# Patient Record
Sex: Female | Born: 1935
Health system: Southern US, Community
[De-identification: ages and names within clinical notes are randomized; demographics above are authoritative.]

## PROBLEM LIST (undated history)

## (undated) DIAGNOSIS — J329 Chronic sinusitis, unspecified: Secondary | ICD-10-CM

## (undated) DIAGNOSIS — M1990 Unspecified osteoarthritis, unspecified site: Secondary | ICD-10-CM

## (undated) DIAGNOSIS — K5900 Constipation, unspecified: Secondary | ICD-10-CM

## (undated) DIAGNOSIS — E785 Hyperlipidemia, unspecified: Secondary | ICD-10-CM

## (undated) DIAGNOSIS — G8929 Other chronic pain: Secondary | ICD-10-CM

## (undated) DIAGNOSIS — Z8619 Personal history of other infectious and parasitic diseases: Secondary | ICD-10-CM

## (undated) DIAGNOSIS — R413 Other amnesia: Secondary | ICD-10-CM

## (undated) DIAGNOSIS — R42 Dizziness and giddiness: Secondary | ICD-10-CM

## (undated) DIAGNOSIS — I10 Essential (primary) hypertension: Secondary | ICD-10-CM

## (undated) DIAGNOSIS — R51 Headache: Secondary | ICD-10-CM

## (undated) DIAGNOSIS — I209 Angina pectoris, unspecified: Secondary | ICD-10-CM

## (undated) DIAGNOSIS — T7840XA Allergy, unspecified, initial encounter: Secondary | ICD-10-CM

## (undated) HISTORY — DX: Other amnesia: R41.3

## (undated) HISTORY — DX: Essential (primary) hypertension: I10

## (undated) HISTORY — DX: Unspecified osteoarthritis, unspecified site: M19.90

## (undated) HISTORY — DX: Headache: R51

## (undated) HISTORY — DX: Constipation, unspecified: K59.00

## (undated) HISTORY — DX: Dizziness and giddiness: R42

## (undated) HISTORY — DX: Hyperlipidemia, unspecified: E78.5

## (undated) HISTORY — DX: Personal history of other infectious and parasitic diseases: Z86.19

## (undated) HISTORY — DX: Allergy, unspecified, initial encounter: T78.40XA

---

## 1968-11-19 HISTORY — PX: ABDOMINAL HYSTERECTOMY: SHX81

## 1998-05-27 ENCOUNTER — Other Ambulatory Visit: Admission: RE | Admit: 1998-05-27 | Discharge: 1998-05-27 | Payer: Self-pay | Admitting: Obstetrics and Gynecology

## 1998-09-14 ENCOUNTER — Ambulatory Visit (HOSPITAL_COMMUNITY): Admission: RE | Admit: 1998-09-14 | Discharge: 1998-09-14 | Payer: Self-pay | Admitting: Internal Medicine

## 1998-09-14 ENCOUNTER — Encounter: Payer: Self-pay | Admitting: Internal Medicine

## 1999-05-31 ENCOUNTER — Other Ambulatory Visit: Admission: RE | Admit: 1999-05-31 | Discharge: 1999-05-31 | Payer: Self-pay | Admitting: Obstetrics and Gynecology

## 2000-05-29 ENCOUNTER — Other Ambulatory Visit: Admission: RE | Admit: 2000-05-29 | Discharge: 2000-05-29 | Payer: Self-pay | Admitting: Internal Medicine

## 2001-06-15 ENCOUNTER — Emergency Department (HOSPITAL_COMMUNITY): Admission: EM | Admit: 2001-06-15 | Discharge: 2001-06-15 | Payer: Self-pay | Admitting: Emergency Medicine

## 2002-02-21 ENCOUNTER — Encounter: Admission: RE | Admit: 2002-02-21 | Discharge: 2002-02-21 | Payer: Self-pay | Admitting: Internal Medicine

## 2002-02-21 ENCOUNTER — Encounter: Payer: Self-pay | Admitting: Internal Medicine

## 2002-10-09 ENCOUNTER — Other Ambulatory Visit: Admission: RE | Admit: 2002-10-09 | Discharge: 2002-10-09 | Payer: Self-pay | Admitting: Internal Medicine

## 2002-12-09 ENCOUNTER — Encounter: Payer: Self-pay | Admitting: Internal Medicine

## 2002-12-09 LAB — HM COLONOSCOPY: HM Colonoscopy: NORMAL

## 2003-02-05 ENCOUNTER — Encounter: Admission: RE | Admit: 2003-02-05 | Discharge: 2003-02-05 | Payer: Self-pay | Admitting: Internal Medicine

## 2003-11-27 ENCOUNTER — Encounter: Payer: Self-pay | Admitting: Internal Medicine

## 2003-12-29 ENCOUNTER — Emergency Department (HOSPITAL_COMMUNITY): Admission: EM | Admit: 2003-12-29 | Discharge: 2003-12-29 | Payer: Self-pay | Admitting: Emergency Medicine

## 2004-01-19 ENCOUNTER — Ambulatory Visit: Admission: RE | Admit: 2004-01-19 | Discharge: 2004-01-19 | Payer: Self-pay | Admitting: Internal Medicine

## 2004-01-29 ENCOUNTER — Ambulatory Visit: Payer: Self-pay | Admitting: Internal Medicine

## 2004-02-02 ENCOUNTER — Ambulatory Visit: Payer: Self-pay | Admitting: Internal Medicine

## 2004-02-06 ENCOUNTER — Encounter: Admission: RE | Admit: 2004-02-06 | Discharge: 2004-03-04 | Payer: Self-pay | Admitting: Internal Medicine

## 2004-04-01 ENCOUNTER — Ambulatory Visit: Payer: Self-pay | Admitting: Internal Medicine

## 2004-04-12 ENCOUNTER — Ambulatory Visit: Payer: Self-pay | Admitting: Internal Medicine

## 2004-05-14 ENCOUNTER — Ambulatory Visit: Payer: Self-pay | Admitting: Internal Medicine

## 2004-07-09 ENCOUNTER — Ambulatory Visit: Payer: Self-pay | Admitting: Internal Medicine

## 2004-07-15 ENCOUNTER — Ambulatory Visit: Payer: Self-pay

## 2004-11-24 ENCOUNTER — Ambulatory Visit: Payer: Self-pay | Admitting: Internal Medicine

## 2004-12-09 ENCOUNTER — Ambulatory Visit: Payer: Self-pay | Admitting: Internal Medicine

## 2004-12-09 ENCOUNTER — Ambulatory Visit: Payer: Self-pay | Admitting: Cardiology

## 2004-12-16 ENCOUNTER — Ambulatory Visit: Payer: Self-pay | Admitting: Internal Medicine

## 2004-12-28 ENCOUNTER — Ambulatory Visit: Payer: Self-pay | Admitting: Internal Medicine

## 2005-02-07 ENCOUNTER — Ambulatory Visit (HOSPITAL_COMMUNITY): Admission: RE | Admit: 2005-02-07 | Discharge: 2005-02-07 | Payer: Self-pay | Admitting: Internal Medicine

## 2005-02-16 ENCOUNTER — Ambulatory Visit: Payer: Self-pay | Admitting: Internal Medicine

## 2005-04-01 ENCOUNTER — Ambulatory Visit (HOSPITAL_COMMUNITY): Admission: RE | Admit: 2005-04-01 | Discharge: 2005-04-01 | Payer: Self-pay | Admitting: Internal Medicine

## 2005-04-01 ENCOUNTER — Ambulatory Visit: Payer: Self-pay | Admitting: Internal Medicine

## 2005-05-02 ENCOUNTER — Ambulatory Visit: Payer: Self-pay | Admitting: Internal Medicine

## 2005-05-05 ENCOUNTER — Ambulatory Visit: Payer: Self-pay | Admitting: Internal Medicine

## 2005-05-13 ENCOUNTER — Ambulatory Visit: Payer: Self-pay | Admitting: Internal Medicine

## 2005-07-25 ENCOUNTER — Encounter: Payer: Self-pay | Admitting: Internal Medicine

## 2005-07-25 ENCOUNTER — Ambulatory Visit: Payer: Self-pay | Admitting: Internal Medicine

## 2005-08-09 ENCOUNTER — Ambulatory Visit: Payer: Self-pay | Admitting: Internal Medicine

## 2005-12-15 ENCOUNTER — Ambulatory Visit: Payer: Self-pay | Admitting: Internal Medicine

## 2005-12-23 ENCOUNTER — Ambulatory Visit: Payer: Self-pay | Admitting: Internal Medicine

## 2006-01-12 ENCOUNTER — Ambulatory Visit: Payer: Self-pay | Admitting: Internal Medicine

## 2006-02-03 ENCOUNTER — Ambulatory Visit: Payer: Self-pay | Admitting: Internal Medicine

## 2006-04-12 ENCOUNTER — Ambulatory Visit: Payer: Self-pay | Admitting: Internal Medicine

## 2006-05-18 ENCOUNTER — Ambulatory Visit: Payer: Self-pay | Admitting: Endocrinology

## 2006-05-19 ENCOUNTER — Encounter: Payer: Self-pay | Admitting: Internal Medicine

## 2006-05-19 ENCOUNTER — Encounter: Admission: RE | Admit: 2006-05-19 | Discharge: 2006-05-19 | Payer: Self-pay | Admitting: Endocrinology

## 2006-06-13 ENCOUNTER — Ambulatory Visit: Payer: Self-pay | Admitting: Gastroenterology

## 2006-07-11 ENCOUNTER — Ambulatory Visit: Payer: Self-pay | Admitting: Internal Medicine

## 2006-07-25 ENCOUNTER — Ambulatory Visit (HOSPITAL_COMMUNITY): Admission: RE | Admit: 2006-07-25 | Discharge: 2006-07-25 | Payer: Self-pay | Admitting: Internal Medicine

## 2006-08-01 ENCOUNTER — Emergency Department (HOSPITAL_COMMUNITY): Admission: EM | Admit: 2006-08-01 | Discharge: 2006-08-01 | Payer: Self-pay | Admitting: Emergency Medicine

## 2006-08-02 ENCOUNTER — Ambulatory Visit (HOSPITAL_COMMUNITY): Admission: RE | Admit: 2006-08-02 | Discharge: 2006-08-02 | Payer: Self-pay | Admitting: Emergency Medicine

## 2006-09-08 ENCOUNTER — Ambulatory Visit: Payer: Self-pay | Admitting: Internal Medicine

## 2006-09-11 ENCOUNTER — Ambulatory Visit: Payer: Self-pay | Admitting: Internal Medicine

## 2006-10-07 DIAGNOSIS — I10 Essential (primary) hypertension: Secondary | ICD-10-CM

## 2006-10-07 DIAGNOSIS — E785 Hyperlipidemia, unspecified: Secondary | ICD-10-CM | POA: Insufficient documentation

## 2006-10-07 DIAGNOSIS — M25559 Pain in unspecified hip: Secondary | ICD-10-CM

## 2006-10-12 ENCOUNTER — Ambulatory Visit: Payer: Self-pay | Admitting: Internal Medicine

## 2006-11-15 LAB — CONVERTED CEMR LAB: Pap Smear: NORMAL

## 2007-01-02 ENCOUNTER — Ambulatory Visit: Payer: Self-pay | Admitting: Internal Medicine

## 2007-01-09 ENCOUNTER — Encounter: Payer: Self-pay | Admitting: Internal Medicine

## 2007-01-09 ENCOUNTER — Ambulatory Visit: Payer: Self-pay | Admitting: Internal Medicine

## 2007-01-17 ENCOUNTER — Encounter: Payer: Self-pay | Admitting: Internal Medicine

## 2007-01-17 ENCOUNTER — Ambulatory Visit: Payer: Self-pay

## 2007-03-13 ENCOUNTER — Ambulatory Visit: Payer: Self-pay | Admitting: Internal Medicine

## 2007-03-13 DIAGNOSIS — M1990 Unspecified osteoarthritis, unspecified site: Secondary | ICD-10-CM

## 2007-07-11 ENCOUNTER — Ambulatory Visit: Payer: Self-pay | Admitting: Internal Medicine

## 2007-07-11 DIAGNOSIS — R1032 Left lower quadrant pain: Secondary | ICD-10-CM

## 2007-07-11 LAB — CONVERTED CEMR LAB
ALT: 16 units/L (ref 0–35)
Albumin: 3.8 g/dL (ref 3.5–5.2)
Bilirubin, Direct: 0.1 mg/dL (ref 0.0–0.3)
Calcium: 9.5 mg/dL (ref 8.4–10.5)
Chloride: 105 meq/L (ref 96–112)
Eosinophils Absolute: 0.2 10*3/uL (ref 0.0–0.7)
GFR calc Af Amer: 70 mL/min
GFR calc non Af Amer: 58 mL/min
HCT: 39.8 % (ref 36.0–46.0)
Hemoglobin: 13.6 g/dL (ref 12.0–15.0)
MCHC: 34 g/dL (ref 30.0–36.0)
MCV: 90.2 fL (ref 78.0–100.0)
Monocytes Absolute: 0.4 10*3/uL (ref 0.1–1.0)
Monocytes Relative: 8.6 % (ref 3.0–12.0)
Neutrophils Relative %: 56.6 % (ref 43.0–77.0)
Platelets: 271 10*3/uL (ref 150–400)
Potassium: 4.4 meq/L (ref 3.5–5.1)
Total Protein, Urine: NEGATIVE mg/dL
Urine Glucose: NEGATIVE mg/dL
pH: 6 (ref 5.0–8.0)

## 2007-07-13 ENCOUNTER — Ambulatory Visit: Payer: Self-pay | Admitting: Internal Medicine

## 2007-08-08 ENCOUNTER — Ambulatory Visit: Payer: Self-pay | Admitting: Internal Medicine

## 2007-09-10 ENCOUNTER — Encounter: Admission: RE | Admit: 2007-09-10 | Discharge: 2007-09-10 | Payer: Self-pay | Admitting: Obstetrics and Gynecology

## 2007-10-02 ENCOUNTER — Ambulatory Visit: Payer: Self-pay | Admitting: Internal Medicine

## 2007-10-09 ENCOUNTER — Ambulatory Visit: Payer: Self-pay | Admitting: Internal Medicine

## 2007-10-09 LAB — CONVERTED CEMR LAB
CO2: 27 meq/L (ref 19–32)
CRP, High Sensitivity: 5 (ref 0.00–5.00)
Calcium: 9.3 mg/dL (ref 8.4–10.5)
Cholesterol: 276 mg/dL (ref 0–200)
Creatinine, Ser: 1.1 mg/dL (ref 0.4–1.2)
Direct LDL: 175.9 mg/dL
GFR calc Af Amer: 63 mL/min
GFR calc non Af Amer: 52 mL/min
Glucose, Bld: 97 mg/dL (ref 70–99)
VLDL: 63 mg/dL — ABNORMAL HIGH (ref 0–40)

## 2007-10-10 ENCOUNTER — Telehealth: Payer: Self-pay | Admitting: Internal Medicine

## 2007-10-24 ENCOUNTER — Ambulatory Visit: Payer: Self-pay | Admitting: Internal Medicine

## 2007-11-27 ENCOUNTER — Ambulatory Visit: Payer: Self-pay | Admitting: Internal Medicine

## 2007-12-06 ENCOUNTER — Ambulatory Visit: Payer: Self-pay | Admitting: Internal Medicine

## 2007-12-26 LAB — CONVERTED CEMR LAB

## 2008-01-01 ENCOUNTER — Ambulatory Visit: Payer: Self-pay | Admitting: Internal Medicine

## 2008-01-01 LAB — CONVERTED CEMR LAB
ALT: 17 units/L (ref 0–35)
AST: 23 units/L (ref 0–37)
Total CHOL/HDL Ratio: 2.6
VLDL: 28 mg/dL (ref 0–40)

## 2008-01-08 ENCOUNTER — Ambulatory Visit: Payer: Self-pay | Admitting: Internal Medicine

## 2008-01-14 ENCOUNTER — Ambulatory Visit: Payer: Self-pay | Admitting: Internal Medicine

## 2008-01-23 ENCOUNTER — Encounter: Payer: Self-pay | Admitting: Internal Medicine

## 2008-01-30 ENCOUNTER — Ambulatory Visit: Payer: Self-pay | Admitting: *Deleted

## 2008-01-30 DIAGNOSIS — A088 Other specified intestinal infections: Secondary | ICD-10-CM

## 2008-01-30 DIAGNOSIS — L259 Unspecified contact dermatitis, unspecified cause: Secondary | ICD-10-CM

## 2008-02-04 ENCOUNTER — Ambulatory Visit: Payer: Self-pay | Admitting: Internal Medicine

## 2008-02-04 DIAGNOSIS — L738 Other specified follicular disorders: Secondary | ICD-10-CM | POA: Insufficient documentation

## 2008-02-04 DIAGNOSIS — R110 Nausea: Secondary | ICD-10-CM

## 2008-02-04 DIAGNOSIS — H9209 Otalgia, unspecified ear: Secondary | ICD-10-CM | POA: Insufficient documentation

## 2008-02-04 LAB — CONVERTED CEMR LAB
ALT: 20 units/L (ref 0–35)
AST: 24 units/L (ref 0–37)
Alkaline Phosphatase: 77 units/L (ref 39–117)
Basophils Absolute: 0.1 10*3/uL (ref 0.0–0.1)
Basophils Relative: 2.2 % (ref 0.0–3.0)
Chloride: 104 meq/L (ref 96–112)
Eosinophils Absolute: 0.2 10*3/uL (ref 0.0–0.7)
Eosinophils Relative: 2.7 % (ref 0.0–5.0)
Monocytes Absolute: 0.7 10*3/uL (ref 0.1–1.0)
Monocytes Relative: 10 % (ref 3.0–12.0)
Platelets: 250 10*3/uL (ref 150–400)
Potassium: 4.7 meq/L (ref 3.5–5.1)
Sodium: 139 meq/L (ref 135–145)
WBC: 6.8 10*3/uL (ref 4.5–10.5)

## 2008-02-05 ENCOUNTER — Telehealth: Payer: Self-pay | Admitting: Internal Medicine

## 2008-04-10 ENCOUNTER — Ambulatory Visit: Payer: Self-pay | Admitting: Internal Medicine

## 2008-04-10 DIAGNOSIS — N951 Menopausal and female climacteric states: Secondary | ICD-10-CM

## 2008-05-19 LAB — CONVERTED CEMR LAB

## 2008-06-03 ENCOUNTER — Ambulatory Visit: Payer: Self-pay | Admitting: Internal Medicine

## 2008-06-03 DIAGNOSIS — R42 Dizziness and giddiness: Secondary | ICD-10-CM | POA: Insufficient documentation

## 2008-06-11 ENCOUNTER — Ambulatory Visit: Payer: Self-pay | Admitting: Internal Medicine

## 2008-06-24 ENCOUNTER — Ambulatory Visit: Payer: Self-pay | Admitting: Internal Medicine

## 2008-06-26 ENCOUNTER — Encounter: Admission: RE | Admit: 2008-06-26 | Discharge: 2008-09-24 | Payer: Self-pay | Admitting: Internal Medicine

## 2008-07-02 ENCOUNTER — Telehealth: Payer: Self-pay | Admitting: Internal Medicine

## 2008-07-02 ENCOUNTER — Ambulatory Visit (HOSPITAL_BASED_OUTPATIENT_CLINIC_OR_DEPARTMENT_OTHER): Admission: RE | Admit: 2008-07-02 | Discharge: 2008-07-02 | Payer: Self-pay | Admitting: Internal Medicine

## 2008-07-02 ENCOUNTER — Ambulatory Visit: Payer: Self-pay | Admitting: Diagnostic Radiology

## 2008-07-03 ENCOUNTER — Encounter: Payer: Self-pay | Admitting: Internal Medicine

## 2008-07-04 ENCOUNTER — Encounter: Payer: Self-pay | Admitting: Internal Medicine

## 2008-07-07 ENCOUNTER — Encounter: Payer: Self-pay | Admitting: Internal Medicine

## 2008-07-22 ENCOUNTER — Ambulatory Visit: Payer: Self-pay | Admitting: Internal Medicine

## 2008-09-04 ENCOUNTER — Ambulatory Visit: Payer: Self-pay | Admitting: Internal Medicine

## 2008-09-04 ENCOUNTER — Ambulatory Visit: Payer: Self-pay | Admitting: Diagnostic Radiology

## 2008-09-04 ENCOUNTER — Ambulatory Visit (HOSPITAL_BASED_OUTPATIENT_CLINIC_OR_DEPARTMENT_OTHER): Admission: RE | Admit: 2008-09-04 | Discharge: 2008-09-04 | Payer: Self-pay | Admitting: Internal Medicine

## 2008-09-04 DIAGNOSIS — J018 Other acute sinusitis: Secondary | ICD-10-CM

## 2008-10-16 ENCOUNTER — Ambulatory Visit: Payer: Self-pay | Admitting: Internal Medicine

## 2008-10-16 LAB — CONVERTED CEMR LAB
AST: 28 units/L (ref 0–37)
HDL: 66.8 mg/dL (ref >39.00)
LDL Cholesterol: 79 mg/dL (ref 0–99)
Vit D, 25-Hydroxy: 22 ng/mL — ABNORMAL LOW (ref 30–89)

## 2008-10-17 ENCOUNTER — Telehealth: Payer: Self-pay | Admitting: Internal Medicine

## 2008-12-05 ENCOUNTER — Encounter: Payer: Self-pay | Admitting: Internal Medicine

## 2008-12-12 ENCOUNTER — Encounter: Admission: RE | Admit: 2008-12-12 | Discharge: 2008-12-12 | Payer: Self-pay | Admitting: Internal Medicine

## 2008-12-12 LAB — HM MAMMOGRAPHY: HM Mammogram: NORMAL

## 2008-12-22 ENCOUNTER — Ambulatory Visit: Payer: Self-pay | Admitting: Internal Medicine

## 2008-12-22 DIAGNOSIS — M79609 Pain in unspecified limb: Secondary | ICD-10-CM

## 2009-01-08 ENCOUNTER — Encounter: Admission: RE | Admit: 2009-01-08 | Discharge: 2009-02-27 | Payer: Self-pay | Admitting: Internal Medicine

## 2009-01-19 ENCOUNTER — Encounter: Payer: Self-pay | Admitting: Internal Medicine

## 2009-02-05 ENCOUNTER — Encounter: Payer: Self-pay | Admitting: Internal Medicine

## 2009-03-27 ENCOUNTER — Telehealth: Payer: Self-pay | Admitting: Internal Medicine

## 2009-04-30 ENCOUNTER — Ambulatory Visit: Payer: Self-pay | Admitting: Internal Medicine

## 2009-05-06 ENCOUNTER — Telehealth: Payer: Self-pay | Admitting: Internal Medicine

## 2009-05-25 ENCOUNTER — Ambulatory Visit: Payer: Self-pay | Admitting: Internal Medicine

## 2009-05-25 DIAGNOSIS — R5381 Other malaise: Secondary | ICD-10-CM | POA: Insufficient documentation

## 2009-05-25 DIAGNOSIS — R5383 Other fatigue: Secondary | ICD-10-CM

## 2009-05-25 LAB — CONVERTED CEMR LAB
ALT: 13 units/L (ref 0–35)
BUN: 18 mg/dL (ref 6–23)
Basophils Absolute: 0 10*3/uL (ref 0.0–0.1)
Basophils Relative: 1 % (ref 0–1)
Bilirubin, Direct: 0.1 mg/dL (ref 0.0–0.3)
Eosinophils Absolute: 0.2 10*3/uL (ref 0.0–0.7)
Glucose, Bld: 111 mg/dL — ABNORMAL HIGH (ref 70–99)
HCT: 42.8 % (ref 36.0–46.0)
Hemoglobin: 14 g/dL (ref 12.0–15.0)
Indirect Bilirubin: 0.3 mg/dL (ref 0.0–0.9)
Lymphs Abs: 1.8 10*3/uL (ref 0.7–4.0)
MCHC: 32.7 g/dL (ref 30.0–36.0)
Monocytes Relative: 9 % (ref 3–12)
Platelets: 260 10*3/uL (ref 150–400)
RDW: 13 % (ref 11.5–15.5)
Sodium: 140 meq/L (ref 135–145)
TSH: 2.681 microintl units/mL (ref 0.350–4.500)
Total Bilirubin: 0.4 mg/dL (ref 0.3–1.2)
WBC: 6.3 10*3/uL (ref 4.0–10.5)

## 2009-05-26 ENCOUNTER — Telehealth: Payer: Self-pay | Admitting: Internal Medicine

## 2009-05-26 ENCOUNTER — Encounter: Payer: Self-pay | Admitting: Internal Medicine

## 2009-06-15 ENCOUNTER — Ambulatory Visit: Payer: Self-pay | Admitting: Internal Medicine

## 2009-06-29 ENCOUNTER — Encounter: Admission: RE | Admit: 2009-06-29 | Discharge: 2009-07-21 | Payer: Self-pay | Admitting: Internal Medicine

## 2009-07-15 ENCOUNTER — Encounter: Payer: Self-pay | Admitting: Internal Medicine

## 2009-07-16 ENCOUNTER — Ambulatory Visit: Payer: Self-pay | Admitting: Internal Medicine

## 2009-07-16 DIAGNOSIS — R0609 Other forms of dyspnea: Secondary | ICD-10-CM | POA: Insufficient documentation

## 2009-07-16 DIAGNOSIS — R0989 Other specified symptoms and signs involving the circulatory and respiratory systems: Secondary | ICD-10-CM

## 2009-07-20 ENCOUNTER — Encounter: Payer: Self-pay | Admitting: Internal Medicine

## 2009-07-27 LAB — CONVERTED CEMR LAB: Pap Smear: NORMAL

## 2009-07-29 ENCOUNTER — Encounter: Payer: Self-pay | Admitting: Internal Medicine

## 2009-08-10 ENCOUNTER — Ambulatory Visit: Payer: Self-pay | Admitting: Cardiology

## 2009-08-10 ENCOUNTER — Telehealth: Payer: Self-pay | Admitting: Internal Medicine

## 2009-08-10 ENCOUNTER — Encounter: Payer: Self-pay | Admitting: Internal Medicine

## 2009-08-10 ENCOUNTER — Ambulatory Visit (HOSPITAL_COMMUNITY): Admission: RE | Admit: 2009-08-10 | Discharge: 2009-08-10 | Payer: Self-pay | Admitting: Internal Medicine

## 2009-08-10 ENCOUNTER — Ambulatory Visit: Payer: Self-pay

## 2009-08-24 ENCOUNTER — Ambulatory Visit: Payer: Self-pay | Admitting: Internal Medicine

## 2009-09-30 ENCOUNTER — Ambulatory Visit: Payer: Self-pay | Admitting: Internal Medicine

## 2009-09-30 DIAGNOSIS — H5310 Unspecified subjective visual disturbances: Secondary | ICD-10-CM | POA: Insufficient documentation

## 2009-10-05 ENCOUNTER — Telehealth: Payer: Self-pay | Admitting: Internal Medicine

## 2009-11-17 ENCOUNTER — Encounter: Payer: Self-pay | Admitting: Internal Medicine

## 2010-02-22 ENCOUNTER — Ambulatory Visit: Payer: Self-pay | Admitting: Internal Medicine

## 2010-02-22 ENCOUNTER — Encounter: Payer: Self-pay | Admitting: Internal Medicine

## 2010-02-22 DIAGNOSIS — R0789 Other chest pain: Secondary | ICD-10-CM | POA: Insufficient documentation

## 2010-03-30 ENCOUNTER — Telehealth: Payer: Self-pay | Admitting: Internal Medicine

## 2010-04-01 ENCOUNTER — Ambulatory Visit
Admission: RE | Admit: 2010-04-01 | Discharge: 2010-04-01 | Payer: Self-pay | Source: Home / Self Care | Attending: Internal Medicine | Admitting: Internal Medicine

## 2010-04-01 ENCOUNTER — Encounter: Payer: Self-pay | Admitting: Internal Medicine

## 2010-04-01 DIAGNOSIS — R7309 Other abnormal glucose: Secondary | ICD-10-CM | POA: Insufficient documentation

## 2010-04-01 DIAGNOSIS — S335XXA Sprain of ligaments of lumbar spine, initial encounter: Secondary | ICD-10-CM | POA: Insufficient documentation

## 2010-04-01 DIAGNOSIS — R635 Abnormal weight gain: Secondary | ICD-10-CM | POA: Insufficient documentation

## 2010-04-01 LAB — CONVERTED CEMR LAB
ALT: 12 units/L (ref 0–35)
Albumin: 3.8 g/dL (ref 3.5–5.2)
BUN: 17 mg/dL (ref 6–23)
Chloride: 105 meq/L (ref 96–112)
Cholesterol: 265 mg/dL — ABNORMAL HIGH (ref 0–200)
HDL: 63 mg/dL (ref >39)
LDL Cholesterol: 156 mg/dL — ABNORMAL HIGH (ref 0–99)
Potassium: 4.4 meq/L (ref 3.5–5.3)
Sodium: 141 meq/L (ref 135–145)
TSH: 2.04 microintl units/mL (ref 0.350–4.500)
Total CHOL/HDL Ratio: 4.2
Total Protein: 7.4 g/dL (ref 6.0–8.3)
Triglycerides: 228 mg/dL — ABNORMAL HIGH (ref <150)
VLDL: 46 mg/dL — ABNORMAL HIGH (ref 0–40)

## 2010-04-05 ENCOUNTER — Telehealth: Payer: Self-pay | Admitting: Internal Medicine

## 2010-04-08 ENCOUNTER — Emergency Department (HOSPITAL_COMMUNITY)
Admission: EM | Admit: 2010-04-08 | Discharge: 2010-04-08 | Payer: Self-pay | Source: Home / Self Care | Admitting: Emergency Medicine

## 2010-04-10 ENCOUNTER — Encounter: Payer: Self-pay | Admitting: Internal Medicine

## 2010-04-11 ENCOUNTER — Encounter: Payer: Self-pay | Admitting: Emergency Medicine

## 2010-04-12 ENCOUNTER — Encounter: Payer: Self-pay | Admitting: Internal Medicine

## 2010-04-12 LAB — URINALYSIS, ROUTINE W REFLEX MICROSCOPIC
Bilirubin Urine: NEGATIVE
Hgb urine dipstick: NEGATIVE
Ketones, ur: NEGATIVE mg/dL
Urine Glucose, Fasting: NEGATIVE mg/dL
pH: 5 (ref 5.0–8.0)

## 2010-04-12 LAB — DIFFERENTIAL
Basophils Absolute: 0 10*3/uL (ref 0.0–0.1)
Basophils Relative: 0 % (ref 0–1)
Lymphocytes Relative: 42 % (ref 12–46)
Monocytes Absolute: 0.7 10*3/uL (ref 0.1–1.0)
Monocytes Relative: 10 % (ref 3–12)
Neutro Abs: 2.9 10*3/uL (ref 1.7–7.7)

## 2010-04-12 LAB — CBC
Hemoglobin: 12.5 g/dL (ref 12.0–15.0)
Platelets: 219 10*3/uL (ref 150–400)
RBC: 4.19 MIL/uL (ref 3.87–5.11)
RDW: 12.8 % (ref 11.5–15.5)

## 2010-04-12 LAB — BASIC METABOLIC PANEL
CO2: 23 mEq/L (ref 19–32)
Chloride: 103 mEq/L (ref 96–112)
GFR calc Af Amer: 58 mL/min — ABNORMAL LOW (ref >60)

## 2010-04-20 NOTE — Progress Notes (Signed)
  Phone Note Outgoing Call   Summary of Call: call pt - labs normal.   I will send letter summarizing results Initial call taken by: D. Thomos Lemons DO,  May 26, 2009 9:48 AM  Follow-up for Phone Call        informed patient that her blood work is normal. Pt. is still wondering what is making her have NO energy and feel SO drained? Call pt. back  Follow-up by: Michaelle Copas,  May 28, 2009 10:03 AM  Additional Follow-up for Phone Call Additional follow up Details #1::        symptoms may be from resolving viral infection.  If persistent fatigue after 2 wks, I suggest f/u OV Additional Follow-up by: D. Thomos Lemons DO,  June 01, 2009 8:23 AM    Additional Follow-up for Phone Call Additional follow up Details #2::    informed pt. that she could have no energy from her resolving viral infection and that if she is not feeling better in 2 weeks to come back in for a ov. Follow-up by: Michaelle Copas,  June 01, 2009 8:51 AM

## 2010-04-20 NOTE — Assessment & Plan Note (Signed)
Summary: dizzy ringing in ears/mhf   Vital Signs:  Patient profile:   77 year old female Weight:      164.50 pounds BMI:     29.25 O2 Sat:      100 % on Room air Temp:     97.3 degrees F oral Pulse rate:   60 / minute Pulse rhythm:   regular Resp:     18 per minute BP sitting:   140 / 70  (right arm) BP standing:   130 / 70  (right arm) Cuff size:   regular  Vitals Entered By: Glendell Docker CMA (June 15, 2009 3:45 PM)  O2 Flow:  Room air CC: Rm 3- Unresolved Dizziness and ringing in ears, dizziness in for initial evaluation Comments dizziness, nause and vomiting, ringing in ears, off balance, and no energy   Primary Care Provider:  DThomos Lemons DO  CC:  Rm 3- Unresolved Dizziness and ringing in ears and dizziness in for initial evaluation.  History of Present Illness: 2 days of dizzines,  bilateral ringing,  feels off balance       This is a 77 year old female who presents with dizziness x 2 days. .  She c/o izziness, ringing in ears, and imbalance.  The episodes of dizziness occur several times a day.  When the episodes occur, they last for several minutes.  The dizzness is associated with nausea, vomiting, and unsteady gait.   Her symptoms worse with head movement.   Evaluation and treatment to date has included ENT consult - Dr. Lazarus Salines.  She was prev also seen by vest rehab.  Dr. Lazarus Salines thought BP meds were contributing to dizziness  MRI of brain 07/02/2008 - Age related atrophy and mild chronic small vessel change.  No sign of acute or reversible process.  Allergies: 1)  ! Ampicillin 2)  Benicar (Olmesartan Medoxomil) 3)  Vicodin (Hydrocodone-Acetaminophen)  Past History:  Past Medical History: Hyperlipidemia Hypertension   Osteoarthritis         Vertigo  GYN Dr Payton Doughty Colon 2004 Dr Jarold Motto - nl     Past Surgical History: Hysterectomy, partial  COLONOSCOPY              Family History: F MM Family History Hypertension  Family History Lung cancer   M          Social History: Single - widow  Never Smoked     Alcohol use-no      Occupation: working for senior care  and personal care asst     Physical Exam  General:  alert, well-developed, and well-nourished.   Head:  normocephalic and atraumatic.   Eyes:  pupils equal, pupils round, and pupils reactive to light.  no nystagmus Lungs:  normal respiratory effort and normal breath sounds.   Heart:  normal rate, regular rhythm, no murmur, and no gallop.   Neurologic:  cranial nerves II-XII intact, gait normal, and finger-to-nose normal.     Impression & Recommendations:  Problem # 1:  INTERMITTENT VERTIGO (ICD-780.4) 77 y/o with intermittent vertigo.  symptoms worse with movement.  I suspect positional vertigo.  her BP meds could be contributing.  hold bystolic.  use low dose valium as needed.  increase fluid intake.  refer back to vestibular rehab.  If refractory symptoms, refer to ENT  Her updated medication list for this problem includes:    Ondansetron Hcl 4 Mg Tabs (Ondansetron hcl) ..... One by mouth two times a day as needed nausea  Orders: Misc. Referral (Misc. Ref)  Complete Medication List: 1)  Glucosamine 500 Mg Caps (Glucosamine sulfate) .Marland Kitchen.. 1 by mouth once daily 2)  Adult Aspirin Ec Low Strength 81 Mg Tbec (Aspirin) .Marland Kitchen.. 1 by mouth once daily 3)  Crestor 10 Mg Tabs (Rosuvastatin calcium) .... 1/2 tablets by mouth once daily 4)  Vitamin D 1000 Unit Caps (Cholecalciferol) .... Two by mouth qd 5)  Bystolic 5 Mg Tabs (Nebivolol hcl) .... Take 1/2  tablet by mouth once a day (hold) 6)  Vitamin C  .... 1 by mouth qd 7)  Ondansetron Hcl 4 Mg Tabs (Ondansetron hcl) .... One by mouth two times a day as needed nausea 8)  Fluticasone Propionate 50 Mcg/act Susp (Fluticasone propionate) .... 2 sprays each nostril once daily 9)  B Complex-b12 Tabs (B complex vitamins) .... Take 1 tablet by mouth once a day 10)  Diazepam 2 Mg Tabs (Diazepam) .... One by mouth two times a day as  needed for dizziness  Patient Instructions: 1)  Please schedule a follow-up appointment in 1 month. 2)  Increase fluid intake - 6 glasses of water 3)  Hold Bystolic Prescriptions: DIAZEPAM 2 MG TABS (DIAZEPAM) one by mouth two times a day as needed for dizziness  #30 x 1   Entered and Authorized by:   D. Thomos Lemons DO   Signed by:   D. Thomos Lemons DO on 06/15/2009   Method used:   Print then Give to Patient   RxID:   [id number]   Current Allergies (reviewed today): ! AMPICILLIN BENICAR (OLMESARTAN MEDOXOMIL) VICODIN (HYDROCODONE-ACETAMINOPHEN)

## 2010-04-20 NOTE — Miscellaneous (Signed)
Summary: PT Initial Summary/[facility]  PT Initial [facility]   Imported By: Lanelle Bal 08/24/2009 11:54:50  _____________________________________________________________________  External Attachment:    Type:   Image     Comment:   External Document

## 2010-04-20 NOTE — Assessment & Plan Note (Signed)
Summary: CONGESTION/SINUS/HEA   Vital Signs:  Patient profile:   77 year old female Height:      63 inches Weight:      164.75 pounds BMI:     29.29 O2 Sat:      99 % on Room air Temp:     98.0 degrees F oral Pulse rate:   76 / minute BP sitting:   128 / 76  (right arm)  Vitals Entered By: Lucious Groves (April 30, 2009 11:01 AM)  O2 Flow:  Room air CC: C/O sinus congestion x 3 weeks, and OTC medication has not helped. Pt has had a sour stomach and nausea, but denies vomiting./kb   Primary Care Provider:  DThomos Lemons DO  CC:  C/O sinus congestion x 3 weeks, and OTC medication has not helped. Pt has had a sour stomach and nausea, and but denies vomiting./kb.  History of Present Illness: 77 y/o reports getting cold x 3 weeks.   she has some left sided sinus congestion no purulent drainage.  secretions are clear no fever she has been swallowing a lot of mucus feels nauseated using OTC tylenel and decogestant    Current Medications (verified): 1)  Glucosamine 500 Mg  Caps (Glucosamine Sulfate) .Marland Kitchen.. 1 By Mouth Once Daily 2)  Adult Aspirin Ec Low Strength 81 Mg  Tbec (Aspirin) .Marland Kitchen.. 1 By Mouth Once Daily 3)  Crestor 10 Mg  Tabs (Rosuvastatin Calcium) .... One By Mouth Once Daily 4)  Vitamin D 1000 Unit  Caps (Cholecalciferol) .... Two By Mouth Qd 5)  Bystolic 5 Mg Tabs (Nebivolol Hcl) .... Take 1/2  Tablet By Mouth Once A Day 6)  Vitamin C .... 1 By Mouth Qd 7)  Vitamin E .... 1 By Mouth Qd  Allergies (verified): 1)  ! Ampicillin 2)  Benicar (Olmesartan Medoxomil) 3)  Vicodin (Hydrocodone-Acetaminophen)  Past History:  Past Medical History: Hyperlipidemia Hypertension  Osteoarthritis          GYN Dr Payton Doughty Colon 2004 Dr Jarold Motto - nl    Past Surgical History: Hysterectomy, partial  COLONOSCOPY            Family History: F MM Family History Hypertension  Family History Lung cancer  M        Social History: Single - widow Never Smoked    Alcohol  use-no      Occupation: working for senior care  and personal care asst     Review of Systems       intermittent dizziness  Physical Exam  General:  alert, well-developed, and well-nourished.   Ears:  R ear normal and L ear normal.   Mouth:  postnasal drip.   Lungs:  normal respiratory effort, normal breath sounds, no crackles, and no wheezes.   Heart:  normal rate, regular rhythm, and no gallop.   Abdomen:  soft, non-tender, and no masses.     Impression & Recommendations:  Problem # 1:  NAUSEA (ICD-787.02) Pt reports getting URI 2-3 weeks ago.  She notes post nasal gtt.  secretions are clear.  I suspect nausea from post nasal gtt.  Use flonase and neil med sinus rinse.  Patient advised to call office if symptoms persist or worsen.  Her updated medication list for this problem includes:    Ondansetron Hcl 4 Mg Tabs (Ondansetron hcl) ..... One by mouth two times a day as needed nausea  Complete Medication List: 1)  Glucosamine 500 Mg Caps (Glucosamine sulfate) .Marland Kitchen.. 1 by mouth once  daily 2)  Adult Aspirin Ec Low Strength 81 Mg Tbec (Aspirin) .Marland Kitchen.. 1 by mouth once daily 3)  Crestor 10 Mg Tabs (Rosuvastatin calcium) .... One by mouth once daily 4)  Vitamin D 1000 Unit Caps (Cholecalciferol) .... Two by mouth qd 5)  Bystolic 5 Mg Tabs (Nebivolol hcl) .... Take 1/2  tablet by mouth once a day 6)  Vitamin C  .... 1 by mouth qd 7)  Ondansetron Hcl 4 Mg Tabs (Ondansetron hcl) .... One by mouth two times a day as needed nausea 8)  Fluticasone Propionate 50 Mcg/act Susp (Fluticasone propionate) .... 2 sprays each nostril once daily  Patient Instructions: 1)  Use neil med sinus rinse over the counter. 2)  Call our office if your symptoms do not  improve or gets worse. Prescriptions: FLUTICASONE PROPIONATE 50 MCG/ACT SUSP (FLUTICASONE PROPIONATE) 2 sprays each nostril once daily  #1 x 2   Entered and Authorized by:   D. Thomos Lemons DO   Signed by:   D. Thomos Lemons DO on 04/30/2009   Method  used:   Electronically to        [facility] [phone]* (retail)       [address]       [place], [place]  [address]       Ph: [phone]       Fax: [phone]   RxID:   [id number] ONDANSETRON HCL 4 MG TABS (ONDANSETRON HCL) one by mouth two times a day as needed nausea  #20 x 0   Entered and Authorized by:   D. Thomos Lemons DO   Signed by:   D. Thomos Lemons DO on 04/30/2009   Method used:   Electronically to        [facility] [phone]* (retail)       [address]       [place], [place]  [address]       Ph: [phone]       Fax: [phone]   RxID:   [id number]

## 2010-04-20 NOTE — Consult Note (Signed)
Summary: [facility]   Imported By: Lanelle Bal 10/16/2009 14:12:35  _____________________________________________________________________  External Attachment:    Type:   Image     Comment:   External Document

## 2010-04-20 NOTE — Progress Notes (Signed)
Summary: Crestor Refill  Phone Note Refill Request Call back at Home Phone [phone] Message from:  Patient on March 27, 2009 8:27 AM  Refills Requested: Medication #1:  CRESTOR 10 MG  TABS one by mouth once daily patient called requesting a rx refill for Crestor. If approved she is requesting rx sent to pharmacy on file   Method Requested: Electronic Next Appointment Scheduled: No future appointments on file Initial call taken by: Glendell Docker CMA,  March 27, 2009 8:27 AM Caller: Patient    Prescriptions: CRESTOR 10 MG  TABS (ROSUVASTATIN CALCIUM) one by mouth once daily  #30 x 5   Entered by:   Glendell Docker CMA   Authorized by:   D. Thomos Lemons DO   Signed by:   Glendell Docker CMA on 03/27/2009   Method used:   Electronically to        [facility] [phone]* (retail)       [address]       [place], [place]  [address]       Ph: [phone]       Fax: [phone]   RxID:   [id number]

## 2010-04-20 NOTE — Miscellaneous (Signed)
Summary: Flu/[facility]  Flu/[facility]   Imported By: Lanelle Bal 11/27/2009 11:02:12  _____________________________________________________________________  External Attachment:    Type:   Image     Comment:   External Document

## 2010-04-20 NOTE — Assessment & Plan Note (Signed)
Summary: weak & No energy- jr   Vital Signs:  Patient profile:   77 year old female Weight:      164.25 pounds BMI:     29.20 O2 Sat:      99 % on Room air Temp:     97.5 degrees F oral Pulse rate:   60 / minute Pulse rhythm:   regular Resp:     18 per minute BP sitting:   130 / 70  (right arm) Cuff size:   regular  Vitals Entered By: Glendell Docker CMA (May 25, 2009 8:02 AM)  O2 Flow:  Room air CC: Rm 2-Dizziness & off balance Comments c/o  random dizziness last week in January she had a eye exam left rib side pain  for the past 2 weeks,    Primary Care Provider:  Dondra Spry DO  CC:  Rm 2-Dizziness & off balance.  History of Present Illness: fatigue and lack of energy x 2 wks.  feels like she has virus.  no chest pain she has chronic intermittent dizziness related to BPV  no abd pain. no diarrhea no urinary symptoms intermittent left flank pain  htn - stable  Allergies: 1)  ! Ampicillin 2)  Benicar (Olmesartan Medoxomil) 3)  Vicodin (Hydrocodone-Acetaminophen)  Past History:  Past Medical History: Hyperlipidemia Hypertension  Osteoarthritis          GYN Dr Payton Doughty Colon 2004 Dr Jarold Motto - nl     Past Surgical History: Hysterectomy, partial  COLONOSCOPY             Family History: F MM Family History Hypertension  Family History Lung cancer  M         Social History: Single - widow  Never Smoked    Alcohol use-no      Occupation: working for senior care  and personal care asst     Physical Exam  General:  alert, well-developed, and well-nourished.   Ears:  R ear normal and L ear normal.   Mouth:  Oral mucosa and oropharynx without lesions or exudates.   Neck:  supple, no masses, and no neck tenderness.   Lungs:  normal respiratory effort, normal breath sounds, no crackles, and no wheezes.   Heart:  normal rate, regular rhythm, no murmur, and no gallop.   Abdomen:  soft, non-tender, normal bowel sounds, and no masses.   Extremities:   No lower extremity edema  Neurologic:  cranial nerves II-XII intact and gait normal.     Impression & Recommendations:  Problem # 1:  MALAISE AND FATIGUE (ICD-780.79)  fatigue x 2 weeks.  feels like she has "virus".  left sided flank pain.  u/a is negative.   no orthostasis. get plently of rest and increase fluids.   Orders: T-Basic Metabolic Panel [phone]) T-CBC w/Diff [id number]) T-TSH [phone]) T-Hepatic Function [phone])  Problem # 2:  HYPERLIPIDEMIA (ICD-272.4) unclear if statin related to fatigue.  pt will try taking 1/2 dose of crestor.  Her updated medication list for this problem includes:    Crestor 10 Mg Tabs (Rosuvastatin calcium) ..... One by mouth once daily  Orders: T-Hepatic Function [phone])  Problem # 3:  HYPERTENSION (ICD-401.9) stable.  Maintain current medication regimen.  Her updated medication list for this problem includes:    Bystolic 5 Mg Tabs (Nebivolol hcl) .Marland Kitchen... Take 1/2  tablet by mouth once a day  BP today: 130/70 Prior BP: 128/76 (04/30/2009)  Labs Reviewed: K+: 4.7 (02/04/2008) Creat: : 1.0 (02/04/2008)  Chol: 185 (10/16/2008)   HDL: 66.80 (10/16/2008)   LDL: 79 (10/16/2008)   TG: 196.0 (10/16/2008)  Complete Medication List: 1)  Glucosamine 500 Mg Caps (Glucosamine sulfate) .Marland Kitchen.. 1 by mouth once daily 2)  Adult Aspirin Ec Low Strength 81 Mg Tbec (Aspirin) .Marland Kitchen.. 1 by mouth once daily 3)  Crestor 10 Mg Tabs (Rosuvastatin calcium) .... One by mouth once daily 4)  Vitamin D 1000 Unit Caps (Cholecalciferol) .... Two by mouth qd 5)  Bystolic 5 Mg Tabs (Nebivolol hcl) .... Take 1/2  tablet by mouth once a day 6)  Vitamin C  .... 1 by mouth qd 7)  Ondansetron Hcl 4 Mg Tabs (Ondansetron hcl) .... One by mouth two times a day as needed nausea 8)  Fluticasone Propionate 50 Mcg/act Susp (Fluticasone propionate) .... 2 sprays each nostril once daily 9)  B Complex-b12 Tabs (B complex vitamins) .... Take 1 tablet by mouth once  a day  Patient Instructions: 1)  Get plenty of rest 2)  Increase fluids 3)  Please schedule a follow-up appointment in 3 months. 4)  We will contact you re:  blood test results  Current Allergies (reviewed today): ! AMPICILLIN BENICAR (OLMESARTAN MEDOXOMIL) VICODIN (HYDROCODONE-ACETAMINOPHEN)       Appended Document: Orders Update    Clinical Lists Changes  Orders: Added new Service order of UA Dipstick w/o Micro (manual) ([id number]) - Signed Observations: Added new observation of APPEARANCE U: Clear (05/25/2009 8:39) Added new observation of UA COLOR: yellow (05/25/2009 8:39) Added new observation of PH URINE: 6.0  (05/25/2009 8:39) Added new observation of SPEC GR URIN: 1.020  (05/25/2009 8:39) Added new observation of WBC DIPSTK U: negative  (05/25/2009 8:39) Added new observation of NITRITE URN: negative  (05/25/2009 8:39) Added new observation of UROBILINOGEN: 0.2  (05/25/2009 8:39) Added new observation of PROTEIN, URN: negative  (05/25/2009 8:39) Added new observation of BLOOD UR DIP: negative  (05/25/2009 8:39) Added new observation of KETONES URN: negative  (05/25/2009 8:39) Added new observation of BILIRUBIN UR: negative  (05/25/2009 8:39) Added new observation of GLUCOSE, URN: negative  (05/25/2009 8:39)      Laboratory Results   Urine Tests    Routine Urinalysis   Color: yellow Appearance: Clear Glucose: negative   (Normal Range: Negative) Bilirubin: negative   (Normal Range: Negative) Ketone: negative   (Normal Range: Negative) Spec. Gravity: 1.020   (Normal Range: 1.003-1.035) Blood: negative   (Normal Range: Negative) pH: 6.0   (Normal Range: 5.0-8.0) Protein: negative   (Normal Range: Negative) Urobilinogen: 0.2   (Normal Range: 0-1) Nitrite: negative   (Normal Range: Negative) Leukocyte Esterace: negative   (Normal Range: Negative)

## 2010-04-20 NOTE — Progress Notes (Signed)
Summary: Lab Results   Phone Note Outgoing Call   Summary of Call: call pt - sed rate ( inflammatory marker ) -normal  Initial call taken by: D. Thomos Lemons DO,  October 05, 2009 6:11 PM  Follow-up for Phone Call        patient advised per Dr Artist Pais instructions Follow-up by: Glendell Docker CMA,  October 06, 2009 9:51 AM

## 2010-04-20 NOTE — Progress Notes (Signed)
Summary: Question about her medicine. Call Back pt.  Phone Note Call from Patient   Caller: Patient Call For: D. Thomos Lemons DO Summary of Call: Biostolic Medicine question that she needs help with. Please call her back at [phone] Initial call taken by: Michaelle Copas,  May 06, 2009 10:26 AM  Follow-up for Phone Call        attempted to contact patient at [phone] no answer , no voicemail Follow-up by: Glendell Docker CMA,  May 06, 2009 11:45 AM  Additional Follow-up for Phone Call Additional follow up Details #1::        attempted to contact patient at [phone] no answer, no voice mail Additional Follow-up by: Glendell Docker CMA,  May 07, 2009 3:21 PM    Additional Follow-up for Phone Call Additional follow up Details #2::    unable to reach patient and no return call from patient Follow-up by: Glendell Docker CMA,  May 08, 2009 1:14 PM

## 2010-04-20 NOTE — Letter (Signed)
   [place] [facility] [address] [place], [place]  [address]  [place] Phone: [phone]      May 26, 2009   [facility] [address] [place], [place] [address]  RE:  LAB RESULTS  Dear  Ms. Kerth,  The following is an interpretation of your most recent lab tests.  Please take note of any instructions provided or changes to medications that have resulted from your lab work.  ELECTROLYTES:  Good - no changes needed  KIDNEY FUNCTION TESTS:  Good - no changes needed  LIVER FUNCTION TESTS:  Good - no changes needed   THYROID STUDIES:  Thyroid studies normal TSH: 2.681     CBC:  Good - no changes needed       Sincerely Yours,    Dr. Thomos Lemons

## 2010-04-20 NOTE — Progress Notes (Signed)
Summary: Test Results  Phone Note Outgoing Call   Summary of Call: call pt - 2D echo of heart was normal.  I will discuss further at next OV Initial call taken by: D. Thomos Lemons DO,  Aug 10, 2009 6:22 PM  Follow-up for Phone Call        call placed to patient at [phone], no answer, no voice mail called patient no answer, will call patient back.  Follow-up by: Glendell Docker CMA,  Aug 11, 2009 8:42 AM  Additional Follow-up for Phone Call Additional follow up Details #1::        Called home number, unable to leave voice message. Mervin Kung CMA  Aug 12, 2009 9:22 AM     Additional Follow-up for Phone Call Additional follow up Details #2::    patient returned call, she was advised per Dr Artist Pais instructions Follow-up by: Glendell Docker CMA,  Aug 12, 2009 9:23 AM

## 2010-04-20 NOTE — Miscellaneous (Signed)
Summary: Flu Vaccine  Clinical Lists Changes  Observations: Added new observation of FLU VAX: Fluvax MCR (11/17/2009 10:03)      Immunization History:  Influenza Immunization History:    Influenza:  fluvax mcr (11/17/2009)

## 2010-04-20 NOTE — Assessment & Plan Note (Signed)
Summary: 3 month follow up/mhf   Vital Signs:  Patient profile:   77 year old female Height:      63 inches Weight:      169.25 pounds BMI:     30.09 O2 Sat:      99 % on Room air Temp:     98.0 degrees F oral Pulse rate:   60 / minute Pulse rhythm:   regular Resp:     18 per minute BP sitting:   120 / 70  (left arm) Cuff size:   regular  Vitals Entered By: Glendell Docker CMA (August 24, 2009 10:22 AM)  O2 Flow:  Room air CC: Rm 2- 3 Month Follow up   Primary Care Provider:  Dondra Spry DO  CC:  Rm 2- 3 Month Follow up.  History of Present Illness: 77 y/o white female for f/u dyspnea - improved. 2D echo showed  -  - Left ventricle: E/As normal. E/E' medial is 15.4. The cavity size       was normal. Wall thickness was normal. The estimated ejection       fraction was 65%. Wall motion was normal; there were no regional       wall motion abnormalities.     - Right ventricle: The cavity size was normal. Systolic function was       normal.     - Pulmonary arteries: PA peak pressure: 32mm Hg (S).     Transthoracic echocardiography. M-mode, complete 2D, spectral     Doppler, and color Doppler. Height: Height: 160cm. Height: 63in.     Weight: Weight: 75.8kg. Weight: 166.7lb. Body mass index: BMI:     29.6kg/m 2. Body surface area: BSA: 1.15m 2. Blood pressure: 123/73.     Patient status: Outpatient. Location: [place] [facility]        Hyperlipidemia - taking pravastatin.  attributes some chronic arthritic pain to statin  chronic fatigue - improved  Allergies: 1)  ! Ampicillin 2)  Benicar (Olmesartan Medoxomil) 3)  Vicodin (Hydrocodone-Acetaminophen)  Past History:  Past Medical History: Hyperlipidemia  Hypertension    Osteoarthritis          Vertigo  GYN Dr Payton Doughty Colon 2004 Dr Jarold Motto - nl     Past Surgical History: Hysterectomy, partial  COLONOSCOPY                 Family History: F MM Family History Hypertension  Family History Lung cancer  M           Social History: Single - widow  Never Smoked      Alcohol use-no      Occupation: working for senior care  and personal care asst       Physical Exam  General:  alert, well-developed, and well-nourished.   Neck:  supple, no masses, and no neck tenderness.   Lungs:  normal respiratory effort and normal breath sounds.   Heart:  normal rate, regular rhythm, and no gallop.   Extremities:  No lower extremity edema Neurologic:  cranial nerves II-XII intact and gait normal.     Impression & Recommendations:  Problem # 1:  DYSPNEA ON EXERTION (ICD-786.09) Assessment Improved 2D echo normal.  probable deconditioning.  I urged regular exercise program  Her updated medication list for this problem includes:    Bystolic 5 Mg Tabs (Nebivolol hcl) .Marland Kitchen... Take 1/2  tablet by mouth once a day  Problem # 2:  MALAISE AND FATIGUE (ICD-780.79) Assessment: Improved  unclear etiology.    Problem # 3:  HYPERLIPIDEMIA (ICD-272.4) I doubt hand arthritis related to statin.   I encouraged compliance.   Her updated medication list for this problem includes:    Pravastatin Sodium 20 Mg Tabs (Pravastatin sodium) ..... One by mouth qd  Labs Reviewed: SGOT: 16 (05/25/2009)   SGPT: 13 (05/25/2009)   HDL:66.80 (10/16/2008), 65.9 (01/01/2008)  LDL:79 (10/16/2008), 79 (01/01/2008)  Chol:185 (10/16/2008), 173 (01/01/2008)  Trig:196.0 (10/16/2008), 141 (01/01/2008)  Complete Medication List: 1)  Glucosamine 500 Mg Caps (Glucosamine sulfate) .Marland Kitchen.. 1 by mouth once daily 2)  Adult Aspirin Ec Low Strength 81 Mg Tbec (Aspirin) .Marland Kitchen.. 1 by mouth once daily 3)  Pravastatin Sodium 20 Mg Tabs (Pravastatin sodium) .... One by mouth qd 4)  Vitamin D 1000 Unit Caps (Cholecalciferol) .... Two by mouth qd 5)  Bystolic 5 Mg Tabs (Nebivolol hcl) .... Take 1/2  tablet by mouth once a day 6)  Vitamin C  .... 1 by mouth qd 7)  Citalopram Hydrobromide 10 Mg Tabs (Citalopram hydrobromide) .... 1/2 tab by mouth once daily x 7  days, then one by mouth qd  Patient Instructions: 1)  Please schedule a follow-up appointment in 6 months. 2)  Lipid Panel prior to visit, ICD-9:  272.4 3)  AST, ALT:  272.4 4)  Please return for lab work one (1) week before your next appointment.  Prescriptions: CITALOPRAM HYDROBROMIDE 10 MG TABS (CITALOPRAM HYDROBROMIDE) 1/2 tab by mouth once daily x 7 days, then one by mouth qd  #30 x 5   Entered and Authorized by:   D. Thomos Lemons DO   Signed by:   D. Thomos Lemons DO on 08/24/2009   Method used:   Electronically to        [facility] [phone]* (retail)       [address]       [place], [place]  [address]       Ph: [phone]       Fax: [phone]   RxID:   [id number] BYSTOLIC 5 MG TABS (NEBIVOLOL HCL) Take 1/2  tablet by mouth once a day  #15 x 5   Entered and Authorized by:   D. Thomos Lemons DO   Signed by:   D. Thomos Lemons DO on 08/24/2009   Method used:   Electronically to        [facility] [phone]* (retail)       [address]       [place], [place]  [address]       Ph: [phone]       Fax: [phone]   RxID:   [id number] PRAVASTATIN SODIUM 20 MG TABS (PRAVASTATIN SODIUM) one by mouth qd  #30 x 5   Entered and Authorized by:   D. Thomos Lemons DO   Signed by:   D. Thomos Lemons DO on 08/24/2009   Method used:   Electronically to        [facility] [phone]* (retail)       [address]       [place], [place]  [address]       Ph: [phone]       Fax: [phone]   RxID:   [id number]   Current Allergies (reviewed today): ! AMPICILLIN BENICAR (OLMESARTAN MEDOXOMIL) VICODIN (HYDROCODONE-ACETAMINOPHEN)   Preventive Care Screening  Pap Smear:    Date:  07/27/2009    Results:  normal   Mammogram:    Date:  07/23/2009    Results:  normal

## 2010-04-20 NOTE — Assessment & Plan Note (Signed)
Summary: 1 month follow up/mhf   Vital Signs:  Patient profile:   77 year old female Height:      63 inches Weight:      167 pounds BMI:     29.69 Temp:     97.8 degrees F oral Pulse rate:   56 / minute BP sitting:   140 / 68  (right arm) Cuff size:   regular  Vitals Entered By: Payton Spark CMA (July 16, 2009 11:04 AM)  CC: 1 mo f/u.   Primary Care Provider:  Dondra Spry DO  CC:  1 mo f/u.Marland Kitchen  History of Present Illness: 77 y/o white female for follow up dizziness somewhat better she restarted 1/2 dose of bystolic  she c/o persistent chronic fatigue she used to be "high energy" gets short of breath with exertion no chest pain  Current Medications (verified): 1)  Glucosamine 500 Mg  Caps (Glucosamine Sulfate) .Marland Kitchen.. 1 By Mouth Once Daily 2)  Adult Aspirin Ec Low Strength 81 Mg  Tbec (Aspirin) .Marland Kitchen.. 1 By Mouth Once Daily 3)  Crestor 10 Mg  Tabs (Rosuvastatin Calcium) .... 1/2 Tablets By Mouth Once Daily 4)  Vitamin D 1000 Unit  Caps (Cholecalciferol) .... Two By Mouth Qd 5)  Bystolic 5 Mg Tabs (Nebivolol Hcl) .... Take 1/2  Tablet By Mouth Once A Day (Hold) 6)  Vitamin C .... 1 By Mouth Qd 7)  Ondansetron Hcl 4 Mg Tabs (Ondansetron Hcl) .... One By Mouth Two Times A Day As Needed Nausea 8)  B Complex-B12  Tabs (B Complex Vitamins) .... Take 1 Tablet By Mouth Once A Day 9)  Diazepam 2 Mg Tabs (Diazepam) .... One By Mouth Two Times A Day As Needed For Dizziness  Allergies (verified): 1)  ! Ampicillin 2)  Benicar (Olmesartan Medoxomil) 3)  Vicodin (Hydrocodone-Acetaminophen)  Past History:  Past Medical History: Hyperlipidemia  Hypertension   Osteoarthritis         Vertigo  GYN Dr Payton Doughty Colon 2004 Dr Jarold Motto - nl     Past Surgical History: Hysterectomy, partial  COLONOSCOPY               Family History: F MM Family History Hypertension  Family History Lung cancer  M           Social History: Single - widow  Never Smoked     Alcohol use-no        Occupation: working for senior care  and personal care asst      Review of Systems       she works night shift 8pm to 8 am.  typically gets 5-6 hours of sleep per day. she took night job because she is afraid to stay by herself at night her sister diagnosed with lung cancer and has pacer  Physical Exam  General:  alert, well-developed, and well-nourished.   Lungs:  normal respiratory effort and normal breath sounds.   Heart:  normal rate, regular rhythm, and no gallop.   Extremities:  No lower extremity edema    Impression & Recommendations:  Problem # 1:  MALAISE AND FATIGUE (ICD-780.79)  I suspect chronic fatigue assoc with pt working night shift and poor sleep quality.  trial of zolpidem she also likely has adj d/o with anxiety.  use low dose citalopram  Orders: Prescription Created Electronically [id number])  Problem # 2:  DYSPNEA ON EXERTION (ICD-786.09) rule out cardiomyopathy.  arrange 2 D Echo  Her updated medication list for this problem includes:  Bystolic 5 Mg Tabs (Nebivolol hcl) .Marland Kitchen... Take 1/2  tablet by mouth once a day  Orders: Echo Referral (Echo) Prescription Created Electronically [id number])  Complete Medication List: 1)  Glucosamine 500 Mg Caps (Glucosamine sulfate) .Marland Kitchen.. 1 by mouth once daily 2)  Adult Aspirin Ec Low Strength 81 Mg Tbec (Aspirin) .Marland Kitchen.. 1 by mouth once daily 3)  Pravastatin Sodium 20 Mg Tabs (Pravastatin sodium) .... One by mouth qd 4)  Vitamin D 1000 Unit Caps (Cholecalciferol) .... Two by mouth qd 5)  Bystolic 5 Mg Tabs (Nebivolol hcl) .... Take 1/2  tablet by mouth once a day 6)  Vitamin C  .... 1 by mouth qd 7)  Ondansetron Hcl 4 Mg Tabs (Ondansetron hcl) .... One by mouth two times a day as needed nausea 8)  B Complex-b12 Tabs (B complex vitamins) .... Take 1 tablet by mouth once a day 9)  Diazepam 2 Mg Tabs (Diazepam) .... One by mouth two times a day as needed for dizziness 10)  Citalopram Hydrobromide 10 Mg Tabs (Citalopram  hydrobromide) .... 1/2 tab by mouth once daily x 7 days, then one by mouth qd 11)  Zolpidem Tartrate 5 Mg Tabs (Zolpidem tartrate) .... 1/2 to one tab by mouth at bedtime as needed  Patient Instructions: 1)  Please schedule a follow-up appointment in 1 month. 2)  Call our office if your symptoms do not  improve or gets worse. Prescriptions: ZOLPIDEM TARTRATE 5 MG TABS (ZOLPIDEM TARTRATE) 1/2 to one tab by mouth at bedtime as needed  #30 x 1   Entered and Authorized by:   D. Thomos Lemons DO   Signed by:   D. Thomos Lemons DO on 07/16/2009   Method used:   Print then Give to Patient   RxID:   [id number] CITALOPRAM HYDROBROMIDE 10 MG TABS (CITALOPRAM HYDROBROMIDE) 1/2 tab by mouth once daily x 7 days, then one by mouth qd  #30 x 3   Entered and Authorized by:   D. Thomos Lemons DO   Signed by:   D. Thomos Lemons DO on 07/16/2009   Method used:   Electronically to        [facility] [phone]* (retail)       [address]       [place], [place]  [address]       Ph: [phone]       Fax: [phone]   RxID:   [id number] PRAVASTATIN SODIUM 20 MG TABS (PRAVASTATIN SODIUM) one by mouth qd  #30 x 5   Entered and Authorized by:   D. Thomos Lemons DO   Signed by:   D. Thomos Lemons DO on 07/16/2009   Method used:   Electronically to        [facility] [phone]* (retail)       [name]       [place], [place]  [address]       Ph: [phone]       Fax: [phone]   RxID:   [id number]

## 2010-04-20 NOTE — Letter (Signed)
Summary: Letter with Mammogram Results/[facility]  Letter with Mammogram Results/[facility]   Imported By: Lanelle Bal 09/07/2009 11:04:53  _____________________________________________________________________  External Attachment:    Type:   Image     Comment:   External Document

## 2010-04-20 NOTE — Assessment & Plan Note (Signed)
Summary: dizzy/dt   Vital Signs:  Patient profile:   77 year old female Height:      63 inches Weight:      168 pounds BMI:     29.87 O2 Sat:      98 % on Room air Temp:     97.7 degrees F oral Pulse rate:   68 / minute Pulse rhythm:   regular Resp:     16 per minute BP sitting:   120 / 60  (left arm) Cuff size:   large  Vitals Entered By: Glendell Docker CMA (September 30, 2009 11:15 AM)  O2 Flow:  Room air CC: Rm 3- Visula disturbacne Is Patient Diabetic? No Pain Assessment Patient in pain? no        Primary Care Provider:  Dondra Spry DO  CC:  Rm 3- Visula disturbacne.  History of Present Illness: 77 y/o white female c/o visual disturbance her symptoms occurred on the way home from work no recent trauma or injury she describes flash of white light across her eyes   temporary vision loss , and she sat in the [place] parking lot for a few minutes,  about 2 minutes, and then she was fine.   mild left sided headache  Allergies: 1)  ! Ampicillin 2)  Benicar (Olmesartan Medoxomil) 3)  Vicodin (Hydrocodone-Acetaminophen)  Past History:  Past Medical History: Hyperlipidemia  Hypertension     Osteoarthritis          Vertigo  GYN Dr Payton Doughty Colon 2004 Dr Jarold Motto - nl     Past Surgical History: Hysterectomy, partial  COLONOSCOPY                  Family History: F MM Family History Hypertension  Family History Lung cancer  M             Social History: Single - widow  Never Smoked       Alcohol use-no      Occupation: working for senior care  and personal care asst       Review of Systems  The patient denies chest pain and syncope.         no preceding illness  Physical Exam  General:  alert, well-developed, and well-nourished.   Eyes:  pupils equal, pupils round, and pupils reactive to light.  limited left fundoscopic exam Lungs:  normal respiratory effort and normal breath sounds.   Heart:  normal rate, regular rhythm, and no gallop.     Neurologic:  cranial nerves II-XII intact and gait normal.   Psych:  normally interactive, good eye contact, not anxious appearing, and not depressed appearing.     Impression & Recommendations:  Problem # 1:  VISUAL CHANGES (ICD-368.10) 77 y/o experience sudden flash of light (mostly in left eye). her symptoms concerning for retinal detachment. arrange ophthalmology referral ASAP mild left sided headache.  doubt vasculitis but will check sed rate  Orders: Ophthalmology Referral (Ophthalmology) T-Sed Rate (Automated) [phone])  Complete Medication List: 1)  Glucosamine 500 Mg Caps (Glucosamine sulfate) .Marland Kitchen.. 1 by mouth once daily 2)  Adult Aspirin Ec Low Strength 81 Mg Tbec (Aspirin) .Marland Kitchen.. 1 by mouth once daily 3)  Pravastatin Sodium 20 Mg Tabs (Pravastatin sodium) .... One by mouth qd 4)  Vitamin D 1000 Unit Caps (Cholecalciferol) .... Two by mouth qd 5)  Bystolic 5 Mg Tabs (Nebivolol hcl) .... Take 1/2  tablet by mouth once a day 6)  Vitamin C  .Marland Kitchen.. 1  by mouth qd 7)  Citalopram Hydrobromide 10 Mg Tabs (Citalopram hydrobromide) .... 1/2 tab by mouth once daily x 7 days, then one by mouth qd 8)  Estropipate 0.75 Mg Tabs (Estropipate) .... Take 1 tablet by mouth once a day (patient to verfiy dosage)  Patient Instructions: 1)  Follow up with Dr. Elmer Picker within 24-48 hrs. 2)  Call our office if your symptoms do not  improve or gets worse.  Current Allergies (reviewed today): ! AMPICILLIN BENICAR (OLMESARTAN MEDOXOMIL) VICODIN (HYDROCODONE-ACETAMINOPHEN)

## 2010-04-22 NOTE — Assessment & Plan Note (Signed)
Summary: shoulder pain over the weekend and now feels weak/mhf--Rm 3   Vital Signs:  Patient profile:   77 year old female Height:      63 inches Weight:      174.50 pounds BMI:     31.02 O2 Sat:      98 % on Room air Temp:     97.8 degrees F oral Pulse rate:   70 / minute Pulse rhythm:   regular Resp:     18 per minute BP sitting:   124 / 80  (right arm) Cuff size:   large  Vitals Entered By: Mervin Kung CMA Duncan Dull) (February 22, 2010 1:21 PM)  O2 Flow:  Room air CC: Pt states she had weakness and left arm tightness on Friday.  Is Patient Diabetic? No Pain Assessment Patient in pain? no        Primary Care Provider:  Dondra Spry DO  CC:  Pt states she had weakness and left arm tightness on Friday. 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  she  braced her fall and lifted pt she got  sweaty during episode  later that day she felt pain in  left bicep and tingling in her left hand  no recurrence of sweating or chest pain  Preventive Screening-Counseling & Management  Alcohol-Tobacco     Smoking Status: never  Allergies: 1)  ! Ampicillin 2)  Benicar (Olmesartan Medoxomil) 3)  Vicodin (Hydrocodone-Acetaminophen)  Past History:  Past Medical History: Hyperlipidemia  Hypertension     Osteoarthritis          Vertigo    GYN Dr Payton Doughty Colon 2004 Dr Jarold Motto - nl     Past Surgical History: Hysterectomy, partial  COLONOSCOPY                    Family History: F MM Family History Hypertension  Family History Lung cancer  M              Social History: Single - widow  Never Smoked       Alcohol use-no      Occupation: working for senior care  and personal care asst        Review of Systems  The patient denies syncope and abdominal pain.    Physical Exam  General:  alert, well-developed, and well-nourished.   Head:  normocephalic and  atraumatic.   Neck:  supple, no masses, and no neck tenderness.   Lungs:  normal respiratory effort, normal breath sounds, no crackles, and no wheezes.   Heart:  normal rate, regular rhythm, no murmur, and no gallop.   Extremities:  No lower extremity edema Neurologic:  cranial nerves II-XII intact and gait normal.   Psych:  normally interactive, good eye contact, not anxious appearing, and not depressed appearing.     Impression & Recommendations:  Problem # 1:  CHEST PAIN, ATYPICAL (ICD-786.59) I doubt left are pain in anginal equivalent.  she has some pain in left bicep today with arm flexion EKG is reassuring. observe for now pt will call if recurrence of symptoms  Problem # 2:  HYPERTENSION (ICD-401.9) Assessment: Unchanged  Her updated medication list for this problem includes:    Bystolic 5 Mg Tabs (Nebivolol hcl) .Marland Kitchen... Take 1/2  tablet by mouth once a day  BP today: 124/80 Prior BP: 120/60 (09/30/2009)  Labs Reviewed: K+: 4.2 (05/25/2009) Creat: : 1.04 (05/25/2009)   Chol: 185 (10/16/2008)   HDL: 66.80 (10/16/2008)   LDL: 79 (10/16/2008)   TG: 196.0 (10/16/2008)  Problem # 3:  HYPERLIPIDEMIA (ICD-272.4) Assessment: Unchanged  Her updated medication list for this problem includes:    Pravastatin Sodium 20 Mg Tabs (Pravastatin sodium) ..... One by mouth qd  Labs Reviewed: SGOT: 16 (05/25/2009)   SGPT: 13 (05/25/2009)   HDL:66.80 (10/16/2008), 65.9 (01/01/2008)  LDL:79 (10/16/2008), 79 (01/01/2008)  Chol:185 (10/16/2008), 173 (01/01/2008)  Trig:196.0 (10/16/2008), 141 (01/01/2008)  Complete Medication List: 1)  Glucosamine 500 Mg Caps (Glucosamine sulfate) .Marland Kitchen.. 1 by mouth once daily 2)  Adult Aspirin Ec Low Strength 81 Mg Tbec (Aspirin) .Marland Kitchen.. 1 by mouth once daily 3)  Pravastatin Sodium 20 Mg Tabs (Pravastatin sodium) .... One by mouth qd 4)  Vitamin D 1000 Unit Caps (Cholecalciferol) .... Two by mouth qd 5)  Bystolic 5 Mg Tabs (Nebivolol hcl) .... Take 1/2  tablet by  mouth once a day 6)  Vitamin C  .... 1 by mouth qd 7)  Citalopram Hydrobromide 10 Mg Tabs (Citalopram hydrobromide) .... Take one by tablet mouth once daily 8)  Estropipate 0.75 Mg Tabs (Estropipate) .... Take 1 tablet by mouth once a day (patient to verfiy dosage) 9)  Vitamin B12  .... Take 1 tablet by mouth once a day.  Patient Instructions: 1)  Call our office if your symptoms do not  improve or gets worse. 2)  Please schedule a follow-up appointment in 3 months. 3)  BMP prior to visit, ICD-9: 401.9 4)  Hepatic Panel prior to visit, ICD-9: 272.4 5)  Lipid Panel prior to visit, ICD-9: 272.4 6)  TSH prior to visit, ICD-9: 272.4 7)  Please return for lab work one (1) week before your next appointment.  Prescriptions: CITALOPRAM HYDROBROMIDE 10 MG TABS (CITALOPRAM HYDROBROMIDE) Take one by tablet mouth once daily  #90 x 1   Entered and Authorized by:   D. Thomos Lemons DO   Signed by:   D. Thomos Lemons DO on 02/22/2010   Method used:   Electronically to        [facility] [phone]* (retail)       [address]       [place], [place]  [address]       Ph: [phone]       Fax: [phone]   RxID:   [id number] PRAVASTATIN SODIUM 20 MG TABS (PRAVASTATIN SODIUM) one by mouth qd  #90 x 1   Entered and Authorized by:   D. Thomos Lemons DO   Signed by:   D. Thomos Lemons DO on 02/22/2010   Method used:   Electronically to        [facility] [phone]* (retail)       [address]       [place], [place]  [address]       Ph: [phone]       Fax: [phone]   RxID:   [id number] BYSTOLIC 5 MG TABS (NEBIVOLOL HCL) Take 1/2  tablet by mouth once a day  #30 x 5   Entered and Authorized by:   D. Thomos Lemons DO   Signed by:   D. Thomos Lemons DO on 02/22/2010   Method used:   Electronically to        [facility] [phone]* (retail)       [address]  [place], [place]  [address]       Ph: [phone]       Fax: [phone]   RxID:   [id number]    Orders  Added: 1)  Est. Patient Level IV [84132]    Current Allergies (reviewed today): ! AMPICILLIN BENICAR (OLMESARTAN MEDOXOMIL) VICODIN (HYDROCODONE-ACETAMINOPHEN)

## 2010-04-22 NOTE — Progress Notes (Signed)
Summary: got a voice mail asking her to make an appt   Phone Note Call from Patient Call back at Home Phone [phone]   Caller: Patient Call For: Carolyn Adams  Summary of Call: patient says she had a voice mail asking her to call in and make an appt per Dr Artist Pais.  Please advise what type of appt he wants her to make.   Initial call taken by: Roselle Locus,  March 30, 2010 10:39 AM  Follow-up for Phone Call        call returned to patient at [phone], no answer. no voice mail Follow-up by: Glendell Docker CMA,  March 30, 2010 12:02 PM  Additional Follow-up for Phone Call Additional follow up Details #1::        no return call from patient regarding message status. origin of phone call/message  unknown Additional Follow-up by: Glendell Docker CMA,  March 31, 2010 10:31 AM

## 2010-04-22 NOTE — Assessment & Plan Note (Signed)
Summary: PER PHONE NOTE/M[facility]   Vital Signs:  Patient profile:   77 year old female Height:      63 inches Weight:      177.75 pounds BMI:     31.60 O2 Sat:      97 % on Room air Temp:     97.4 degrees F oral Pulse rate:   55 / minute Resp:     18 per minute BP sitting:   120 / 60  (left arm) Cuff size:   large  Vitals Entered By: Glendell Docker CMA (April 01, 2010 9:26 AM)  O2 Flow:  Room air CC: follow-up visit Is Patient Diabetic? No Pain Assessment Patient in pain? no      Comments c/o right side discomfort, not sure if coul be related to muscle strain   Primary Care Provider:  D. Thomos Lemons DO  CC:  follow-up visit.  History of Present Illness: right sided flank discomfort not sharp, worse with side bending / movement  pulling sensation  no urinary symptoms  htn - stable  Preventive Screening-Counseling & Management  Alcohol-Tobacco     Smoking Status: never  Allergies: 1)  ! Ampicillin 2)  Benicar (Olmesartan Medoxomil) 3)  Vicodin (Hydrocodone-Acetaminophen)  Past History:  Past Medical History: Hyperlipidemia  Hypertension      Osteoarthritis          Vertigo    GYN Dr Payton Doughty Colon 2004 Dr Jarold Motto - nl     Family History: F MM Family History Hypertension  Family History Lung cancer  M            sister recently died from unknown cancer (she lived in [place], [place]) brother lives in [place] - CAD s/p 3 stents  Physical Exam  General:  alert, well-developed, and well-nourished.   Lungs:  normal respiratory effort and normal breath sounds.   Heart:  normal rate, regular rhythm, and no gallop.   Msk:  mild right sided right lower rib tenderness,  no rash   Impression & Recommendations:  Problem # 1:  LUMBAR STRAIN (ICD-847.2) Assessment New use tylenol as needed we reviewed stretching exercises Patient advised to call office if symptoms persist or worsen.  Problem # 2:  HYPERTENSION (ICD-401.9) Assessment: Unchanged  Her  updated medication list for this problem includes:    Bystolic 5 Mg Tabs (Nebivolol hcl) .Marland Kitchen... Take 1/2  tablet by mouth once a day  Orders: T-Basic Metabolic Panel [id number])  BP today: 120/60 Prior BP: 124/80 (02/22/2010)  Labs Reviewed: K+: 4.2 (05/25/2009) Creat: : 1.04 (05/25/2009)   Chol: 185 (10/16/2008)   HDL: 66.80 (10/16/2008)   LDL: 79 (10/16/2008)   TG: 196.0 (10/16/2008)  Complete Medication List: 1)  Glucosamine 500 Mg Caps (Glucosamine sulfate) .Marland Kitchen.. 1 by mouth once daily 2)  Adult Aspirin Ec Low Strength 81 Mg Tbec (Aspirin) .Marland Kitchen.. 1 by mouth once daily 3)  Livalo 2 Mg Tabs (Pitavastatin calcium) .... One by mouth once daily 4)  Vitamin D 1000 Unit Caps (Cholecalciferol) .... Two by mouth qd 5)  Bystolic 5 Mg Tabs (Nebivolol hcl) .... Take 1/2  tablet by mouth once a day 6)  Vitamin C 500 Mg Tabs (Ascorbic acid) .... Take 1 tablet by mouth once a day 7)  Citalopram Hydrobromide 10 Mg Tabs (Citalopram hydrobromide) .... Take one by tablet mouth once daily 8)  Estropipate 0.75 Mg Tabs (Estropipate) .... Take 1 tablet by mouth once a day (patient to verfiy dosage) 9)  Vitamin B-12  100 Mcg Tabs (Cyanocobalamin) .... Take 1 tablet by mouth once a day  Other Orders: T-Hepatic Function [id number]) T-Lipid Profile [id number]) T-TSH ([phone])  Patient Instructions: 1)  Please schedule a follow-up appointment in 4 months.   Orders Added: 1)  T-Basic Metabolic Panel [[id number]] 2)  T-Hepatic Function [[id number]] 3)  T-Lipid Profile [[id number]] 4)  T-TSH [[id number]] 5)  Est. Patient Level III [28413]    Current Allergies (reviewed today): ! AMPICILLIN BENICAR (OLMESARTAN MEDOXOMIL) VICODIN (HYDROCODONE-ACETAMINOPHEN)

## 2010-04-22 NOTE — Progress Notes (Signed)
Summary: Choelesterol medication status  ---- Converted from flag ---- ---- 04/02/2010 4:33 PM, D. Thomos Lemons DO wrote: plz call pt - has she been taking her cholesterol medication on a regular basis ------------------------------  Phone Note Outgoing Call   Call placed by: Glendell Docker CMA,  April 05, 2010 11:31 AM Call placed to: Patient Summary of Call: call placed to patient at [phone], she states that she has been taking her medication on a regular basis, and has not missed any doses Initial call taken by: Glendell Docker CMA,  April 05, 2010 11:32 AM  Follow-up for Phone Call        Please call pt on cell   [phone] Follow-up by: Darral Dash,  April 05, 2010 11:44 AM  Additional Follow-up for Phone Call Additional follow up Details #1::        call pt - I suggest we switch cholesterol medication.  pravastatin no strong enough see new rx.  pt can pick up samples of livalo 2 mg  Additional Follow-up by: D. Thomos Lemons DO,  April 06, 2010 3:26 PM    Additional Follow-up for Phone Call Additional follow up Details #2::    call returned to patient at [phone], she has been advised per Dr Artist Pais instructions. Samples left at front desk for patient pick up  Follow-up by: Glendell Docker CMA,  April 06, 2010 3:32 PM  New/Updated Medications: LIVALO 2 MG TABS (PITAVASTATIN CALCIUM) one by mouth once daily Prescriptions: LIVALO 2 MG TABS (PITAVASTATIN CALCIUM) one by mouth once daily  #30 x 2   Entered and Authorized by:   D. Thomos Lemons DO   Signed by:   D. Thomos Lemons DO on 04/06/2010   Method used:   Electronically to        [facility] [phone]* (retail)       [address]       [place], [place]  [address]       Ph: [phone]       Fax: [phone]   RxID:   [id number]

## 2010-04-23 NOTE — Miscellaneous (Signed)
Summary: PT Discharge/[facility]  PT Discharge/[facility]   Imported By: Lanelle Bal 08/04/2009 14:23:39  _____________________________________________________________________  External Attachment:    Type:   Image     Comment:   External Document

## 2010-07-23 ENCOUNTER — Ambulatory Visit (INDEPENDENT_AMBULATORY_CARE_PROVIDER_SITE_OTHER): Payer: Medicare Other | Admitting: Family

## 2010-07-23 ENCOUNTER — Other Ambulatory Visit: Payer: Self-pay | Admitting: Family

## 2010-07-23 ENCOUNTER — Encounter: Payer: Self-pay | Admitting: Family

## 2010-07-23 ENCOUNTER — Ambulatory Visit (HOSPITAL_BASED_OUTPATIENT_CLINIC_OR_DEPARTMENT_OTHER)
Admission: RE | Admit: 2010-07-23 | Discharge: 2010-07-23 | Disposition: A | Discharge status: Home / Self Care | Payer: Medicare Other | Source: Ambulatory Visit | Attending: Family | Admitting: Family

## 2010-07-23 ENCOUNTER — Telehealth: Payer: Self-pay | Admitting: Family

## 2010-07-23 VITALS — BP 130/60 | HR 66 | Temp 98.3°F | Resp 16 | Ht 62.99 in | Wt 180.1 lb

## 2010-07-23 DIAGNOSIS — I8390 Asymptomatic varicose veins of unspecified lower extremity: Secondary | ICD-10-CM | POA: Insufficient documentation

## 2010-07-23 DIAGNOSIS — M7989 Other specified soft tissue disorders: Secondary | ICD-10-CM

## 2010-07-23 DIAGNOSIS — M79609 Pain in unspecified limb: Secondary | ICD-10-CM | POA: Insufficient documentation

## 2010-07-23 DIAGNOSIS — R609 Edema, unspecified: Secondary | ICD-10-CM

## 2010-07-23 DIAGNOSIS — M25469 Effusion, unspecified knee: Secondary | ICD-10-CM

## 2010-07-23 DIAGNOSIS — M25461 Effusion, right knee: Secondary | ICD-10-CM | POA: Insufficient documentation

## 2010-07-23 DIAGNOSIS — R52 Pain, unspecified: Secondary | ICD-10-CM

## 2010-07-23 MED ORDER — MELOXICAM 7.5 MG PO TABS: 7.5000 mg | ORAL_TABLET | Freq: Every day | ORAL | Status: DC

## 2010-07-23 NOTE — Assessment & Plan Note (Signed)
Given pain and family history, a lower extremity Doppler was performed to evaluate for Baker's cyst and to exclude DVT. At this time preliminary report is negative for both DVT and Baker's cyst. Will plan to treat with a short course of Mobic, obtain plain film of right knee, and we'll refer her to Dr. Norton Blizzard for further evaluation of right knee effusion.

## 2010-07-23 NOTE — Patient Instructions (Signed)
Please complete your ultrasound on the first floor and then return upstairs so that we can review your plan of care.

## 2010-07-23 NOTE — Progress Notes (Signed)
  Subjective:    Patient ID: Carolyn Adams, female    DOB: 15-Nov-1933, 77 y.o.   MRN: [id number]  HPI  [name] is a 77 yr old female who presents today with chief complaint of right knee and leg pain. She notes that this started yesterday.  She describes her pain as moderate.  Symptoms are not improving or worsening.  She tried tylenol without relieft.  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 She is concerned about the possibility of a blood clot (sister had DVT).  Notes mild associated RLE swelling, shortness of breath, fever, erythema or chest pain.  Denies personal hx of DVT.   Review of Systems    see HPI  No past medical history on file.  History   Social History  . Marital Status: Widowed    Spouse Name: N/A    Number of Children: N/A  . Years of Education: N/A   Occupational History  . Not on file.   Social History Main Topics  . Smoking status: Never Smoker   . Smokeless tobacco: Never Used  . Alcohol Use: No  . Drug Use: Not on file  . Sexually Active: Not on file   Other Topics Concern  . Not on file   Social History Narrative  . No narrative on file    No past surgical history on file.  No family history on file.  Allergies  Allergen Reactions  . Ampicillin     REACTION: Chest Pain  . Hydrocodone-Acetaminophen     REACTION: VOMITING  . Olmesartan Medoxomil     REACTION: stomach upset    No current outpatient prescriptions on file prior to visit.    BP 130/60  Pulse 66  Temp(Src) 98.3 F (36.8 C) (Oral)  Resp 16  Ht 5' 2.99" (1.6 m)  Wt 180 lb 1.3 oz (81.684 kg)  BMI 31.91 kg/m2    Objective:   Physical Exam  General: 77 year old female seated in chair awake and alert, in no acute distress Cardiovascular: S1-S2 regular rate and rhythm, no murmur.  Extremities: Trace right lower extremity edema is noted Musculoskeletal: Positive swelling and  boggy effusion noted beneath the right patella. No warmth or erythema is noted       Assessment & Plan:

## 2010-07-23 NOTE — Telephone Encounter (Signed)
Called pt to review preliminary results.  Reviewed preliminary results with patient and plans for referral to Dr.Hudnall, mobic, and her need to return for knee x-ray.  Pt verbalizes understanding.

## 2010-07-26 ENCOUNTER — Ambulatory Visit: Payer: Medicare Other | Admitting: Family Medicine

## 2010-07-27 ENCOUNTER — Encounter: Payer: Self-pay | Admitting: Family Medicine

## 2010-07-27 ENCOUNTER — Ambulatory Visit (INDEPENDENT_AMBULATORY_CARE_PROVIDER_SITE_OTHER): Payer: Medicare Other | Admitting: Family Medicine

## 2010-07-27 VITALS — BP 129/76 | HR 62 | Temp 97.9°F | Ht 63.0 in | Wt 179.6 lb

## 2010-07-27 DIAGNOSIS — M25561 Pain in right knee: Secondary | ICD-10-CM | POA: Insufficient documentation

## 2010-07-27 DIAGNOSIS — M25569 Pain in unspecified knee: Secondary | ICD-10-CM

## 2010-07-27 NOTE — Progress Notes (Signed)
Subjective:    Patient ID: Carolyn Adams, female    DOB: [birth year], 77 y.o.   MRN: [id number]  HPI  77 yo F here for right lower extremity pain and swelling.  Patient reports that about 5-6 days ago she woke up with pain and swelling in right leg from about knee down. 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 No injury or pain that day, pain started the following day. She had a doppler u/s on 5/4 which was negative for a DVT or bakers cyst. Referred here for evaluation of right knee pain and effusion. Since that OV on 5/4 has been taking meloxicam daily - denies having any pain and states pain has gone down significantly as of 2 days ago. No prior right knee injuries, surgeries, or pain. No catching, locking, giving out.  Past Medical History  Diagnosis Date  . Hyperlipidemia   . Hypertension   . Osteoarthritis   . Vertigo     Current Outpatient Prescriptions on File Prior to Visit  Medication Sig Dispense Refill  . Ascorbic Acid (VITAMIN C) 500 MG tablet Take 500 mg by mouth daily.        Marland Kitchen aspirin 81 MG EC tablet Take 81 mg by mouth daily.        . Cholecalciferol (VITAMIN D) 1000 UNITS capsule Take 2 capsules by mouth daily.       . citalopram (CELEXA) 10 MG tablet Take 10 mg by mouth daily.        Marland Kitchen estropipate (OGEN) 0.75 MG tablet Take 0.75 mg by mouth daily.        . Glucosamine 500 MG TABS Take 1 tablet by mouth daily.        . meloxicam (MOBIC) 7.5 MG tablet Take 1 tablet (7.5 mg total) by mouth daily.  15 tablet  0  . nebivolol (BYSTOLIC) 5 MG tablet Take 1/2 tablet daily.       . pravastatin (PRAVACHOL) 20 MG tablet Take 20 mg by mouth daily.        . vitamin B-12 (CYANOCOBALAMIN) 100 MCG tablet Take 50 mcg by mouth daily.          Past Surgical History  Procedure Date  . Abdominal hysterectomy     partial    Allergies  Allergen Reactions  . Ampicillin    REACTION: Chest Pain  . Hydrocodone-Acetaminophen     REACTION: VOMITING  . Olmesartan Medoxomil     REACTION: stomach upset    History   Social History  . Marital Status: Widowed    Spouse Name: N/A    Number of Children: N/A  . Years of Education: N/A   Occupational History  . 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 Smoking status: Never Smoker   . Smokeless tobacco: Never Used  . Alcohol Use: No  . Drug Use: Not on file  . Sexually Active: Not on file   Other Topics Concern  . Not on file   Social History Narrative  . No narrative on file    Family History  Problem Relation Age of Onset  . Cancer Sister   . Coronary artery disease Brother     s/p 3 stents  . Hypertension Other   . Cancer Other     lung  . Diabetes Neg Hx   . Heart attack  Son     BP 129/76  Pulse 62  Temp(Src) 97.9 F (36.6 C) (Oral)  Ht 5\' 3"  (1.6 m)  Wt 179 lb 9.6 oz (81.466 kg)  BMI 31.81 kg/m2  Review of Systems See HPI above.    Objective:   Physical Exam Gen: NAD R knee: No gross deformity, warmth, effusion, erythema. Mild TTP medial joint line.  No lat joint line, post patella, pes anserine TTP. FROM. Stable to valgus and varus stress.  Negative ant/post drawers.  Negative lachmanns Negative mcmurrays, apleys. Negative patellar grind, apprehension. NVI distally.    Assessment & Plan:  1. Right knee pain/effusion - patient's symptoms and effusion have resolved.  Only finding on exam is mild tenderness medially - otherwise normal.  Likely has medial DJD that flared up with repetitive activity walking up and down stairs day prior to these issues.  Without any current issues, will not pursue workup at this time.  If she does have recurrence of pain or swelling, advised her we would be happy to see her.  Would order Standing AP/lateral, and sunrise views of right knee to assess degree of DJD.  No mechanical symptoms to suggest meniscal tear (and  negative testing).  F/u prn.

## 2010-07-27 NOTE — Assessment & Plan Note (Signed)
patient's symptoms and effusion have resolved.  Only finding on exam is mild tenderness medially - otherwise normal.  Likely has medial DJD that flared up with repetitive activity walking up and down stairs day prior to these issues.  Without any current issues, will not pursue workup at this time.  If she does have recurrence of pain or swelling, advised her we would be happy to see her.  Would order Standing AP/lateral, and sunrise views of right knee to assess degree of DJD.  No mechanical symptoms to suggest meniscal tear (and negative testing).  F/u prn.

## 2010-08-06 NOTE — Assessment & Plan Note (Signed)
[facility]                         GASTROENTEROLOGY OFFICE NOTE   NAME:MITCHELLAudri, Adams                     MRN:          [id number]  DATE:06/13/2006                            DOB:          [birth year]    Carolyn Adams is a 77 year old white female employee at [employer] who  was referred through the courtesy of Dr. Posey Rea for evaluation of one  month of mostly right upper quadrant pain, but also pain in her left CVA  area.   [name] relates that for the last month she has had  musculoskeletal type pain in her back alleviated by Advil.  It  definitely has musculoskeletal components and is worse with bending and  lifting.  She has had no other GI symptomatology such as nausea or  vomiting, reflux symptoms, dysphagia or any hepatobiliary complaints.  UA and laboratory data has been unremarkable, as was CT scan of the  abdomen on June 17, 2006.  I previously did a screening colonoscopy on  this patient in September of 2004.  She follows a regular diet and  denies specific food intolerances.   PAST MEDICAL HISTORY:  Fairly unremarkable, except for some hypertension  and hypercholesterolemia.  She has had a previous hysterectomy and  appendectomy.   MEDICATIONS:  1. Ogen 0.625 mg a day.  2. Aspirin 81 mg a day.  3. A variety of multiple vitamins.  4. Glucosamine.   ALLERGIES:  She denies drug allergies.   FAMILY HISTORY:  Noncontributory.   SOCIAL HISTORY:  She is widowed and lives alone.  She has a high school  education.  She does not smoke or use ethanol.   REVIEW OF SYSTEMS:  Noncontributory without any genitourinary,  cardiovascular, pulmonary, neurologic, orthopedic or endocrine problems.   PHYSICAL EXAMINATION:  GENERAL:  A healthy-appearing white female who  appears her stated age, in no acute distress.  I cannot appreciate  stigmata of chronic liver disease.  VITAL SIGNS:  She is 5 feet, 3 inches tall and weighs 168  pounds.  Blood  pressure 140/76, pulse 60 and regular.  There was no thyromegaly noted.  CHEST:  Entirely clear to percussion and auscultation.  She appeared to  be in a regular rhythm without murmurs, gallops, or rubs.  ABDOMEN:  There was no hepatosplenomegaly, abdominal masses or  tenderness.  Bowel sounds were normal.  There was no CVA tenderness.  I  could not appreciate any rib lesions on either side of back area.  EXTREMITIES:  Peripheral extremities were unremarkable.  MENTAL STATUS:  Normal.  RECTAL:  Inspection of the rectum was unremarkable, as was rectal exam  with soft profuse stool which was guaiac negative.   ASSESSMENT:  I think Mrs. Knisely definitely has musculoskeletal pain,  and I doubt that there is any GI source to account for her difficulties.  She certainly has no evidence of organomegaly on physical examination.   RECOMMENDATIONS:  1. Advil 2 every 8 hours as needed for her back pain.  2. Coverage with Protonix 40 mg a day, since she will be on the NSAID      and  aspirin therapy.  3. Continue medical follow up with Dr. Posey Rea.     Vania Rea. Jarold Motto, MD, Caleen Essex, FAGA  Electronically Signed    DRP/MedQ  DD: 06/13/2006  DT: 06/13/2006  Job #: [id number]   cc:   Georgina Quint. Plotnikov, MD

## 2010-08-19 ENCOUNTER — Encounter: Payer: Self-pay | Admitting: Family

## 2010-08-20 ENCOUNTER — Ambulatory Visit: Payer: Medicare Other | Admitting: Family

## 2010-08-20 ENCOUNTER — Ambulatory Visit (INDEPENDENT_AMBULATORY_CARE_PROVIDER_SITE_OTHER): Payer: Medicare Other | Admitting: Family

## 2010-08-20 ENCOUNTER — Encounter: Payer: Self-pay | Admitting: Family

## 2010-08-20 DIAGNOSIS — Z0289 Encounter for other administrative examinations: Secondary | ICD-10-CM

## 2010-08-20 DIAGNOSIS — M25561 Pain in right knee: Secondary | ICD-10-CM

## 2010-08-20 DIAGNOSIS — J069 Acute upper respiratory infection, unspecified: Secondary | ICD-10-CM | POA: Insufficient documentation

## 2010-08-20 DIAGNOSIS — M25569 Pain in unspecified knee: Secondary | ICD-10-CM

## 2010-08-20 MED ORDER — MELOXICAM 7.5 MG PO TABS: 7.5000 mg | ORAL_TABLET | Freq: Every day | ORAL | Status: DC

## 2010-08-20 NOTE — Progress Notes (Unsigned)
  Subjective:    Patient ID: Carolyn Adams, female    DOB: 09-Jan-1934, 77 y.o.   MRN: [id number]  HPI    Review of Systems     Objective:   Physical Exam        Assessment & Plan:

## 2010-08-20 NOTE — Patient Instructions (Signed)
Please schedule a medicare wellness visit at the front desk.

## 2010-08-20 NOTE — Assessment & Plan Note (Signed)
This is improved, continue meloxicam when necessary.

## 2010-08-20 NOTE — Assessment & Plan Note (Signed)
I suspect that her fatigue is related to recent resolving URI. She is instructed to contact us if her symptoms worsen or if they do not continue to improve.

## 2010-08-20 NOTE — Progress Notes (Signed)
Subjective:    Patient ID: Carolyn Adams, female    DOB: Jan 31, 1934, 77 y.o.   MRN: [id number]  HPI [name] is a 77 year old female who presents today following a recent viral illness.  1) Viral illness- reports that she was ill about 2.5 weeks ago.  Had fever 101 initially.  Overall she is feeling better, she reports near resolution of her cough, but continues to have some hoarseness and fatigue. She reports that she never did seek medical attention during this illness however she rested at home. She denies any urinary frequency or burning.  2) R knee effusion- improved, occasional mild pain.  Not taking anything at this time. She saw Dr. Pearletha Forge for this and her knee effusion had resolved by the time she appeared for her appointment. Review of Systems See history of present illness  Past Medical History  Diagnosis Date  . Hyperlipidemia   . Hypertension   . Osteoarthritis   . Vertigo     History   Social History  . Marital Status: Widowed    Spouse Name: N/A    Number of Children: N/A  . Years of Education: N/A   Occupational History  . 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 Smoking status: Never Smoker   . Smokeless tobacco: Never Used  . Alcohol Use: No  . Drug Use: Not on file  . Sexually Active: Not on file   Other Topics Concern  . Not on file   Social History Narrative  . No narrative on file    Past Surgical History  Procedure Date  . Abdominal hysterectomy     partial    Family History  Problem Relation Age of Onset  . Cancer Sister   . Coronary artery disease Brother     s/p 3 stents  . Heart disease Brother     s/p 3 stents  . Hypertension Other   . Cancer Other     lung  . Diabetes Neg Hx   . Heart attack Son     Allergies  Allergen Reactions  . Ampicillin     REACTION: Chest Pain  . Hydrocodone-Acetaminophen     REACTION: VOMITING  . Olmesartan Medoxomil     REACTION: stomach upset     Current Outpatient Prescriptions on File Prior to Visit  Medication Sig Dispense Refill  . Ascorbic Acid (VITAMIN C) 500 MG tablet Take 500 mg by mouth daily.        Marland Kitchen aspirin 81 MG EC tablet Take 81 mg by mouth daily.        . Cholecalciferol (VITAMIN D) 1000 UNITS capsule Take 2 capsules by mouth daily.       . citalopram (CELEXA) 10 MG tablet Take 10 mg by mouth daily.        Marland Kitchen estropipate (OGEN) 0.75 MG tablet Take 0.75 mg by mouth daily.        . Glucosamine 500 MG TABS Take 1 tablet by mouth daily.        . nebivolol (BYSTOLIC) 5 MG tablet Take 1/2 tablet daily.       . pravastatin (PRAVACHOL) 20 MG tablet Take 20 mg by mouth daily.        . vitamin B-12 (CYANOCOBALAMIN) 100 MCG tablet Take 50 mcg by mouth daily.        Marland Kitchen DISCONTD: meloxicam (MOBIC) 7.5 MG tablet Take 1 tablet (7.5 mg total) by mouth daily.  15 tablet  0    BP 126/66  Pulse 66  Temp(Src) 97.9 F (36.6 C) (Oral)  Resp 16  Ht 5\' 2"  (1.575 m)  Wt 180 lb 1.3 oz (81.684 kg)  BMI 32.94 kg/m2  SpO2 96%       Objective:   Physical Exam  General: Overweight white female, awake, alert pleasant and in no acute distress. ENT: Bilateral tympanic membranes are intact without bulging or erythema. No pharyngeal erythema is noted. Neck: Supple without JVD or cervical adenopathy Cardiovascular: S1, S2, regular rate and rhythm Respiratory: Breath sounds are clear to auscultation bilaterally without wheezes, rales, or rhonchi. Cardiovascular: S1, S2 regular rate and rhythm is noted Musculoskeletal: Resolution of right knee effusion is noted        Assessment & Plan:

## 2010-09-03 ENCOUNTER — Other Ambulatory Visit: Payer: Self-pay | Admitting: Internal Medicine

## 2010-10-13 ENCOUNTER — Ambulatory Visit (INDEPENDENT_AMBULATORY_CARE_PROVIDER_SITE_OTHER): Payer: Medicare Other | Admitting: Internal Medicine

## 2010-10-13 ENCOUNTER — Encounter: Payer: Self-pay | Admitting: Internal Medicine

## 2010-10-13 VITALS — BP 130/60 | HR 50 | Temp 98.1°F | Resp 18 | Ht 63.0 in | Wt 179.0 lb

## 2010-10-13 DIAGNOSIS — J40 Bronchitis, not specified as acute or chronic: Secondary | ICD-10-CM

## 2010-10-13 MED ORDER — DOXYCYCLINE HYCLATE 100 MG PO TABS: 100.0000 mg | ORAL_TABLET | Freq: Two times a day (BID) | ORAL | Status: AC

## 2010-10-13 NOTE — Assessment & Plan Note (Signed)
Begin antibiotic therapy to completion. Followup if no improvement or worsening.

## 2010-10-13 NOTE — Progress Notes (Signed)
  Subjective:    Patient ID: Carolyn Adams, female    DOB: 1934/03/07, 77 y.o.   MRN: [id number]  HPI Patient presents to clinic for evaluation of cough. Notes 2 week history of productive cough with associated nasal drainage. Denies headaches congestion fever chills shortness of breath hemoptysis or wheezing. Chart review indicates her upper respiratory infection in May which patient states resolved entirely. No exacerbating or alleviating factors. No other complaints.  Reviewed past medical history, medications and allergies    Review of Systems see history of present illness     Objective:   Physical Exam    Physical Exam  Vitals reviewed. Constitutional:  appears well-developed and well-nourished. No distress.  HENT:  Head: Normocephalic and atraumatic.  Right Ear: Tympanic membrane, external ear and ear canal normal.  Left Ear: Tympanic membrane, external ear and ear canal normal.  Nose: Nose normal.  Mouth/Throat: Oropharynx is clear and moist. No oropharyngeal exudate.  Eyes: Conjunctivae and EOM are normal. Pupils are equal, round, and reactive to light. Right eye exhibits no discharge. Left eye exhibits no discharge. No scleral icterus.  Neck: Neck supple. No thyromegaly present.  Cardiovascular: Normal rate, regular rhythm and normal heart sounds.  Exam reveals no gallop and no friction rub.   No murmur heard. Pulmonary/Chest: Effort normal and breath sounds normal. No respiratory distress.  has no wheezes.  has no rales.  Lymphadenopathy:   no cervical adenopathy.  Neurological:  is alert.  Skin: Skin is warm and dry.  not diaphoretic.  Psychiatric: normal mood and affect.      Assessment & Plan:

## 2010-11-12 ENCOUNTER — Ambulatory Visit: Payer: Medicare Other | Admitting: Internal Medicine

## 2010-11-12 ENCOUNTER — Encounter: Payer: Self-pay | Admitting: Internal Medicine

## 2010-11-12 ENCOUNTER — Ambulatory Visit (INDEPENDENT_AMBULATORY_CARE_PROVIDER_SITE_OTHER): Payer: Medicare Other | Admitting: Internal Medicine

## 2010-11-12 DIAGNOSIS — E785 Hyperlipidemia, unspecified: Secondary | ICD-10-CM

## 2010-11-12 DIAGNOSIS — Z79899 Other long term (current) drug therapy: Secondary | ICD-10-CM

## 2010-11-12 DIAGNOSIS — E559 Vitamin D deficiency, unspecified: Secondary | ICD-10-CM

## 2010-11-12 DIAGNOSIS — K219 Gastro-esophageal reflux disease without esophagitis: Secondary | ICD-10-CM

## 2010-11-12 DIAGNOSIS — I10 Essential (primary) hypertension: Secondary | ICD-10-CM

## 2010-11-12 LAB — HEPATIC FUNCTION PANEL
ALT: 16 U/L (ref 0–35)
AST: 23 U/L (ref 0–37)
Bilirubin, Direct: 0.1 mg/dL (ref 0.0–0.3)
Indirect Bilirubin: 0.3 mg/dL (ref 0.0–0.9)
Total Protein: 7.1 g/dL (ref 6.0–8.3)

## 2010-11-12 LAB — CBC
MCH: 30.2 pg (ref 26.0–34.0)
MCHC: 33 g/dL (ref 30.0–36.0)
RDW: 13.5 % (ref 11.5–15.5)

## 2010-11-12 LAB — BASIC METABOLIC PANEL
Calcium: 10 mg/dL (ref 8.4–10.5)
Potassium: 4.5 mEq/L (ref 3.5–5.3)
Sodium: 141 mEq/L (ref 135–145)

## 2010-11-12 LAB — LIPID PANEL
Cholesterol: 237 mg/dL — ABNORMAL HIGH (ref 0–200)
Total CHOL/HDL Ratio: 4.2 Ratio
Triglycerides: 254 mg/dL — ABNORMAL HIGH (ref <150)
VLDL: 51 mg/dL — ABNORMAL HIGH (ref 0–40)

## 2010-11-12 MED ORDER — OMEPRAZOLE 40 MG PO CPDR: 40.0000 mg | DELAYED_RELEASE_CAPSULE | Freq: Every day | ORAL | Status: DC

## 2010-11-12 NOTE — Patient Instructions (Signed)
Please schedule lipid/lft 272.4 prior to next visit  

## 2010-11-12 NOTE — Assessment & Plan Note (Signed)
Begin omeprazole 40mg  qd. Followup if no improvement or worsening.

## 2010-11-12 NOTE — Assessment & Plan Note (Signed)
Normotensive and stable. Continue current regimen. Monitor bp as outpt and followup in clinic as scheduled. Obtain cbc and chem7 

## 2010-11-12 NOTE — Assessment & Plan Note (Signed)
Obtain lipid/lft. 

## 2010-11-12 NOTE — Progress Notes (Signed)
  Subjective:    Patient ID: Carolyn Adams, female    DOB: Feb 10, 1934, 77 y.o.   MRN: [id number]  HPI Pt presents to clinic for followup of multiple medical problems. Notes two month h/o anterior throat burning with h/o GERD sx's as well. No dysphagia or odynophagia. No neck pain or tenderness. No exacerbating or alleviating factors. Tolerates statin tx without myalgias or abn lfts. BP reviewed nl. No other complaints.  Past Medical History  Diagnosis Date  . Hyperlipidemia   . Hypertension   . Osteoarthritis   . Vertigo    Past Surgical History  Procedure Date  . Abdominal hysterectomy     partial    reports that she has never smoked. She has never used smokeless tobacco. She reports that she does not drink alcohol. Her drug history not on file. family history includes Cancer in her other and sister; Coronary artery disease in her brother; Heart attack in her son; Heart disease in her brother; and Hypertension in her other.  There is no history of Diabetes. Allergies  Allergen Reactions  . Ampicillin     REACTION: Chest Pain  . Hydrocodone-Acetaminophen     REACTION: VOMITING  . Olmesartan Medoxomil     REACTION: stomach upset     Review of Systems see hpi     Objective:   Physical Exam  Physical Exam  Nursing note and vitals reviewed. Constitutional: Appears well-developed and well-nourished. No distress.  HENT: perrla, eom grossly intact, op clear Head: Normocephalic and atraumatic.  Right Ear: External ear normal. Nl tm and canal Left Ear: External ear normal. nl tm and canal Eyes: Conjunctivae are normal. No scleral icterus.  Neck: Neck supple. Carotid bruit is not present.  Cardiovascular: Normal rate, regular rhythm and normal heart sounds.  Exam reveals no gallop and no friction rub.   No murmur heard. Pulmonary/Chest: Effort normal and breath sounds normal. No respiratory distress. He has no wheezes. no rales.  Abd: soft, nd, nt, +bs. No masses or  organomegally. Lymphadenopathy:    He has no cervical adenopathy.  Neurological:Alert.  Skin: Skin is warm and dry. Not diaphoretic.  Psychiatric: Has a normal mood and affect.   Ext: +2 dp pulses      Assessment & Plan:

## 2010-11-13 LAB — VITAMIN D 25 HYDROXY (VIT D DEFICIENCY, FRACTURES): Vit D, 25-Hydroxy: 32 ng/mL (ref 30–89)

## 2010-11-24 ENCOUNTER — Other Ambulatory Visit: Payer: Self-pay | Admitting: Internal Medicine

## 2010-11-24 DIAGNOSIS — E785 Hyperlipidemia, unspecified: Secondary | ICD-10-CM

## 2010-12-01 ENCOUNTER — Encounter: Payer: Self-pay | Admitting: Internal Medicine

## 2010-12-01 ENCOUNTER — Ambulatory Visit (INDEPENDENT_AMBULATORY_CARE_PROVIDER_SITE_OTHER): Payer: Medicare Other | Admitting: Internal Medicine

## 2010-12-01 DIAGNOSIS — I10 Essential (primary) hypertension: Secondary | ICD-10-CM

## 2010-12-01 DIAGNOSIS — R5381 Other malaise: Secondary | ICD-10-CM

## 2010-12-01 DIAGNOSIS — R5383 Other fatigue: Secondary | ICD-10-CM

## 2010-12-01 DIAGNOSIS — E785 Hyperlipidemia, unspecified: Secondary | ICD-10-CM

## 2010-12-01 MED ORDER — CLONAZEPAM 0.5 MG PO TABS: 0.5000 mg | ORAL_TABLET | Freq: Every evening | ORAL | Status: AC | PRN

## 2010-12-01 MED ORDER — PRAVASTATIN SODIUM 40 MG PO TABS: 40.0000 mg | ORAL_TABLET | Freq: Every day | ORAL | Status: DC

## 2010-12-01 MED ORDER — PRAVASTATIN SODIUM 20 MG PO TABS: 40.0000 mg | ORAL_TABLET | Freq: Every day | ORAL | Status: DC

## 2010-12-01 NOTE — Assessment & Plan Note (Signed)
Goal LDL < 130.  Increase pravastatin to 40 mg Lab Results  Component Value Date   CHOL 237* 11/12/2010   CHOL 265* 04/01/2010   CHOL 185 10/16/2008   Lab Results  Component Value Date   HDL 56 11/12/2010   HDL 63 1/61/0960   HDL 66.80 10/16/2008   Lab Results  Component Value Date   LDLCALC 130* 11/12/2010   LDLCALC 156* 04/01/2010   LDLCALC 79 10/16/2008   Lab Results  Component Value Date   TRIG 254* 11/12/2010   TRIG 228* 04/01/2010   TRIG 196.0* 10/16/2008   Lab Results  Component Value Date   CHOLHDL 4.2 11/12/2010   CHOLHDL 4.2 Ratio 04/01/2010   CHOLHDL 3 10/16/2008   Lab Results  Component Value Date   LDLDIRECT 175.9 10/09/2007   Lab Results  Component Value Date   ALT 16 11/12/2010   AST 23 11/12/2010   ALKPHOS 84 11/12/2010   BILITOT 0.4 11/12/2010

## 2010-12-01 NOTE — Patient Instructions (Signed)
Monitor your BP at home.  Bring BP log to your next f/u appt.

## 2010-12-01 NOTE — Progress Notes (Signed)
Subjective:    Patient ID: Carolyn Adams, female    DOB: 12-23-1933, 77 y.o.   MRN: [id number]  HPI  77 y/o white female with hx fo hypertension and hyperlipidemia for routine follow up. She does not monitor BP at home.  She denies HA or chest pain.  She complains of chronic fatigue.  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  When she gets home from work, she sleeps from 8am to 1 pm.  Occasional naps  Hyperlipidemia - good med compliance.  Denies myalgias  Review of Systems See hpi  Past Medical History  Diagnosis Date  . Hyperlipidemia   . Hypertension   . Osteoarthritis   . Vertigo     History   Social History  . Marital Status: Widowed    Spouse Name: N/A    Number of Children: N/A  . Years of Education: N/A   Occupational History  . 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 Smoking status: Never Smoker   . Smokeless tobacco: Never Used  . Alcohol Use: No  . Drug Use: Not on file  . Sexually Active: Not on file   Other Topics Concern  . Not on file   Social History Narrative  . No narrative on file    Past Surgical History  Procedure Date  . Abdominal hysterectomy     partial    Family History  Problem Relation Age of Onset  . Cancer Sister   . Coronary artery disease Brother     s/p 3 stents  . Heart disease Brother     s/p 3 stents  . Hypertension Other   . Cancer Other     lung  . Diabetes Neg Hx   . Heart attack Son     Allergies  Allergen Reactions  . Ampicillin     REACTION: Chest Pain  . Hydrocodone-Acetaminophen     REACTION: VOMITING  . Olmesartan Medoxomil     REACTION: stomach upset    Current Outpatient Prescriptions on File Prior to Visit  Medication Sig Dispense Refill  . Ascorbic Acid (VITAMIN C) 500 MG tablet Take 500 mg by mouth daily.        Marland Kitchen aspirin 81 MG EC tablet Take 81 mg by mouth daily.        . Cholecalciferol (VITAMIN D) 1000 UNITS capsule Take 2 capsules by  mouth daily.       . citalopram (CELEXA) 10 MG tablet Take 10 mg by mouth daily.        Marland Kitchen estropipate (OGEN) 0.75 MG tablet Take 0.75 mg by mouth daily.        . Glucosamine 500 MG TABS Take 1 tablet by mouth daily.        . meloxicam (MOBIC) 7.5 MG tablet Take 1 tablet (7.5 mg total) by mouth daily.  30 tablet  0  . nebivolol (BYSTOLIC) 5 MG tablet Take 1/2 tablet daily.       Marland Kitchen omeprazole (PRILOSEC) 40 MG capsule Take 1 capsule (40 mg total) by mouth daily.  30 capsule  4  . vitamin B-12 (CYANOCOBALAMIN) 100 MCG tablet Take 50 mcg by mouth daily.          BP 154/80  Pulse 68  Temp(Src) 98.6 F (37 C) (Oral)  Ht 5\' 3"  (1.6 m)  Wt 186 lb (84.369 kg)  BMI 32.95 kg/m2       Objective:  Physical Exam   Constitutional: Appears well-developed and well-nourished. No distress.   Neck: Normal range of motion. Neck supple. No thyromegaly present. No carotid bruit Cardiovascular: Normal rate, regular rhythm and normal heart sounds.  Exam reveals no gallop and no friction rub.   No murmur heard. Pulmonary/Chest: Effort normal and breath sounds normal.  No wheezes. No rales.  Abdominal: Soft. Bowel sounds are normal. No mass. There is no tenderness.  Neurological: Alert. No cranial nerve deficit.  Skin: Skin is warm and dry.  Psychiatric: Normal mood and affect. Behavior is normal.       Assessment & Plan:

## 2010-12-01 NOTE — Assessment & Plan Note (Signed)
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  Poor sleep. Use clonazepam .5 mg at bedtime as needed.  She tried [place] in the past but it caused next day "fog".

## 2010-12-01 NOTE — Assessment & Plan Note (Signed)
Office reading elevated today.  Monitor BP at home. Will adjust meds if necessary after reviewing home BP log. Home BP monitoring protocol reviewed. BP: 154/80 mmHg

## 2010-12-29 ENCOUNTER — Emergency Department (HOSPITAL_COMMUNITY)
Admission: EM | Admit: 2010-12-29 | Discharge: 2010-12-29 | Disposition: A | Discharge status: Home / Self Care | Payer: Medicare Other | Attending: Emergency Medicine | Admitting: Emergency Medicine

## 2010-12-29 ENCOUNTER — Emergency Department (HOSPITAL_COMMUNITY): Payer: Medicare Other

## 2010-12-29 DIAGNOSIS — W108XXA Fall (on) (from) other stairs and steps, initial encounter: Secondary | ICD-10-CM | POA: Insufficient documentation

## 2010-12-29 DIAGNOSIS — Z79899 Other long term (current) drug therapy: Secondary | ICD-10-CM | POA: Insufficient documentation

## 2010-12-29 DIAGNOSIS — I10 Essential (primary) hypertension: Secondary | ICD-10-CM | POA: Insufficient documentation

## 2010-12-29 DIAGNOSIS — S0990XA Unspecified injury of head, initial encounter: Secondary | ICD-10-CM | POA: Insufficient documentation

## 2010-12-29 DIAGNOSIS — E785 Hyperlipidemia, unspecified: Secondary | ICD-10-CM | POA: Insufficient documentation

## 2010-12-29 DIAGNOSIS — S0180XA Unspecified open wound of other part of head, initial encounter: Secondary | ICD-10-CM | POA: Insufficient documentation

## 2010-12-29 LAB — GLUCOSE, CAPILLARY: Glucose-Capillary: 85 mg/dL (ref 70–99)

## 2011-01-05 ENCOUNTER — Encounter: Payer: Self-pay | Admitting: Family Medicine

## 2011-01-05 ENCOUNTER — Ambulatory Visit (INDEPENDENT_AMBULATORY_CARE_PROVIDER_SITE_OTHER): Payer: Medicare Other | Admitting: Family Medicine

## 2011-01-05 DIAGNOSIS — S0181XA Laceration without foreign body of other part of head, initial encounter: Secondary | ICD-10-CM

## 2011-01-05 DIAGNOSIS — I10 Essential (primary) hypertension: Secondary | ICD-10-CM

## 2011-01-05 DIAGNOSIS — S0083XA Contusion of other part of head, initial encounter: Secondary | ICD-10-CM

## 2011-01-05 DIAGNOSIS — S1093XA Contusion of unspecified part of neck, initial encounter: Secondary | ICD-10-CM

## 2011-01-05 DIAGNOSIS — S0180XA Unspecified open wound of other part of head, initial encounter: Secondary | ICD-10-CM

## 2011-01-05 NOTE — Patient Instructions (Signed)
Be sure to keep follow up appointment at end of this month.

## 2011-01-05 NOTE — Progress Notes (Signed)
  Subjective:    Patient ID: Carolyn Adams, female    DOB: [birth year], 77 y.o.   MRN: [id number]  HPI Followup from fall one week ago today. She missed a step and fell landing on her hands and striking left orbital region against the cement. No loss of consciousness. No history of syncope. No headaches or dizziness. Patient went to emergency room.  ER notes and x-rays reviewed.  CT head no acute traumatic injury to brain. Left periorbital films revealed no fracture. Patient doing well this time. Some facial soreness otherwise no acute findings.  Hypertension treated with Bystolic 5 mg daily.  Does not monitor blood pressures at home. Slightly elevated last visit. Previous good control prior to that. No chest pains, dizziness, dyspnea, or peripheral edema.  Compliant with therapy and no side effects.  Past Medical History  Diagnosis Date  . Hyperlipidemia   . Hypertension   . Osteoarthritis   . Vertigo    Past Surgical History  Procedure Date  . Abdominal hysterectomy     partial    reports that she has never smoked. She has never used smokeless tobacco. She reports that she does not drink alcohol. Her drug history not on file. family history includes Cancer in her other and sister; Coronary artery disease in her brother; Heart attack in her son; Heart disease in her brother; and Hypertension in her other.  There is no history of Diabetes. Allergies  Allergen Reactions  . Ampicillin     REACTION: Chest Pain  . Hydrocodone-Acetaminophen     REACTION: VOMITING  . Olmesartan Medoxomil     REACTION: stomach upset      Review of Systems  Constitutional: Negative for fever and chills.  HENT: Negative for neck pain.   Respiratory: Negative for cough and shortness of breath.   Cardiovascular: Negative for chest pain.  Gastrointestinal: Negative for abdominal pain.  Genitourinary: Negative for hematuria.  Musculoskeletal: Negative for back pain.  Neurological: Negative for  dizziness, syncope, weakness and headaches.  Psychiatric/Behavioral: Negative for confusion.       Objective:   Physical Exam  Constitutional: She is oriented to person, place, and time. She appears well-developed and well-nourished.  HENT:       Patient has a healing laceration left brow region.  Laceration repaired with glue.   Fading ecchymosis inferior and lateral left orbit  Neck: Normal range of motion. Neck supple.  Cardiovascular: Normal rate and regular rhythm.   Pulmonary/Chest: Effort normal and breath sounds normal. No respiratory distress. She has no wheezes. She has no rales.  Musculoskeletal: She exhibits no edema.  Neurological: She is alert and oriented to person, place, and time.  Psychiatric: She has a normal mood and affect.          Assessment & Plan:  #1 contusions and laceration left face.  Healing well.  Released back to work starting tomorrow #2 hypertension. Poorly controlled by today's visit. She has followup in 2 weeks to reassess. If still up then consider additional medication.

## 2011-01-09 ENCOUNTER — Other Ambulatory Visit: Payer: Self-pay | Admitting: Internal Medicine

## 2011-01-19 ENCOUNTER — Ambulatory Visit (INDEPENDENT_AMBULATORY_CARE_PROVIDER_SITE_OTHER): Payer: Medicare Other | Admitting: Internal Medicine

## 2011-01-19 ENCOUNTER — Encounter: Payer: Self-pay | Admitting: Internal Medicine

## 2011-01-19 DIAGNOSIS — I10 Essential (primary) hypertension: Secondary | ICD-10-CM

## 2011-01-19 MED ORDER — NEBIVOLOL HCL 5 MG PO TABS: 5.0000 mg | ORAL_TABLET | Freq: Every day | ORAL | Status: DC

## 2011-01-19 NOTE — Assessment & Plan Note (Signed)
The patient has been monitoring her blood pressure at home and has noticed persistent elevation of systolic blood pressure. Increase Bystolic to 5 mg.  We discussed adding low-dose ARB if blood pressure still uncontrolled.

## 2011-01-19 NOTE — Patient Instructions (Signed)
Please monitor your blood pressure at home. Bring your BP log to your next office visit

## 2011-01-19 NOTE — Progress Notes (Signed)
Subjective:    Patient ID: Carolyn Adams, female    DOB: 07/30/1933, 77 y.o.   MRN: [id number]  HPI  77 year old white female for followup regarding hypertension. Patient was previously seen after suffering significant fall with laceration to left side of face. Fortunately she has healed well from her injuries. She has not had any subsequent falls. She denies syncope or presyncope.    She is currently taking bystolic 5 mg one half tab daily.  As directed by Dr. Caryl Never, she has been monitoring her blood pressure at home. Her systolic blood pressure readings have been elevated in the 140s to 150s.  Review of Systems Negative for chest pain no shortness of breath  Past Medical History  Diagnosis Date  . Hyperlipidemia   . Hypertension   . Osteoarthritis   . Vertigo     History   Social History  . Marital Status: Widowed    Spouse Name: N/A    Number of Children: N/A  . Years of Education: N/A   Occupational History  . 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 Smoking status: Never Smoker   . Smokeless tobacco: Never Used  . Alcohol Use: No  . Drug Use: Not on file  . Sexually Active: Not on file   Other Topics Concern  . Not on file   Social History Narrative  . No narrative on file    Past Surgical History  Procedure Date  . Abdominal hysterectomy     partial    Family History  Problem Relation Age of Onset  . Cancer Sister   . Coronary artery disease Brother     s/p 3 stents  . Heart disease Brother     s/p 3 stents  . Hypertension Other   . Cancer Other     lung  . Diabetes Neg Hx   . Heart attack Son     Allergies  Allergen Reactions  . Ampicillin     REACTION: Chest Pain  . Hydrocodone-Acetaminophen     REACTION: VOMITING  . Olmesartan Medoxomil     REACTION: stomach upset    Current Outpatient Prescriptions on File Prior to Visit  Medication Sig Dispense Refill  . Ascorbic Acid (VITAMIN C) 500 MG tablet  Take 500 mg by mouth daily.        Marland Kitchen aspirin 81 MG EC tablet Take 81 mg by mouth daily.        . Cholecalciferol (VITAMIN D) 1000 UNITS capsule Take 2 capsules by mouth daily.       . citalopram (CELEXA) 10 MG tablet take 1 tablet by mouth once daily  30 tablet  3  . estropipate (OGEN) 0.75 MG tablet 1/2 tab daily      . pravastatin (PRAVACHOL) 40 MG tablet Take 1 tablet (40 mg total) by mouth daily.  90 tablet  1  . vitamin B-12 (CYANOCOBALAMIN) 100 MCG tablet Take 50 mcg by mouth daily.          BP 142/80  Pulse 60  Temp(Src) 98.1 F (36.7 C) (Oral)  Ht 5\' 3"  (1.6 m)  Wt 187 lb (84.823 kg)  BMI 33.13 kg/m2       Objective:   Physical Exam   Constitutional: Appears well-developed and well-nourished. No distress.  Neck: Normal range of motion. Neck supple. No thyromegaly present. No carotid bruit Face:  Mild edema - left side of face Cardiovascular: Normal rate, regular rhythm and normal heart  sounds.  Exam reveals no gallop and no friction rub.  No murmur heard. Pulmonary/Chest: Effort normal and breath sounds normal.  No wheezes. No rales.  Neurological: Alert. No cranial nerve deficit.  Skin: Skin is warm and dry.  Psychiatric: Normal mood and affect. Behavior is normal.      Assessment & Plan:

## 2011-03-26 ENCOUNTER — Other Ambulatory Visit: Payer: Self-pay | Admitting: Internal Medicine

## 2011-04-04 ENCOUNTER — Encounter: Payer: Self-pay | Admitting: Internal Medicine

## 2011-04-04 ENCOUNTER — Ambulatory Visit (INDEPENDENT_AMBULATORY_CARE_PROVIDER_SITE_OTHER): Payer: Medicare Other | Admitting: Internal Medicine

## 2011-04-04 DIAGNOSIS — H9209 Otalgia, unspecified ear: Secondary | ICD-10-CM | POA: Insufficient documentation

## 2011-04-04 MED ORDER — TRIAMCINOLONE ACETONIDE 0.1 % EX CREA: TOPICAL_CREAM | Freq: Two times a day (BID) | CUTANEOUS | Status: DC

## 2011-04-04 MED ORDER — CEPHALEXIN 500 MG PO CAPS: 500.0000 mg | ORAL_CAPSULE | Freq: Three times a day (TID) | ORAL | Status: AC

## 2011-04-04 NOTE — Patient Instructions (Signed)
Please call our office if your symptoms do not improve or gets worse.  

## 2011-04-04 NOTE — Assessment & Plan Note (Signed)
78 year old white female with eczema of left ear with possible secondary cellulitis.  Treat with keflex and triamcinolone cream.  Patient advised to call office if symptoms persist or worsen.

## 2011-04-04 NOTE — Progress Notes (Signed)
Subjective:    Patient ID: Carolyn Adams, female    DOB: 27-Jun-1933, 78 y.o.   MRN: [id number]  Otalgia  There is pain in the left ear. This is a new problem. The current episode started in the past 7 days. The problem occurs hourly. The problem has been unchanged. There has been no fever. The pain is mild. Pertinent negatives include no ear discharge or headaches.   She reports mild tenderness behind left ear.  She denies history of ezcema.   Review of Systems  HENT: Positive for ear pain. Negative for ear discharge.   Neurological: Negative for headaches.   Past Medical History  Diagnosis Date  . Hyperlipidemia   . Hypertension   . Osteoarthritis   . Vertigo     History   Social History  . Marital Status: Widowed    Spouse Name: N/A    Number of Children: N/A  . Years of Education: N/A   Occupational History  . 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 Smoking status: Never Smoker   . Smokeless tobacco: Never Used  . Alcohol Use: No  . Drug Use: Not on file  . Sexually Active: Not on file   Other Topics Concern  . Not on file   Social History Narrative  . No narrative on file    Past Surgical History  Procedure Date  . Abdominal hysterectomy     partial    Family History  Problem Relation Age of Onset  . Cancer Sister   . Coronary artery disease Brother     s/p 3 stents  . Heart disease Brother     s/p 3 stents  . Hypertension Other   . Cancer Other     lung  . Diabetes Neg Hx   . Heart attack Son     Allergies  Allergen Reactions  . Ampicillin     REACTION: Chest Pain  . Hydrocodone-Acetaminophen     REACTION: VOMITING  . Olmesartan Medoxomil     REACTION: stomach upset    Current Outpatient Prescriptions on File Prior to Visit  Medication Sig Dispense Refill  . Ascorbic Acid (VITAMIN C) 500 MG tablet Take 500 mg by mouth daily.        Marland Kitchen aspirin 81 MG EC tablet Take 81 mg by mouth daily.        Marland Kitchen  BYSTOLIC 5 MG tablet take 1/2 tablet by mouth once daily  30 tablet  5  . Cholecalciferol (VITAMIN D) 1000 UNITS capsule Take 2 capsules by mouth daily.       . citalopram (CELEXA) 10 MG tablet take 1 tablet by mouth once daily  30 tablet  3  . estropipate (OGEN) 0.75 MG tablet 1/2 tab daily      . pravastatin (PRAVACHOL) 40 MG tablet Take 1 tablet (40 mg total) by mouth daily.  90 tablet  1  . vitamin B-12 (CYANOCOBALAMIN) 100 MCG tablet Take 50 mcg by mouth daily.          BP 154/84  Temp(Src) 97.9 F (36.6 C) (Oral)  Wt 187 lb (84.823 kg)       Objective:   Physical Exam  Constitutional: She appears well-developed and well-nourished.  HENT:  Head: Normocephalic and atraumatic.       Redness and scaliness of left outer ear,  Mild mastoid tenderness  Cardiovascular: Normal rate, regular rhythm and normal heart sounds.   Pulmonary/Chest: Effort normal and  breath sounds normal. She has no wheezes. She has no rales.       Assessment & Plan:

## 2011-04-13 ENCOUNTER — Other Ambulatory Visit: Payer: Self-pay | Admitting: *Deleted

## 2011-04-13 ENCOUNTER — Ambulatory Visit: Payer: Medicare Other | Admitting: Internal Medicine

## 2011-04-13 MED ORDER — PRAVASTATIN SODIUM 40 MG PO TABS: 40.0000 mg | ORAL_TABLET | Freq: Every day | ORAL | Status: DC

## 2011-04-17 IMAGING — CR DG CHEST 2V
2 series · 2 of 2 positions shown · non-contrast
Comparison: 08/01/2006.

CLINICAL DATA: 74-year-old female with cough and shortness of
breath.

CHEST - 2 VIEW

[w chest pa]
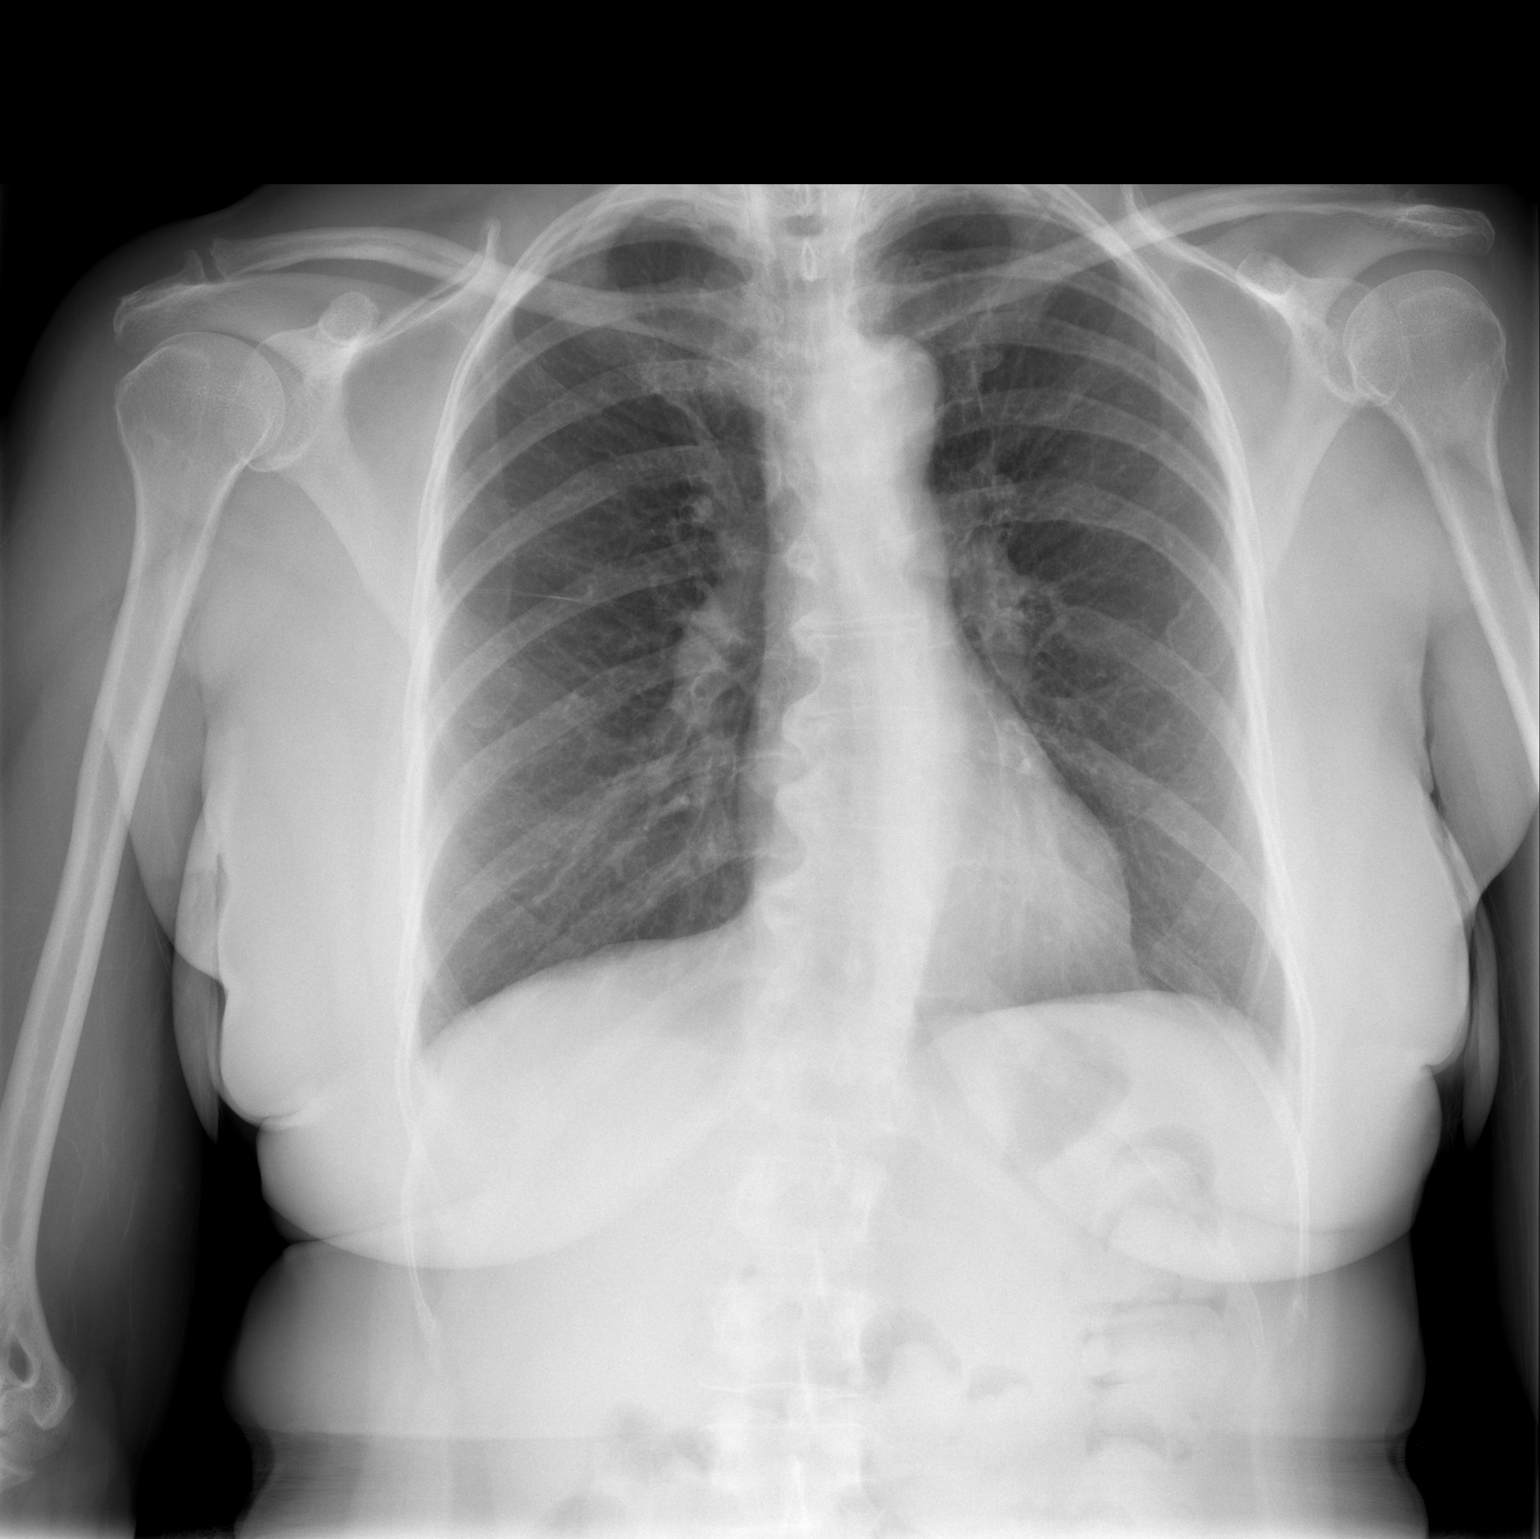

[w chest lat]
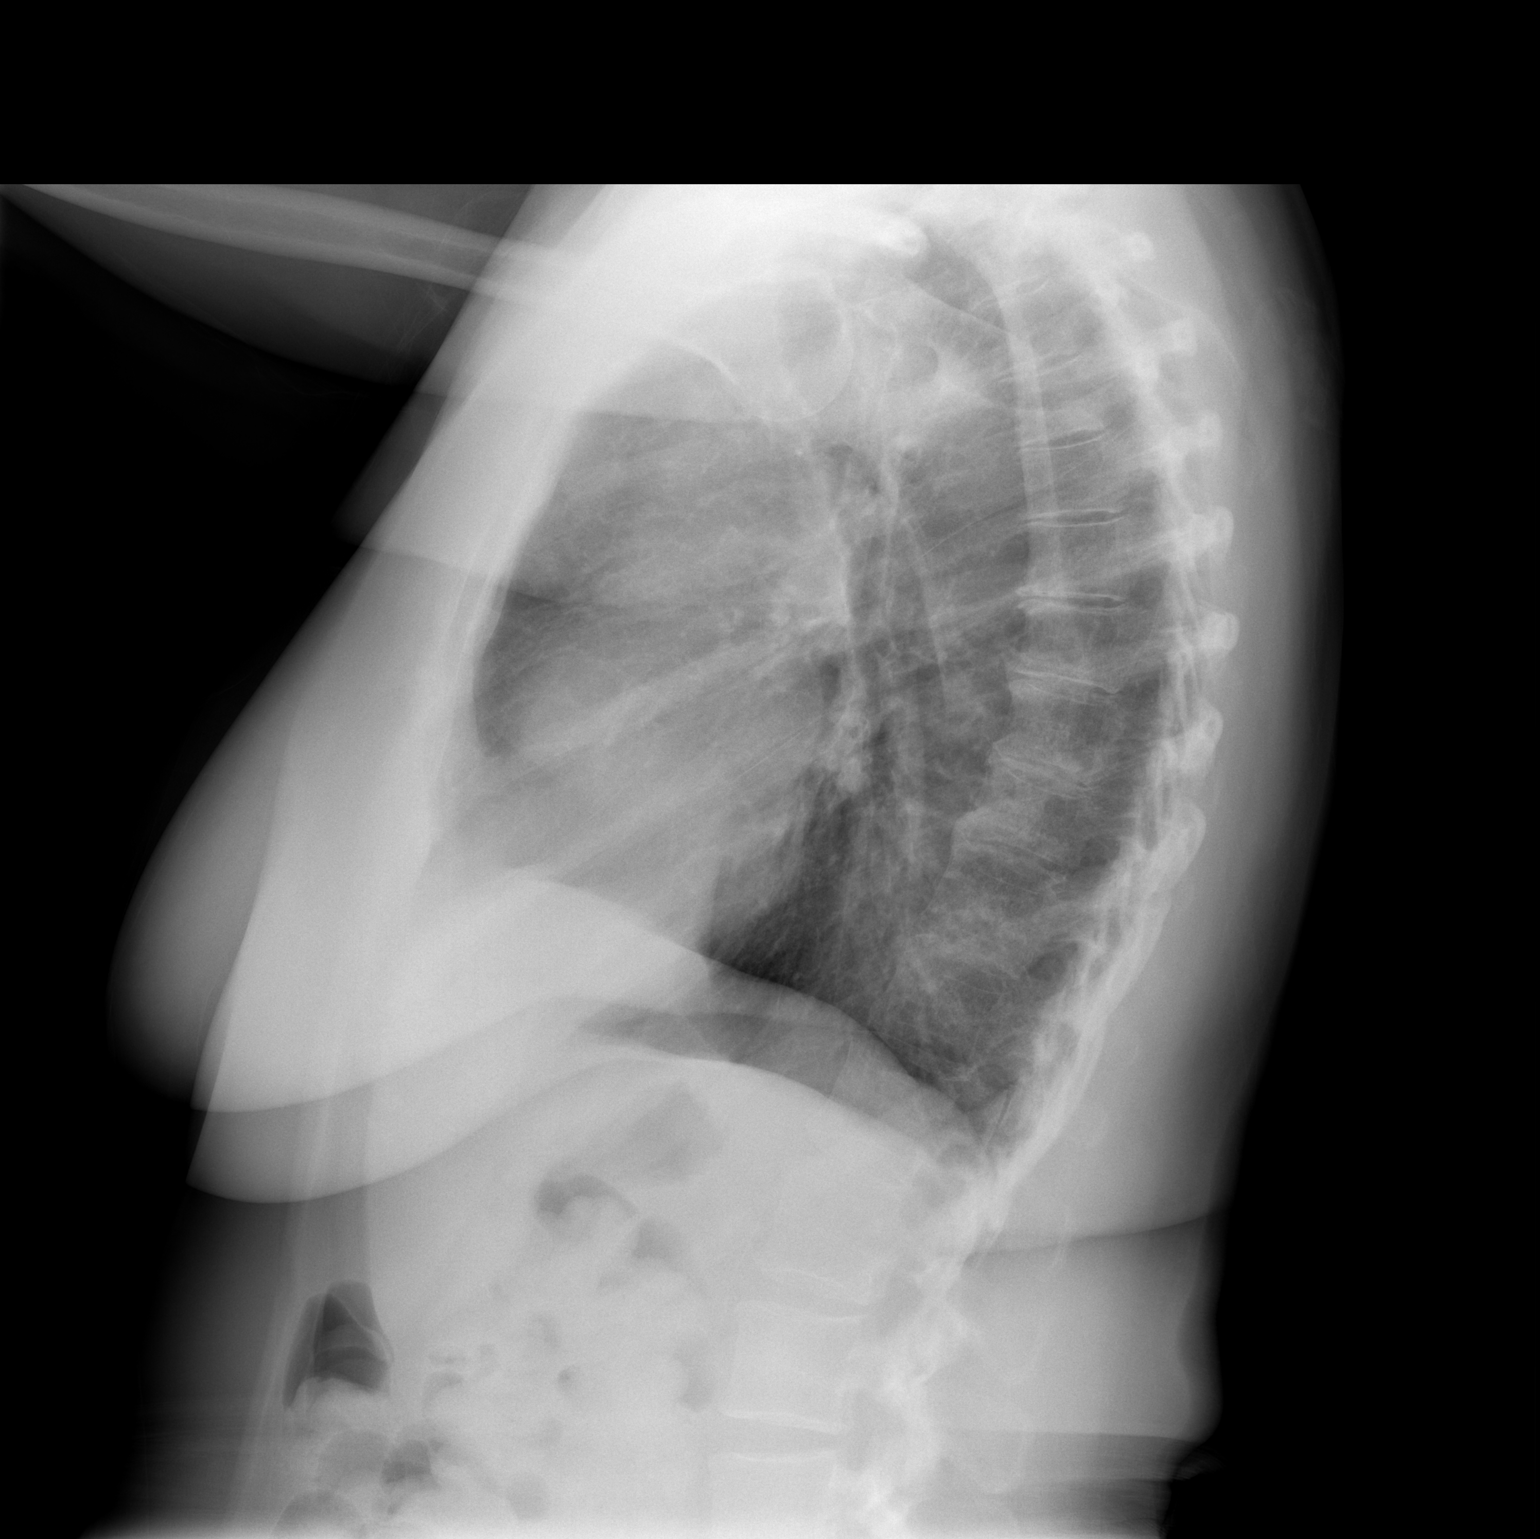

[2 of 2 positions shown; findings below may reference images not displayed]

FINDINGS: Stable mildly hyperinflated lungs.  Cardiac size and
mediastinal contours are within normal limits.  Visualized tracheal
air column is within normal limits.  Stable biapical scarring.
Otherwise, the lungs are clear. No acute osseous abnormality
identified.
IMPRESSION: No acute cardiopulmonary abnormality.

## 2011-05-09 ENCOUNTER — Other Ambulatory Visit: Payer: Self-pay | Admitting: Internal Medicine

## 2011-05-11 ENCOUNTER — Ambulatory Visit (INDEPENDENT_AMBULATORY_CARE_PROVIDER_SITE_OTHER): Payer: Medicare Other | Admitting: Internal Medicine

## 2011-05-11 ENCOUNTER — Encounter: Payer: Self-pay | Admitting: Internal Medicine

## 2011-05-11 DIAGNOSIS — L659 Nonscarring hair loss, unspecified: Secondary | ICD-10-CM | POA: Insufficient documentation

## 2011-05-11 DIAGNOSIS — J329 Chronic sinusitis, unspecified: Secondary | ICD-10-CM | POA: Insufficient documentation

## 2011-05-11 LAB — IBC PANEL: Transferrin: 308 mg/dL (ref 212.0–360.0)

## 2011-05-11 MED ORDER — DOXYCYCLINE HYCLATE 100 MG PO TABS: 100.0000 mg | ORAL_TABLET | Freq: Two times a day (BID) | ORAL | Status: AC

## 2011-05-11 NOTE — Assessment & Plan Note (Signed)
78 year old white female with possible signs and symptoms of chronic sinusitis. Treat with doxycycline 100 mg twice daily x10 days. Use nasal saline as directed.  Patient advised to call office if symptoms persist or worsen.

## 2011-05-11 NOTE — Patient Instructions (Signed)
Use nasal saline spray as directed Please call our office if your symptoms do not improve or gets worse.  

## 2011-05-11 NOTE — Assessment & Plan Note (Signed)
Mild generalized alopecia. Rule out underactive thyroid. Also check iron studies.

## 2011-05-11 NOTE — Progress Notes (Signed)
Subjective:    Patient ID: Carolyn Adams, female    DOB: Aug 03, 1933, 78 y.o.   MRN: [id number]  HPI  78 year old white female complains of chronic sinus pressure and nasal congestion x1 month. Her symptoms seem to be worse in the morning with lots of mucus production. She also notes notes nosebleeds in the morning. Her phlegm is a dark yellowish color. She denies fevers or chills.   She has also noticed generalized hair loss over the last 2 months. She is concerned that she has developed an underactive thyroid. Review of Systems    no significant weight change, denies constipation  Past Medical History  Diagnosis Date  . Hyperlipidemia   . Hypertension   . Osteoarthritis   . Vertigo     History   Social History  . Marital Status: Widowed    Spouse Name: N/A    Number of Children: N/A  . Years of Education: N/A   Occupational History  . 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 Smoking status: Never Smoker   . Smokeless tobacco: Never Used  . Alcohol Use: No  . Drug Use: Not on file  . Sexually Active: Not on file   Other Topics Concern  . Not on file   Social History Narrative  . No narrative on file    Past Surgical History  Procedure Date  . Abdominal hysterectomy     partial    Family History  Problem Relation Age of Onset  . Cancer Sister   . Coronary artery disease Brother     s/p 3 stents  . Heart disease Brother     s/p 3 stents  . Hypertension Other   . Cancer Other     lung  . Diabetes Neg Hx   . Heart attack Son     Allergies  Allergen Reactions  . Ampicillin     REACTION: Chest Pain  . Hydrocodone-Acetaminophen     REACTION: VOMITING  . Olmesartan Medoxomil     REACTION: stomach upset    Current Outpatient Prescriptions on File Prior to Visit  Medication Sig Dispense Refill  . Ascorbic Acid (VITAMIN C) 500 MG tablet Take 500 mg by mouth daily.        Marland Kitchen aspirin 81 MG EC tablet Take 81 mg by mouth  daily.        Marland Kitchen BYSTOLIC 5 MG tablet take 1/2 tablet by mouth once daily  30 tablet  5  . Cholecalciferol (VITAMIN D) 1000 UNITS capsule Take 2 capsules by mouth daily.       . citalopram (CELEXA) 10 MG tablet take 1 tablet by mouth once daily  30 tablet  3  . estropipate (OGEN) 0.75 MG tablet 1/2 tab daily      . pravastatin (PRAVACHOL) 40 MG tablet Take 1 tablet (40 mg total) by mouth daily.  90 tablet  1  . vitamin B-12 (CYANOCOBALAMIN) 100 MCG tablet Take 50 mcg by mouth daily.        Marland Kitchen triamcinolone cream (KENALOG) 0.1 % Apply topically 2 (two) times daily.  15 g  0    BP 140/92  Pulse 69  Temp(Src) 98.1 F (36.7 C) (Oral)  Wt 189 lb (85.73 kg)  SpO2 98%    Objective:   Physical Exam  Constitutional: She is oriented to person, place, and time. She appears well-developed and well-nourished.  HENT:  Head: Normocephalic and atraumatic.  Right Ear: External ear  normal.  Left Ear: External ear normal.  Mouth/Throat: Oropharynx is clear and moist.  Neck: Neck supple.  Cardiovascular: Normal rate, regular rhythm and normal heart sounds.   Pulmonary/Chest: Effort normal and breath sounds normal. She has no wheezes. She has no rales.  Lymphadenopathy:    She has no cervical adenopathy.  Neurological: She is alert and oriented to person, place, and time.          Assessment & Plan:

## 2011-05-13 NOTE — Progress Notes (Signed)
Quick Note:  Pt aware of results. ______ 

## 2011-05-16 ENCOUNTER — Ambulatory Visit: Payer: Medicare Other | Admitting: Internal Medicine

## 2011-07-01 ENCOUNTER — Ambulatory Visit: Payer: Medicare Other | Admitting: Internal Medicine

## 2011-07-01 ENCOUNTER — Telehealth: Payer: Self-pay | Admitting: Family Medicine

## 2011-07-01 NOTE — Telephone Encounter (Signed)
confidential Office Message [address] [place], [place] [address] p. [phone] f. [phone] To: [name] Fax: [phone] From: [facility] Date/ Time: 06/30/2011 6:06 PM Taken By: Ara Kussmaul, CSR Caller: Cristian Facility: not collected Patient: Carolyn Adams, Carolyn Adams DOB: 12/14/1933 Phone: [phone] Reason for Call: See info below Regarding Appointment: Yes Appt Date: 07/01/2011 Appt Time: 2:30:00 AM Provider: Thomos Lemons Reason: Details: Thinks she need to go to her GYN Outcome: Cancelled appointment in [facility])

## 2011-07-26 ENCOUNTER — Telehealth: Payer: Self-pay | Admitting: Internal Medicine

## 2011-07-26 NOTE — Telephone Encounter (Signed)
Pt called and is req a call back from Dr Artist Pais. Pt has questions re: when she had last colonoscopy and when Dr Artist Pais recommends pt to have the next one. Pls call asap.

## 2011-07-27 NOTE — Telephone Encounter (Signed)
Pt went to see GYN and she told GYN that she was having some stomach issues.  GYN told pt that she could tell her stool was hard.  Told pt she was not due for her colonoscopy till 12/08/12 but she could come in and talk to Dr Artist Pais and we could refer her to GI for a consultation.  Explained to pt that sometimes insurance would not cover a colonoscopy if it has not been 5-10 years.  Pt stated that she would do what GYN instructed her to do and if it didn't work she'd come in to see Dr Artist Pais.

## 2011-09-03 ENCOUNTER — Other Ambulatory Visit: Payer: Self-pay | Admitting: Internal Medicine

## 2011-09-07 ENCOUNTER — Encounter: Payer: Self-pay | Admitting: Internal Medicine

## 2011-09-07 ENCOUNTER — Ambulatory Visit (INDEPENDENT_AMBULATORY_CARE_PROVIDER_SITE_OTHER): Payer: Medicare Other | Admitting: Internal Medicine

## 2011-09-07 VITALS — BP 130/78 | HR 76 | Temp 97.9°F | Ht 61.0 in | Wt 185.0 lb

## 2011-09-07 DIAGNOSIS — K5900 Constipation, unspecified: Secondary | ICD-10-CM

## 2011-09-07 DIAGNOSIS — I10 Essential (primary) hypertension: Secondary | ICD-10-CM

## 2011-09-07 DIAGNOSIS — E785 Hyperlipidemia, unspecified: Secondary | ICD-10-CM

## 2011-09-07 DIAGNOSIS — Z0000 Encounter for general adult medical examination without abnormal findings: Secondary | ICD-10-CM

## 2011-09-07 LAB — BASIC METABOLIC PANEL
BUN: 15 mg/dL (ref 6–23)
CO2: 24 mEq/L (ref 19–32)
Chloride: 104 mEq/L (ref 96–112)
Creatinine, Ser: 1 mg/dL (ref 0.4–1.2)

## 2011-09-07 LAB — LIPID PANEL
Cholesterol: 230 mg/dL — ABNORMAL HIGH (ref 0–200)
Triglycerides: 244 mg/dL — ABNORMAL HIGH (ref 0.0–149.0)

## 2011-09-07 LAB — HEPATIC FUNCTION PANEL
ALT: 18 U/L (ref 0–35)
Albumin: 3.9 g/dL (ref 3.5–5.2)
Total Protein: 7.5 g/dL (ref 6.0–8.3)

## 2011-09-07 MED ORDER — PRAVASTATIN SODIUM 40 MG PO TABS: 40.0000 mg | ORAL_TABLET | Freq: Every day | ORAL | Status: DC

## 2011-09-07 MED ORDER — CITALOPRAM HYDROBROMIDE 10 MG PO TABS: 10.0000 mg | ORAL_TABLET | Freq: Every day | ORAL | Status: DC

## 2011-09-07 MED ORDER — NEBIVOLOL HCL 5 MG PO TABS: 5.0000 mg | ORAL_TABLET | Freq: Every day | ORAL | Status: DC

## 2011-09-07 NOTE — Assessment & Plan Note (Signed)
Reviewed adult health maintenance protocols.  Medicare Annual Wellness Visit questionnaire reviewed in detail. See attached sheets. Encouraged healthy diet and weight loss. She declines referral to nutritionist. She is up-to-date with adult vaccinations. She will be due for her followup colonoscopy in 2014.

## 2011-09-07 NOTE — Progress Notes (Signed)
Subjective:    Patient ID: Carolyn Adams, female    DOB: Aug 13, 1933, 78 y.o.   MRN: [id number]  HPI  78 year old white female with history of hypertension, hyperlipidemia and chronic GERD for routine Medicare physical. Medicare annual wellness physical questionnaire reviewed in detail with patient. She denies any recent history of fall. She does not have any difficulty with ADLs. She denies depressive symptoms. She denies hearing loss. No significant change in her short-term memory.  Immunizations and preventative health care work sheet reviewed. Her last colonoscopy was in 2004. She complained to her GYN of increasing constipation symptoms of loss last 6 months. Her stools are hard and slightly difficult to pass. She is currently using MiraLax daily.  Review of Systems  Constitutional: Negative for activity change, appetite change and unexpected weight change.  Eyes: Negative for visual disturbance.  Respiratory: Negative for cough, chest tightness and shortness of breath.   Cardiovascular: Negative for chest pain.  Genitourinary: Negative for difficulty urinating.  Neurological: Negative for headaches.  Gastrointestinal: Negative for abdominal pain, heartburn melena or hematochezia, positive for constipation Psych: Negative for depression or anxiety  Past Medical History  Diagnosis Date  . Hyperlipidemia   . Hypertension   . Osteoarthritis   . Vertigo     History   Social History  . Marital Status: Widowed    Spouse Name: N/A    Number of Children: N/A  . Years of Education: N/A   Occupational History  . 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 Smoking status: Never Smoker   . Smokeless tobacco: Never Used  . Alcohol Use: No  . Drug Use: Not on file  . Sexually Active: Not on file   Other Topics Concern  . Not on file   Social History Narrative  . No narrative on file    Past Surgical History  Procedure Date  . Abdominal  hysterectomy     partial    Family History  Problem Relation Age of Onset  . Cancer Sister   . Coronary artery disease Brother     s/p 3 stents  . Heart disease Brother     s/p 3 stents  . Hypertension Other   . Cancer Other     lung  . Diabetes Neg Hx   . Heart attack Son     Allergies  Allergen Reactions  . Ampicillin     REACTION: Chest Pain  . Hydrocodone-Acetaminophen     REACTION: VOMITING  . Olmesartan Medoxomil     REACTION: stomach upset    Current Outpatient Prescriptions on File Prior to Visit  Medication Sig Dispense Refill  . Ascorbic Acid (VITAMIN C) 500 MG tablet Take 500 mg by mouth daily.        Marland Kitchen aspirin 81 MG EC tablet Take 81 mg by mouth daily.        . Cholecalciferol (VITAMIN D) 1000 UNITS capsule Take 2 capsules by mouth daily.       Marland Kitchen estropipate (OGEN) 0.75 MG tablet 1/2 tab daily      . vitamin B-12 (CYANOCOBALAMIN) 100 MCG tablet Take 50 mcg by mouth daily.        Marland Kitchen DISCONTD: BYSTOLIC 5 MG tablet take 1/2 tablet by mouth once daily  30 tablet  5  . DISCONTD: citalopram (CELEXA) 10 MG tablet take 1 tablet by mouth once daily  30 tablet  3  . DISCONTD: pravastatin (PRAVACHOL) 40 MG tablet Take 1  tablet (40 mg total) by mouth daily.  90 tablet  1    BP 130/78  Pulse 76  Temp 97.9 F (36.6 C) (Oral)  Ht 5\' 1"  (1.549 m)  Wt 185 lb (83.915 kg)  BMI 34.96 kg/m2        Objective:   Physical Exam  Constitutional: She is oriented to person, place, and time. She appears well-developed and well-nourished.  HENT:  Head: Normocephalic and atraumatic.  Right Ear: External ear normal.  Left Ear: External ear normal.  Mouth/Throat: Oropharynx is clear and moist.  Eyes: EOM are normal. Pupils are equal, round, and reactive to light.  Neck: Neck supple. No thyromegaly present.       No carotid bruit  Cardiovascular: Normal rate, regular rhythm and normal heart sounds.   No murmur heard. Pulmonary/Chest: Effort normal and breath sounds normal.  She has no wheezes.  Abdominal: Soft. Bowel sounds are normal. She exhibits no mass. There is no tenderness.  Musculoskeletal: She exhibits no edema.  Neurological: She is alert and oriented to person, place, and time. No cranial nerve deficit.  Skin: Skin is warm and dry.  Psychiatric: She has a normal mood and affect. Her behavior is normal.          Assessment & Plan:

## 2011-09-07 NOTE — Patient Instructions (Addendum)
Use miralax twice daily as needed You can also use over the counter colace 100 mg twice daily

## 2011-09-07 NOTE — Assessment & Plan Note (Signed)
Patient complains of new onset constipation with last 5-6 months. GYN performed rectal exam which is reported normal. Continue MiraLax and add Colace. Proceed with surveillance colonoscopy in 2014.

## 2011-09-08 LAB — LDL CHOLESTEROL, DIRECT: Direct LDL: 145.9 mg/dL

## 2011-09-09 ENCOUNTER — Telehealth: Payer: Self-pay | Admitting: *Deleted

## 2011-09-09 NOTE — Telephone Encounter (Signed)
Message copied by Regis Bill on Fri Sep 09, 2011  4:36 PM ------      Message from: Meda Coffee      Created: Fri Sep 09, 2011  1:05 PM       Call pt - electrolytes,  kidney function, liver function tests all normal.  Total cholesterol 230, HDL 61, LDL 146, triglycerides 244.  Has patient been taking pravastatin consistently?

## 2011-09-09 NOTE — Telephone Encounter (Signed)
Patient informed; reports that she has not missed any doses of Pravastatin/SLS Please advise further instructions.

## 2011-09-12 NOTE — Telephone Encounter (Signed)
Ok

## 2011-09-12 NOTE — Telephone Encounter (Signed)
Her cholesterol in not at goal.  I suggest pt switch to atorvastatin 20 mg #30 one po qd.  RF x 3 She should return in 3 months for OV and LFTs and FLP. ( she can labs 2-3 days before OV - 272.4)

## 2011-09-12 NOTE — Telephone Encounter (Signed)
Pt wants to switch to Lipitor after she finishes her Pravastatin.  She just got her Pravastatin refilled Saturday

## 2011-09-12 NOTE — Telephone Encounter (Signed)
Pt aware, will call back when she needs they Lipitor

## 2011-11-02 ENCOUNTER — Encounter: Payer: Self-pay | Admitting: Internal Medicine

## 2011-11-02 ENCOUNTER — Ambulatory Visit (INDEPENDENT_AMBULATORY_CARE_PROVIDER_SITE_OTHER): Payer: Medicare Other | Admitting: Internal Medicine

## 2011-11-02 VITALS — BP 148/84 | Temp 97.9°F

## 2011-11-02 DIAGNOSIS — J069 Acute upper respiratory infection, unspecified: Secondary | ICD-10-CM

## 2011-11-02 MED ORDER — HYDROCODONE-HOMATROPINE 5-1.5 MG/5ML PO SYRP: 5.0000 mL | ORAL_SOLUTION | Freq: Three times a day (TID) | ORAL | Status: AC | PRN

## 2011-11-02 NOTE — Patient Instructions (Addendum)
Gargle with warm salt water twice daily Use nasal saline over the counter as directed Take mucinex DM during the day for cough Please call our office if your symptoms do not improve or gets worse.

## 2011-11-02 NOTE — Progress Notes (Signed)
Subjective:    Patient ID: Carolyn Adams, female    DOB: 1933/06/14, 78 y.o.   MRN: [id number]  URI  This is a new problem. The current episode started 1 to 4 weeks ago. The problem has been unchanged. There has been no fever. Associated symptoms include congestion and coughing. Pertinent negatives include no ear pain, plugged ear sensation, sinus pain or wheezing.   Cough is nonproductive.   Review of Systems  HENT: Positive for congestion. Negative for ear pain.   Respiratory: Positive for cough. Negative for wheezing.    Past Medical History  Diagnosis Date  . Hyperlipidemia   . Hypertension   . Osteoarthritis   . Vertigo     History   Social History  . Marital Status: Widowed    Spouse Name: N/A    Number of Children: N/A  . Years of Education: N/A   Occupational History  . 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 Smoking status: Never Smoker   . Smokeless tobacco: Never Used  . Alcohol Use: No  . Drug Use: Not on file  . Sexually Active: Not on file   Other Topics Concern  . Not on file   Social History Narrative  . No narrative on file    Past Surgical History  Procedure Date  . Abdominal hysterectomy     partial    Family History  Problem Relation Age of Onset  . Cancer Sister   . Coronary artery disease Brother     s/p 3 stents  . Heart disease Brother     s/p 3 stents  . Hypertension Other   . Cancer Other     lung  . Diabetes Neg Hx   . Heart attack Son     Allergies  Allergen Reactions  . Ampicillin     REACTION: Chest Pain  . Hydrocodone-Acetaminophen     REACTION: VOMITING  . Olmesartan Medoxomil     REACTION: stomach upset    Current Outpatient Prescriptions on File Prior to Visit  Medication Sig Dispense Refill  . Ascorbic Acid (VITAMIN C) 500 MG tablet Take 500 mg by mouth daily.        Marland Kitchen aspirin 81 MG EC tablet Take 81 mg by mouth daily.        . Cholecalciferol (VITAMIN D) 1000 UNITS  capsule Take 2 capsules by mouth daily.       . citalopram (CELEXA) 10 MG tablet Take 1 tablet (10 mg total) by mouth daily.  90 tablet  1  . estropipate (OGEN) 0.75 MG tablet 1/2 tab daily      . Multiple Vitamin (MULTIVITAMIN) tablet Take 1 tablet by mouth daily.      . nebivolol (BYSTOLIC) 5 MG tablet Take 1 tablet (5 mg total) by mouth daily.  45 tablet  1  . pravastatin (PRAVACHOL) 40 MG tablet Take 1 tablet (40 mg total) by mouth daily.  90 tablet  1  . vitamin B-12 (CYANOCOBALAMIN) 100 MCG tablet Take 50 mcg by mouth daily.          BP 148/84  Temp 97.9 F (36.6 C)  SpO2 99%       Objective:   Physical Exam  Constitutional: She is oriented to person, place, and time. She appears well-developed and well-nourished.  HENT:  Head: Normocephalic and atraumatic.  Mouth/Throat: No oropharyngeal exudate.       Oropharyngeal erythema  Cardiovascular: Normal rate, regular rhythm  and normal heart sounds.   Pulmonary/Chest: Effort normal and breath sounds normal. She has no wheezes.       No dullness to percussion  Neurological: She is alert and oriented to person, place, and time.  Skin: Skin is warm and dry.       Assessment & Plan:

## 2011-11-02 NOTE — Assessment & Plan Note (Signed)
78 year old white female with signs and symptoms of viral upper respiratory infection. I recommended symptomatic care.  Patient advised to call office if symptoms persist or worsen.

## 2011-11-25 ENCOUNTER — Ambulatory Visit (INDEPENDENT_AMBULATORY_CARE_PROVIDER_SITE_OTHER): Payer: Medicare Other | Admitting: Internal Medicine

## 2011-11-25 DIAGNOSIS — Z23 Encounter for immunization: Secondary | ICD-10-CM

## 2011-12-14 ENCOUNTER — Telehealth: Payer: Self-pay | Admitting: *Deleted

## 2011-12-14 ENCOUNTER — Encounter: Payer: Self-pay | Admitting: Internal Medicine

## 2011-12-14 ENCOUNTER — Ambulatory Visit (INDEPENDENT_AMBULATORY_CARE_PROVIDER_SITE_OTHER): Payer: Medicare Other | Admitting: Internal Medicine

## 2011-12-14 VITALS — BP 136/74 | Temp 97.9°F | Wt 187.0 lb

## 2011-12-14 DIAGNOSIS — J329 Chronic sinusitis, unspecified: Secondary | ICD-10-CM

## 2011-12-14 MED ORDER — HYDROCODONE-HOMATROPINE 5-1.5 MG/5ML PO SYRP: 5.0000 mL | ORAL_SOLUTION | Freq: Two times a day (BID) | ORAL | Status: DC | PRN

## 2011-12-14 MED ORDER — DOXYCYCLINE HYCLATE 100 MG PO TABS: 100.0000 mg | ORAL_TABLET | Freq: Two times a day (BID) | ORAL | Status: DC

## 2011-12-14 MED ORDER — MOMETASONE FUROATE 50 MCG/ACT NA SUSP: NASAL | Status: DC

## 2011-12-14 MED ORDER — CEFTRIAXONE SODIUM 500 MG IJ SOLR
500.0000 mg | Freq: Once | INTRAMUSCULAR | Status: AC
  Administered 2011-12-14: 500 mg via INTRAMUSCULAR

## 2011-12-14 NOTE — Telephone Encounter (Signed)
Opened in error

## 2011-12-14 NOTE — Assessment & Plan Note (Signed)
78 year old white female was seen for possible viral URI 1 month ago. She has persistent nasal congestion and sinus pressure. She also has purulent nasal discharge. Symptoms consistent with sinusitis. Treat with doxycycline 100 mg twice daily for 10 days. Patient also given 500 mg of Rocephin IM x1.  Continue to use intranasal saline spray over-the-counter and also use Nasonex 2 sprays into each nostril once daily.  Patient advised to call office if symptoms persist or worsen.

## 2011-12-14 NOTE — Patient Instructions (Signed)
Please call our office if your symptoms do not improve or gets worse.  

## 2011-12-14 NOTE — Progress Notes (Signed)
Subjective:    Patient ID: Carolyn Adams, female    DOB: 04-Dec-1933, 78 y.o.   MRN: [id number]  HPI  78 year old white female complains of persistent cough and nasal congestion. Her symptoms have been ongoing for almost a month. Patient reports bilateral sinus congestion with purulent nasal discharge. She also notes mild wheezing.   Review of Systems Negative for fever chills  Past Medical History  Diagnosis Date  . Hyperlipidemia   . Hypertension   . Osteoarthritis   . Vertigo     History   Social History  . Marital Status: Widowed    Spouse Name: N/A    Number of Children: N/A  . Years of Education: N/A   Occupational History  . 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 Smoking status: Never Smoker   . Smokeless tobacco: Never Used  . Alcohol Use: No  . Drug Use: Not on file  . Sexually Active: Not on file   Other Topics Concern  . Not on file   Social History Narrative  . No narrative on file    Past Surgical History  Procedure Date  . Abdominal hysterectomy     partial    Family History  Problem Relation Age of Onset  . Cancer Sister   . Coronary artery disease Brother     s/p 3 stents  . Heart disease Brother     s/p 3 stents  . Hypertension Other   . Cancer Other     lung  . Diabetes Neg Hx   . Heart attack Son     Allergies  Allergen Reactions  . Ampicillin     REACTION: Chest Pain  . Hydrocodone-Acetaminophen     REACTION: VOMITING  . Olmesartan Medoxomil     REACTION: stomach upset    Current Outpatient Prescriptions on File Prior to Visit  Medication Sig Dispense Refill  . Ascorbic Acid (VITAMIN C) 500 MG tablet Take 500 mg by mouth daily.        Marland Kitchen aspirin 81 MG EC tablet Take 81 mg by mouth daily.        . Cholecalciferol (VITAMIN D) 1000 UNITS capsule Take 2 capsules by mouth daily.       . citalopram (CELEXA) 10 MG tablet Take 1 tablet (10 mg total) by mouth daily.  90 tablet  1  . estropipate  (OGEN) 0.75 MG tablet 1/2 tab daily      . Multiple Vitamin (MULTIVITAMIN) tablet Take 1 tablet by mouth daily.      . nebivolol (BYSTOLIC) 5 MG tablet Take 1 tablet (5 mg total) by mouth daily.  45 tablet  1  . pravastatin (PRAVACHOL) 40 MG tablet Take 1 tablet (40 mg total) by mouth daily.  90 tablet  1  . vitamin B-12 (CYANOCOBALAMIN) 100 MCG tablet Take 50 mcg by mouth daily.        . mometasone (NASONEX) 50 MCG/ACT nasal spray Use 2 sprays into each nostril once daily  17 g  3   No current facility-administered medications on file prior to visit.    BP 136/74  Temp 97.9 F (36.6 C) (Oral)  Wt 187 lb (84.823 kg)       Objective:   Physical Exam  Constitutional: She is oriented to person, place, and time. She appears well-developed and well-nourished.  HENT:  Head: Normocephalic.  Right Ear: External ear normal.  Left Ear: External ear normal.  Oropharyngeal erythema  Neck:       Mild left-sided neck tenderness  Cardiovascular: Normal rate, regular rhythm and normal heart sounds.   Pulmonary/Chest: Effort normal and breath sounds normal. She has no wheezes.  Neurological: She is alert and oriented to person, place, and time.  Skin: Skin is warm and dry.          Assessment & Plan:

## 2011-12-20 ENCOUNTER — Other Ambulatory Visit: Payer: Self-pay | Admitting: Internal Medicine

## 2011-12-21 ENCOUNTER — Inpatient Hospital Stay (HOSPITAL_COMMUNITY)
Admission: EM | Admit: 2011-12-21 | Discharge: 2011-12-22 | DRG: 287 | Disposition: A | Discharge status: Home / Self Care | Payer: Medicare Other | Attending: Cardiology | Admitting: Cardiology

## 2011-12-21 ENCOUNTER — Encounter (HOSPITAL_COMMUNITY): Payer: Self-pay | Admitting: *Deleted

## 2011-12-21 ENCOUNTER — Emergency Department (HOSPITAL_COMMUNITY): Payer: Medicare Other

## 2011-12-21 DIAGNOSIS — I498 Other specified cardiac arrhythmias: Secondary | ICD-10-CM | POA: Diagnosis present

## 2011-12-21 DIAGNOSIS — I10 Essential (primary) hypertension: Secondary | ICD-10-CM | POA: Diagnosis present

## 2011-12-21 DIAGNOSIS — I2510 Atherosclerotic heart disease of native coronary artery without angina pectoris: Principal | ICD-10-CM | POA: Diagnosis present

## 2011-12-21 DIAGNOSIS — R079 Chest pain, unspecified: Secondary | ICD-10-CM

## 2011-12-21 DIAGNOSIS — E7800 Pure hypercholesterolemia, unspecified: Secondary | ICD-10-CM | POA: Diagnosis present

## 2011-12-21 DIAGNOSIS — I209 Angina pectoris, unspecified: Secondary | ICD-10-CM

## 2011-12-21 DIAGNOSIS — Z951 Presence of aortocoronary bypass graft: Secondary | ICD-10-CM

## 2011-12-21 DIAGNOSIS — M1990 Unspecified osteoarthritis, unspecified site: Secondary | ICD-10-CM | POA: Diagnosis present

## 2011-12-21 DIAGNOSIS — E785 Hyperlipidemia, unspecified: Secondary | ICD-10-CM | POA: Diagnosis present

## 2011-12-21 HISTORY — DX: Angina pectoris, unspecified: I20.9

## 2011-12-21 HISTORY — DX: Chronic sinusitis, unspecified: J32.9

## 2011-12-21 HISTORY — DX: Other chronic pain: G89.29

## 2011-12-21 HISTORY — DX: Headache: R51

## 2011-12-21 LAB — CBC WITH DIFFERENTIAL/PLATELET
Eosinophils Absolute: 0.7 10*3/uL (ref 0.0–0.7)
HCT: 37.8 % (ref 36.0–46.0)
Hemoglobin: 13 g/dL (ref 12.0–15.0)
Lymphs Abs: 2.5 10*3/uL (ref 0.7–4.0)
MCH: 30.9 pg (ref 26.0–34.0)
MCV: 89.8 fL (ref 78.0–100.0)
Monocytes Relative: 8 % (ref 3–12)
Neutrophils Relative %: 50 % (ref 43–77)
RBC: 4.21 MIL/uL (ref 3.87–5.11)

## 2011-12-21 LAB — CBC
HCT: 36.7 % (ref 36.0–46.0)
MCH: 30.7 pg (ref 26.0–34.0)
MCHC: 34.3 g/dL (ref 30.0–36.0)
RDW: 13.2 % (ref 11.5–15.5)

## 2011-12-21 LAB — COMPREHENSIVE METABOLIC PANEL
ALT: 15 U/L (ref 0–35)
Alkaline Phosphatase: 88 U/L (ref 39–117)
CO2: 22 mEq/L (ref 19–32)
Calcium: 10 mg/dL (ref 8.4–10.5)
GFR calc Af Amer: 66 mL/min — ABNORMAL LOW (ref >90)
GFR calc non Af Amer: 57 mL/min — ABNORMAL LOW (ref >90)
Glucose, Bld: 83 mg/dL (ref 70–99)
Sodium: 142 mEq/L (ref 135–145)

## 2011-12-21 LAB — BASIC METABOLIC PANEL
BUN: 14 mg/dL (ref 6–23)
Calcium: 9.8 mg/dL (ref 8.4–10.5)
GFR calc Af Amer: 62 mL/min — ABNORMAL LOW (ref >90)
GFR calc non Af Amer: 54 mL/min — ABNORMAL LOW (ref >90)
Potassium: 3.7 mEq/L (ref 3.5–5.1)

## 2011-12-21 LAB — TROPONIN I: Troponin I: <0.3 ng/mL (ref <0.30)

## 2011-12-21 LAB — POCT I-STAT TROPONIN I

## 2011-12-21 LAB — PROTIME-INR: INR: 1.15 (ref 0.00–1.49)

## 2011-12-21 MED ORDER — ASPIRIN 81 MG PO CHEW
324.0000 mg | CHEWABLE_TABLET | Freq: Once | ORAL | Status: AC
  Administered 2011-12-21: 324 mg via ORAL
  Filled 2011-12-21: qty 4

## 2011-12-21 MED ORDER — ASPIRIN 81 MG PO CHEW
324.0000 mg | CHEWABLE_TABLET | ORAL | Status: AC
  Administered 2011-12-22: 324 mg via ORAL
  Filled 2011-12-21: qty 1
  Filled 2011-12-21: qty 4
  Filled 2011-12-21: qty 1

## 2011-12-21 MED ORDER — SODIUM CHLORIDE 0.9 % IV SOLN: 250.0000 mL | INTRAVENOUS | Status: DC | PRN

## 2011-12-21 MED ORDER — HEPARIN SODIUM (PORCINE) 5000 UNIT/ML IJ SOLN: 60.0000 [IU]/kg | Freq: Once | INTRAMUSCULAR | Status: DC

## 2011-12-21 MED ORDER — NITROGLYCERIN IN D5W 200-5 MCG/ML-% IV SOLN: 10.0000 ug/min | Freq: Once | INTRAVENOUS | Status: DC

## 2011-12-21 MED ORDER — DIAZEPAM 5 MG PO TABS
5.0000 mg | ORAL_TABLET | ORAL | Status: AC
  Administered 2011-12-22: 5 mg via ORAL
  Filled 2011-12-21: qty 1

## 2011-12-21 MED ORDER — ATORVASTATIN CALCIUM 80 MG PO TABS
80.0000 mg | ORAL_TABLET | Freq: Every day | ORAL | Status: DC
  Administered 2011-12-21 – 2011-12-22 (×2): 80 mg via ORAL
  Filled 2011-12-21 (×2): qty 1

## 2011-12-21 MED ORDER — NITROGLYCERIN 0.4 MG SL SUBL: 0.4000 mg | SUBLINGUAL_TABLET | SUBLINGUAL | Status: DC | PRN

## 2011-12-21 MED ORDER — ASPIRIN 81 MG PO CHEW: 324.0000 mg | CHEWABLE_TABLET | ORAL | Status: DC

## 2011-12-21 MED ORDER — HEPARIN (PORCINE) IN NACL 100-0.45 UNIT/ML-% IJ SOLN
900.0000 [IU]/h | INTRAMUSCULAR | Status: DC
  Administered 2011-12-21: 1000 [IU]/h via INTRAVENOUS
  Filled 2011-12-21 (×2): qty 250

## 2011-12-21 MED ORDER — ASPIRIN 81 MG PO TBEC: 81.0000 mg | DELAYED_RELEASE_TABLET | Freq: Every day | ORAL | Status: DC

## 2011-12-21 MED ORDER — ASPIRIN 300 MG RE SUPP
300.0000 mg | RECTAL | Status: DC
  Filled 2011-12-21: qty 1

## 2011-12-21 MED ORDER — ACETAMINOPHEN 325 MG PO TABS: 650.0000 mg | ORAL_TABLET | ORAL | Status: DC | PRN

## 2011-12-21 MED ORDER — ASPIRIN EC 81 MG PO TBEC: 81.0000 mg | DELAYED_RELEASE_TABLET | Freq: Every day | ORAL | Status: DC

## 2011-12-21 MED ORDER — NITROGLYCERIN IN D5W 200-5 MCG/ML-% IV SOLN
10.0000 ug/min | INTRAVENOUS | Status: DC
  Administered 2011-12-21: 10 ug/min via INTRAVENOUS
  Filled 2011-12-21: qty 250

## 2011-12-21 MED ORDER — HEPARIN BOLUS VIA INFUSION
4000.0000 [IU] | Freq: Once | INTRAVENOUS | Status: AC
  Administered 2011-12-21: 4000 [IU] via INTRAVENOUS

## 2011-12-21 MED ORDER — SODIUM CHLORIDE 0.9 % IV SOLN
1.0000 mL/kg/h | INTRAVENOUS | Status: DC
  Administered 2011-12-21 – 2011-12-22 (×2): 1 mL/kg/h via INTRAVENOUS

## 2011-12-21 MED ORDER — CITALOPRAM HYDROBROMIDE 10 MG PO TABS
10.0000 mg | ORAL_TABLET | Freq: Every day | ORAL | Status: DC
  Administered 2011-12-22: 10 mg via ORAL
  Filled 2011-12-21: qty 1

## 2011-12-21 MED ORDER — ONDANSETRON HCL 4 MG/2ML IJ SOLN: 4.0000 mg | Freq: Four times a day (QID) | INTRAMUSCULAR | Status: DC | PRN

## 2011-12-21 MED ORDER — SODIUM CHLORIDE 0.9 % IJ SOLN: 3.0000 mL | INTRAMUSCULAR | Status: DC | PRN

## 2011-12-21 MED ORDER — NITROGLYCERIN 0.4 MG SL SUBL
0.4000 mg | SUBLINGUAL_TABLET | SUBLINGUAL | Status: DC | PRN
Start: 2011-12-21 — End: 2011-12-21
  Administered 2011-12-21 (×2): 0.4 mg via SUBLINGUAL
  Filled 2011-12-21: qty 25

## 2011-12-21 MED ORDER — ATORVASTATIN CALCIUM 80 MG PO TABS: 80.0000 mg | ORAL_TABLET | Freq: Every day | ORAL | Status: DC

## 2011-12-21 MED ORDER — SODIUM CHLORIDE 0.9 % IJ SOLN
3.0000 mL | Freq: Two times a day (BID) | INTRAMUSCULAR | Status: DC
  Administered 2011-12-21: 3 mL via INTRAVENOUS

## 2011-12-21 MED ORDER — PANTOPRAZOLE SODIUM 40 MG PO TBEC
40.0000 mg | DELAYED_RELEASE_TABLET | Freq: Every day | ORAL | Status: DC
  Administered 2011-12-21: 40 mg via ORAL
  Filled 2011-12-21: qty 1

## 2011-12-21 NOTE — ED Notes (Signed)
MD at bedside. Consult 

## 2011-12-21 NOTE — H&P (Signed)
Carolyn Adams is an 78 y.o. female.   Chief Complaint: Chest pain. HPI: Patient is 78 year old female with past medical history significant for hypertension, hypercholesteremia, osteoarthritis, came to the ER complaining of recurrent retrosternal chest pain described as pressure tightness localized off and on since last 2 days. Patient states chest tightness pressure happens with minimal exertion associated with diaphoresis relieved with rest. Patient received sublingual nitroglycerin in the ER with relief of chest pressure states chest pressure was grade 5/10. Denies any nausea vomiting diaphoresis. Denies any palpitation lightheadedness or syncope. Denies history of PND orthopnea leg swelling. Denies any cardiac workup in the past. Denies any claudication pain. Denies any weakness in the arms or legs. Patient denies any relation of chest pain to food breathing or movement.  Past Medical History  Diagnosis Date  . Hyperlipidemia   . Hypertension   . Osteoarthritis   . Vertigo     Past Surgical History  Procedure Date  . Abdominal hysterectomy     partial    Family History  Problem Relation Age of Onset  . Cancer Sister   . Coronary artery disease Brother     s/p 3 stents  . Heart disease Brother     s/p 3 stents  . Hypertension Other   . Cancer Other     lung  . Diabetes Neg Hx   . Heart attack Son    Social History:  reports that she has never smoked. She has never used smokeless tobacco. She reports that she does not drink alcohol or use illicit drugs.  Allergies:  Allergies  Allergen Reactions  . Ampicillin     REACTION: Chest Pain  . Hydrocodone-Acetaminophen     REACTION: VOMITING  . Olmesartan Medoxomil     REACTION: stomach upset     (Not in a hospital admission)  Results for orders placed during the hospital encounter of 12/21/11 (from the past 48 hour(s))  CBC     Status: Normal   Collection Time   12/21/11  3:11 PM      Component Value Range Comment   WBC 5.8  4.0 - 10.5 K/uL    RBC 4.11  3.87 - 5.11 MIL/uL    Hemoglobin 12.6  12.0 - 15.0 g/dL    HCT 96.2  95.2 - 84.1 %    MCV 89.3  78.0 - 100.0 fL    MCH 30.7  26.0 - 34.0 pg    MCHC 34.3  30.0 - 36.0 g/dL    RDW 32.4  40.1 - 02.7 %    Platelets 257  150 - 400 K/uL   BASIC METABOLIC PANEL     Status: Abnormal   Collection Time   12/21/11  3:11 PM      Component Value Range Comment   Sodium 139  135 - 145 mEq/L    Potassium 3.7  3.5 - 5.1 mEq/L    Chloride 105  96 - 112 mEq/L    CO2 23  19 - 32 mEq/L    Glucose, Bld 107 (*) 70 - 99 mg/dL    BUN 14  6 - 23 mg/dL    Creatinine, Ser 2.53  0.50 - 1.10 mg/dL    Calcium 9.8  8.4 - 66.4 mg/dL    GFR calc non Af Amer 54 (*) >90 mL/min    GFR calc Af Amer 62 (*) >90 mL/min   TROPONIN I     Status: Normal   Collection Time   12/21/11  3:11 PM  Component Value Range Comment   Troponin I <0.30  <0.30 ng/mL   POCT I-STAT TROPONIN I     Status: Normal   Collection Time   12/21/11  3:30 PM      Component Value Range Comment   Troponin i, poc 0.00  0.00 - 0.08 ng/mL    Comment 3             Dg Chest 2 View  12/21/2011  *RADIOLOGY REPORT*  Clinical Data: Mid chest pain beginning today  CHEST - 2 VIEW  Comparison: Chest x-ray of 09/04/2008  Findings: The lungs are clear and slightly hyperaerated. Mediastinal contours are stable.  The heart is within upper limits of normal.  There are degenerative changes throughout the thoracic spine with slight thoracic scoliosis present.  IMPRESSION: Stable chest x-ray.  No change in slight hyperaeration.  No active lung disease.   Original Report Authenticated By: Juline Patch, M.D.     Review of Systems  Constitutional: Negative for fever and chills.  Eyes: Negative for blurred vision and double vision.  Respiratory: Negative for cough, hemoptysis, sputum production and shortness of breath.   Cardiovascular: Positive for chest pain and palpitations. Negative for orthopnea, claudication and leg  swelling.  Gastrointestinal: Negative for nausea, vomiting and abdominal pain.  Genitourinary: Negative for dysuria.  Neurological: Negative for dizziness and headaches.    Blood pressure 132/59, pulse 49, temperature 98 F (36.7 C), temperature source Oral, resp. rate 18, height 5' 1.02" (1.55 m), weight 84.8 kg (186 lb 15.2 oz), SpO2 100.00%. Physical Exam  Constitutional: She is oriented to person, place, and time. She appears well-developed and well-nourished.  HENT:  Head: Normocephalic and atraumatic.  Eyes: Conjunctivae normal are normal. Pupils are equal, round, and reactive to light. Left eye exhibits no discharge. No scleral icterus.  Neck: Normal range of motion. Neck supple. No JVD present. No tracheal deviation present. No thyromegaly present.  Cardiovascular: Normal rate and regular rhythm.   Murmur (Soft systolic murmur noted no S3 gallop) heard. Respiratory: Effort normal and breath sounds normal. No respiratory distress. She has no wheezes. She has no rales.  GI: Soft. Bowel sounds are normal. She exhibits no distension. There is no tenderness. There is no rebound and no guarding.  Musculoskeletal: She exhibits no edema and no tenderness.  Neurological: She is alert and oriented to person, place, and time.     Assessment/Plan Acute coronary syndrome rule out MI Hypertension Hypercholesteremia Degenerative joint disease Plan As per orders Discussed with patient regarding various options of treatment i.e. noninvasive stress testing versus left cath possible PTCA stenting its risk and benefits i.e. death MI stroke need for emergency CABG local vascular complications etc. and consents for PCI   [facility] N 12/21/2011, 5:31 PM

## 2011-12-21 NOTE — ED Notes (Signed)
Patient transported to X-ray 

## 2011-12-21 NOTE — ED Provider Notes (Signed)
History     CSN: [id number]  Arrival date & time 12/21/11  1444   First MD Initiated Contact with Patient 12/21/11 1521      Chief Complaint  Patient presents with  . Chest Pain    (Consider location/radiation/quality/duration/timing/severity/associated sxs/prior treatment) Patient is a 78 y.o. female presenting with chest pain. The history is provided by the patient.  Chest Pain The chest pain began 2 days ago. Chest pain occurs constantly. The chest pain is unchanged. The pain is associated with exertion. The severity of the pain is moderate. The quality of the pain is described as dull, heavy and tightness. The pain does not radiate. Chest pain is worsened by exertion. Pertinent negatives for primary symptoms include no fever, no fatigue, no syncope, no shortness of breath, no cough, no wheezing, no palpitations, no abdominal pain, no nausea, no vomiting, no dizziness and no altered mental status.  Associated symptoms include diaphoresis (when attempting to work out in the yard.).  Pertinent negatives for associated symptoms include no lower extremity edema and no near-syncope. She tried nothing for the symptoms. Risk factors include being elderly and post-menopausal.  Her past medical history is significant for hypertension.  Pertinent negatives for family medical history include: no CAD in family and no early MI in family.     Past Medical History  Diagnosis Date  . Hyperlipidemia   . Hypertension   . Osteoarthritis   . Vertigo     Past Surgical History  Procedure Date  . Abdominal hysterectomy     partial    Family History  Problem Relation Age of Onset  . Cancer Sister   . Coronary artery disease Brother     s/p 3 stents  . Heart disease Brother     s/p 3 stents  . Hypertension Other   . Cancer Other     lung  . Diabetes Neg Hx   . Heart attack Son     History  Substance Use Topics  . Smoking status: Never Smoker   . Smokeless tobacco: Never Used  .  Alcohol Use: No    OB History    Grav Para Term Preterm Abortions TAB SAB Ect Mult Living                  Review of Systems  Constitutional: Positive for diaphoresis (when attempting to work out in the yard.). Negative for fever and fatigue.  Respiratory: Negative for cough, shortness of breath and wheezing.   Cardiovascular: Positive for chest pain. Negative for palpitations, syncope and near-syncope.  Gastrointestinal: Negative for nausea, vomiting, abdominal pain and diarrhea.  Neurological: Negative for dizziness and headaches.  Psychiatric/Behavioral: Negative for altered mental status.  All other systems reviewed and are negative.    Allergies  Ampicillin; Hydrocodone-acetaminophen; and Olmesartan medoxomil  Home Medications   Current Outpatient Rx  Name Route Sig Dispense Refill  . VITAMIN C 500 MG PO TABS Oral Take 500 mg by mouth daily.      . ASPIRIN 81 MG PO TBEC Oral Take 81 mg by mouth daily.      Marland Kitchen BYSTOLIC 5 MG PO TABS  take 1 tablet by mouth once daily 30 each 3  . VITAMIN D 1000 UNITS PO CAPS  Take 2 capsules by mouth daily.     Marland Kitchen CITALOPRAM HYDROBROMIDE 10 MG PO TABS Oral Take 1 tablet (10 mg total) by mouth daily. 90 tablet 1  . DOXYCYCLINE HYCLATE 100 MG PO TABS Oral Take 1  tablet (100 mg total) by mouth 2 (two) times daily. 20 tablet 0  . ESTROPIPATE 0.75 MG PO TABS  1/2 tab daily    . HYDROCODONE-HOMATROPINE 5-1.5 MG/5ML PO SYRP Oral Take 5 mLs by mouth every 12 (twelve) hours as needed for cough. 120 mL 0  . MOMETASONE FUROATE 50 MCG/ACT NA SUSP  Use 2 sprays into each nostril once daily 17 g 3  . ONE-DAILY MULTI VITAMINS PO TABS Oral Take 1 tablet by mouth daily.    Marland Kitchen PRAVASTATIN SODIUM 40 MG PO TABS Oral Take 1 tablet (40 mg total) by mouth daily. 90 tablet 1  . VITAMIN B-12 100 MCG PO TABS Oral Take 50 mcg by mouth daily.        BP 149/80  Pulse 59  Temp 98 F (36.7 C) (Oral)  Resp 18  SpO2 99%  Physical Exam  Nursing note and vitals  reviewed. Constitutional: She is oriented to person, place, and time. She appears well-developed and well-nourished. No distress.  HENT:  Head: Normocephalic and atraumatic.  Eyes: EOM are normal. Pupils are equal, round, and reactive to light.  Neck: Normal range of motion.  Cardiovascular: Normal rate and normal heart sounds.   Pulmonary/Chest: Effort normal and breath sounds normal. No respiratory distress. She exhibits no tenderness.  Abdominal: Soft. She exhibits no distension. There is no tenderness.  Musculoskeletal: Normal range of motion.  Neurological: She is alert and oriented to person, place, and time.  Skin: Skin is warm and dry.    ED Course  Procedures (including critical care time)  Labs Reviewed  BASIC METABOLIC PANEL - Abnormal; Notable for the following:    Glucose, Bld 107 (*)     GFR calc non Af Amer 54 (*)     GFR calc Af Amer 62 (*)     All other components within normal limits  APTT - Abnormal; Notable for the following:    aPTT 110 (*)     All other components within normal limits  CBC WITH DIFFERENTIAL - Abnormal; Notable for the following:    Eosinophils Relative 9 (*)     All other components within normal limits  COMPREHENSIVE METABOLIC PANEL - Abnormal; Notable for the following:    GFR calc non Af Amer 57 (*)     GFR calc Af Amer 66 (*)     All other components within normal limits  CBC  TROPONIN I  POCT I-STAT TROPONIN I  TROPONIN I  PROTIME-INR  MAGNESIUM  HEPARIN LEVEL (UNFRACTIONATED)  TROPONIN I  TROPONIN I  TSH  CBC  BASIC METABOLIC PANEL  LIPID PANEL   Dg Chest 2 View  12/21/2011  *RADIOLOGY REPORT*  Clinical Data: Mid chest pain beginning today  CHEST - 2 VIEW  Comparison: Chest x-ray of 09/04/2008  Findings: The lungs are clear and slightly hyperaerated. Mediastinal contours are stable.  The heart is within upper limits of normal.  There are degenerative changes throughout the thoracic spine with slight thoracic scoliosis  present.  IMPRESSION: Stable chest x-ray.  No change in slight hyperaeration.  No active lung disease.   Original Report Authenticated By: Juline Patch, M.D.    Date: 12/21/2011  Rate: 59  Rhythm: sinus bradycardia  QRS Axis: normal  Intervals: normal  ST/T Wave abnormalities: normal  Conduction Disutrbances:none  Narrative Interpretation: sinus bradycardia, no signs of acute ischemia  Old EKG Reviewed: unchanged     1. Chest pain       MDM  3:31 PM PT seen and examined. Pt with two days of chest tightness worse with exertion. Concern for ACS. PT has not tried taking anything for her pain, so will give ASA and NTG. Will work up for ACS. Patient also on doxy for "flu-like symptoms" so will get CXR to ensure no pneumonia. Pt will likely need observation for ACS r/o.  4:11 PM Dr. Sharyn Lull (cardiology) has requested starting heparin and NTG. He is coming to see the patient.  5:31 PM Pt will be placed in observation with cardiology.  Daleen Bo, MD 12/22/11 [phone]

## 2011-12-21 NOTE — ED Notes (Signed)
PT states uncomfortable feeling in mid chest without radiation.  Pt reports some sob with exertion.

## 2011-12-21 NOTE — Progress Notes (Signed)
ANTICOAGULATION CONSULT NOTE - Initial Consult  Pharmacy Consult for Heparin Indication: chest pain/ACS  Allergies  Allergen Reactions  . Ampicillin     REACTION: Chest Pain  . Hydrocodone-Acetaminophen     REACTION: VOMITING  . Olmesartan Medoxomil     REACTION: stomach upset    Patient Measurements:   Heparin Dosing Weight: 67.3 kg  Vital Signs: Temp: 98 F (36.7 C) (10/02 1449) Temp src: Oral (10/02 1449) BP: 131/58 mmHg (10/02 1627) Pulse Rate: 50  (10/02 1627)  Labs:  Basename 12/21/11 1511  HGB 12.6  HCT 36.7  PLT 257  APTT --  LABPROT --  INR --  HEPARINUNFRC --  CREATININE 1.00  CKTOTAL --  CKMB --  TROPONINI <0.30    The CrCl is unknown because both a height and weight (above a minimum accepted value) are required for this calculation.   Medical History: Past Medical History  Diagnosis Date  . Hyperlipidemia   . Hypertension   . Osteoarthritis   . Vertigo     Medications:  Scheduled:    . aspirin  324 mg Oral Once  . heparin  4,000 Units Intravenous Once  . nitroGLYCERIN  10 mcg/min Intravenous Once  . DISCONTD: heparin  60 Units/kg Intravenous Once    Assessment: 78 yr old female presented to ED with 2 days of chest pain Pt to start heparin and ACS workup. Goal of Therapy:  Heparin level 0.3-0.7 units/ml Monitor platelets by anticoagulation protocol: Yes   Plan:  Heparin 4000 units bolus, then heparin drip at 1000 units/hr Check heparin level in 8 hrs. Daily AM heparin level and CBC while on heparin.  Eugene Garnet 12/21/2011,5:05 PM

## 2011-12-22 ENCOUNTER — Encounter (HOSPITAL_COMMUNITY)
Admission: EM | Disposition: A | Discharge status: Home / Self Care | Payer: Self-pay | Source: Home / Self Care | Attending: Cardiology

## 2011-12-22 HISTORY — PX: LEFT HEART CATHETERIZATION WITH CORONARY ANGIOGRAM: SHX5451

## 2011-12-22 LAB — CBC
MCH: 30.5 pg (ref 26.0–34.0)
MCHC: 33.7 g/dL (ref 30.0–36.0)
Platelets: 209 10*3/uL (ref 150–400)
RBC: 3.74 MIL/uL — ABNORMAL LOW (ref 3.87–5.11)
RDW: 13.3 % (ref 11.5–15.5)

## 2011-12-22 LAB — LIPID PANEL
Cholesterol: 192 mg/dL (ref 0–200)
Triglycerides: 139 mg/dL (ref <150)

## 2011-12-22 LAB — BASIC METABOLIC PANEL
Calcium: 9.3 mg/dL (ref 8.4–10.5)
GFR calc non Af Amer: 56 mL/min — ABNORMAL LOW (ref >90)
Sodium: 139 mEq/L (ref 135–145)

## 2011-12-22 SURGERY — LEFT HEART CATHETERIZATION WITH CORONARY ANGIOGRAM
Anesthesia: LOCAL

## 2011-12-22 MED ORDER — OXYCODONE-ACETAMINOPHEN 5-325 MG PO TABS: 1.0000 | ORAL_TABLET | ORAL | Status: DC | PRN

## 2011-12-22 MED ORDER — MIDAZOLAM HCL 2 MG/2ML IJ SOLN
INTRAMUSCULAR | Status: AC
  Filled 2011-12-22: qty 2

## 2011-12-22 MED ORDER — LIDOCAINE HCL (PF) 1 % IJ SOLN
INTRAMUSCULAR | Status: AC
  Filled 2011-12-22: qty 30

## 2011-12-22 MED ORDER — SODIUM CHLORIDE 0.9 % IV SOLN
INTRAVENOUS | Status: DC
  Administered 2011-12-22: 15:00:00 via INTRAVENOUS

## 2011-12-22 MED ORDER — HEPARIN (PORCINE) IN NACL 2-0.9 UNIT/ML-% IJ SOLN
INTRAMUSCULAR | Status: AC
  Filled 2011-12-22: qty 1000

## 2011-12-22 MED ORDER — ONDANSETRON HCL 4 MG/2ML IJ SOLN: 4.0000 mg | Freq: Four times a day (QID) | INTRAMUSCULAR | Status: DC | PRN

## 2011-12-22 MED ORDER — ACETAMINOPHEN 325 MG PO TABS: 650.0000 mg | ORAL_TABLET | ORAL | Status: DC | PRN

## 2011-12-22 MED ORDER — NITROGLYCERIN 0.2 MG/ML ON CALL CATH LAB
INTRAVENOUS | Status: AC
  Filled 2011-12-22: qty 1

## 2011-12-22 MED ORDER — FENTANYL CITRATE 0.05 MG/ML IJ SOLN
INTRAMUSCULAR | Status: AC
  Filled 2011-12-22: qty 2

## 2011-12-22 NOTE — CV Procedure (Signed)
Left cardiac cath report dictated on 12/22/2011 dictation number is [phone]

## 2011-12-22 NOTE — Discharge Summary (Signed)
  Discharge summary dictated on 12/22/2011 dictation number is [phone]

## 2011-12-22 NOTE — H&P (Signed)
No change in H&P since yesterday

## 2011-12-22 NOTE — Progress Notes (Signed)
ANTICOAGULATION CONSULT NOTE - Follow Up Consult  Pharmacy Consult for heparin Indication: chest pain/ACS  Labs:  Basename 12/22/11 4098 12/22/11 0828 12/22/11 0134 12/22/11 0132 12/22/11 0131 12/21/11 2036 12/21/11 1511  HGB -- -- -- 11.4* -- 13.0 --  HCT -- -- -- 33.8* -- 37.8 36.7  PLT -- -- -- 209 -- 242 257  APTT -- -- -- -- -- 110* --  LABPROT -- -- -- -- -- 14.5 --  INR -- -- -- -- -- 1.15 --  HEPARINUNFRC 0.68 -- 0.39 -- -- -- --  CREATININE -- -- -- 0.97 -- 0.95 1.00  CKTOTAL -- -- -- -- -- -- --  CKMB -- -- -- -- -- -- --  TROPONINI -- <0.30 -- -- <0.30 <0.30 --    Assessment/Plan:  77yo female on heparin to r/o ACS. Confirmation level on the higher end of therapeutic range. Will be cautious of accumulation. No bleeding noted. Plts ok at 209.   Decrease heparin drip to 900units/hr (9 units/hr) and f/u with 8 hour heparin level, timed collect.    Thank you,  Brett Fairy, PharmD 12/22/2011 10:26 AM

## 2011-12-22 NOTE — ED Provider Notes (Signed)
I saw and evaluated the patient, reviewed the resident's note and I agree with the findings and plan.   Tationa Stech, MD 12/22/11 1532 

## 2011-12-22 NOTE — Progress Notes (Signed)
ANTICOAGULATION CONSULT NOTE - Follow Up Consult  Pharmacy Consult for heparin Indication: chest pain/ACS  Labs:  Basename 12/22/11 0134 12/22/11 0132 12/21/11 2036 12/21/11 1511  HGB -- 11.4* 13.0 --  HCT -- 33.8* 37.8 36.7  PLT -- 209 242 257  APTT -- -- 110* --  LABPROT -- -- 14.5 --  INR -- -- 1.15 --  HEPARINUNFRC 0.39 -- -- --  CREATININE -- -- 0.95 1.00  CKTOTAL -- -- -- --  CKMB -- -- -- --  TROPONINI -- -- <0.30 <0.30    Assessment/Plan:  78yo female therapeutic on heparin with initial dosing for CP.  Will continue gtt at current rate and confirm stable with additional level.  Colleen Can PharmD BCPS 12/22/2011,2:25 AM

## 2011-12-22 NOTE — Care Management Note (Unsigned)
    Page 1 of 1   12/22/2011     1:30:56 PM   CARE MANAGEMENT NOTE 12/22/2011  Patient:  Carolyn Adams, Carolyn Adams   Account Number:  [id number]  Date Initiated:  12/22/2011  Documentation initiated by:  SIMMONS,Delpha Perko  Subjective/Objective Assessment:   ADMITTED WITH CP; LIVES AT HOME ALONE; WAS IPTA.     Action/Plan:   DISCHARGE PLANNING INITIATED.   Anticipated DC Date:  12/23/2011   Anticipated DC Plan:  HOME/SELF CARE      DC Planning Services  CM consult      Choice offered to / List presented to:             Status of service:  In process, will continue to follow Medicare Important Message given?   (If response is "NO", the following Medicare IM given date fields will be blank) Date Medicare IM given:   Date Additional Medicare IM given:    Discharge Disposition:    Per UR Regulation:  Reviewed for med. necessity/level of care/duration of stay  If discussed at Long Length of Stay Meetings, dates discussed:    Comments:  12/22/11  1043  Maelin Kurkowski SIMMONS RN, BSN [phone] NCM WILL FOLLOW.

## 2011-12-23 NOTE — Cardiovascular Report (Signed)
NAMESAARAH, Carolyn Adams            ACCOUNT NO.:  [id number]  MEDICAL RECORD NO.:  [id number]  LOCATION:  2022                         FACILITY:  [facility]  PHYSICIAN:  Eduardo Osier. Sharyn Lull, M.D. DATE OF BIRTH:  1934-01-09  DATE OF PROCEDURE:  12/22/2011 DATE OF DISCHARGE:  12/22/2011                           CARDIAC CATHETERIZATION   PROCEDURE:  Left cardiac cath with selective left and right coronary angiography, LV graphy via right groin using Judkins technique.  INDICATION FOR THE PROCEDURE:  Carolyn Adams is a 78 year old female with past medical history significant for hypertension, hypercholesteremia, osteoarthritis.  She came to the ER complaining of recurrent retrosternal chest pain described as pressure, tightness, localized off and on since last 2 days.  The patient states chest tightness/pressure happens with minimal exertion associated with diaphoresis, relieves with rest.  The patient received sublingual nitro in the ER with relief of chest pain.  States chest pressure was grade 5/10.  Denies any nausea, vomiting, diaphoresis.  Denies palpitation, lightheadedness, or syncope. Denies history of PND, orthopnea, or leg swelling.  Denies cardiac workup in the past.  Denies any claudication pain.  Denies any weakness in the arms or legs.  Denies any relation of chest pain to food, breathing, or movement.  The patient was admitted to telemetry unit.  MI was ruled out by serial enzymes and EKG.  Discussed with the patient regarding various options of treatment, i.e., noninvasive stress testing versus left cath, possible PTCA stenting, its risks and benefits, i.e., death, MI, stroke, need for emergency CABG, local vascular complications, etc., and consented for PCI.  PROCEDURE:  After obtaining informed consent, the patient was brought to the cath lab and was placed on fluoroscopy table.  Right groin was prepped and draped in usual fashion.  Xylocaine 1% was used for local anesthesia  in the right groin.  With the help of thin-wall needle, 6- French arterial sheath was placed.  The sheath was aspirated and flushed.  Next, 6-French left Judkins catheter was advanced over the wire under fluoroscopic guidance up to the ascending aorta.  Wire was pulled out.  The catheter was aspirated and connected to the Manifold. Catheter was further advanced and engaged into left coronary ostium. Multiple views of the left system were taken.  Next, catheter was disengaged and was pulled out over the wire and was replaced with 6- [place] right Judkins catheter, which was advanced over the wire under fluoroscopic guidance up to the ascending aorta.  Wire was pulled out. The catheter was aspirated and connected to the Manifold.  Catheter was further advanced and engaged into right coronary ostium.  Multiple views of the right system were taken.  Next, catheter was disengaged and was pulled out over the wire and was replaced with 6-French pigtail catheter, which was advanced over the wire under fluoroscopic guidance up to the ascending aorta.  Wire was pulled out.  The catheter was aspirated and connected to the Manifold.  Catheter was further advanced across the aortic valve into the LV.  LV pressures were recorded.  Next, LV graft was done in 30-degree RAO position.  Post angiographic pressures were recorded from LV and then pullback pressures were recorded from the aorta.  There was no gradient across the aortic valve. Next, the pigtail catheter was pulled out over the wire.  Sheaths were aspirated and flushed.  FINDINGS:  LV showed good LV systolic function.  EF of 55% to 60%.  Left main was long, which was patent LAD has 5% to 10% mid stenosis. Diagonal 1 was very small, which was patent.  Diagonal 2 was small, which was patent.  Left circumflex was patent.  OM 1 was moderate size, which was patent.  RCA has 30% to 40% ostial and 15% to 20% mid stenosis.  PDA is small, which is  patent.  PLV branch is moderate size, which is patent.  The patient tolerated procedure well. There were no complications.  The patient was transferred to recovery room in stable condition.     Eduardo Osier. Sharyn Lull, M.D.     MNH/MEDQ  D:  12/22/2011  T:  12/23/2011  Job:  [id number]

## 2011-12-23 NOTE — Discharge Summary (Signed)
Carolyn Adams, Carolyn Adams            ACCOUNT NO.:  [id number]  MEDICAL RECORD NO.:  [id number]  LOCATION:  2022                         FACILITY:  [facility]  PHYSICIAN:  Eduardo Osier. Sharyn Lull, M.D. DATE OF BIRTH:  Dec 28, 1933  DATE OF ADMISSION:  12/21/2011 DATE OF DISCHARGE:  12/22/2011                              DISCHARGE SUMMARY   ADMITTING DIAGNOSES: 1. Acute coronary syndrome, rule out myocardial infarction. 2. Hypertension. 3. Hypercholesteremia. 4. Degenerative joint disease.  DISCHARGE DIAGNOSES: 1. Mild coronary artery disease, status post left catheterization. 2. Hypertension. 3. Hypercholesteremia. 4. Degenerative joint disease.  DISCHARGE HOME MEDICATIONS: 1. Enteric-coated aspirin 81 mg 1 tablet daily. 2. Citalopram 10 mg 1 tablet daily. 3. Ogen 0.375 mg by mouth daily. 4. Nasonex 2 sprays once daily as before. 5. Bystolic 5 mg 1 tablet daily. 6. Pravastatin 40 mg 1 tablet daily. 7. Vitamin B12 100 mcg tablet daily as before. 8. Vitamin C 1 tablet daily as before.  DIET:  Low salt, low cholesterol.  ACTIVITY:  Increase activity slowly as tolerated.  Post-cardiac cath instructions have been given.  Follow up with me in 1 week.  CONDITION AT DISCHARGE:  Stable.  BRIEF HISTORY AND HOSPITAL COURSE:  Carolyn Adams is a 78 year old female with past medical history significant for hypertension, hypercholesteremia, osteoarthritis.  She came to the ER complaining of recurrent retrosternal chest pain described as pressure, tightness, localized off and on since last 2 days.  The patient states chest tightness/pressure happens with minimal exertion associated with diaphoresis, relieves with rest.  The patient received sublingual nitro in the ER with relief of chest pressure.  States chest pressure was grade 5/10.  Denies any nausea, vomiting, diaphoresis.  Denies palpitation, lightheadedness, or syncope.  Denies history of PND, orthopnea, or leg swelling.  Denies any cardiac  workup in the past. Denies any claudication pain.  Denies any weakness in the arms or legs. Denies any relation of chest pain to food, breathing, or movement.  PAST MEDICAL HISTORY:  As above.  PAST SURGICAL HISTORY:  She had abdominal hysterectomy in the past.  FAMILY HISTORY:  Positive for coronary artery disease.  PHYSICAL EXAMINATION:  GENERAL:  She was alert, awake, oriented x3. VITAL SIGNS:  Blood pressure was 132/59, pulse was 49 and regular, and she was afebrile, sinus brady on the monitor. HEENT:  Conjunctivae were pink. NECK:  Supple.  No JVD.  No bruit. LUNGS:  Clear to auscultation without rhonchi or rales. CARDIOVASCULAR:  S1 and S2 were normal.  There was soft systolic murmur. No S3 or S4 gallop. ABDOMEN:  Soft.  Bowel sounds are present.  Nontender. EXTREMITIES:  There is no clubbing, cyanosis, or edema.  LABORATORY DATA:  Three sets of cardiac enzymes were negative.  Sodium was 139, potassium 3.7, BUN 14, creatinine 1.00.  Cholesterol was 192, triglycerides 139, LDL 111, HDL was 53.  Hemoglobin was 12.6, hematocrit 36.7, white count of 5.8.  Her EKG showed sinus bradycardia with no acute ischemic changes.  BRIEF HOSPITAL COURSE:  The patient was admitted to telemetry unit.  MI was ruled out by serial enzymes and EKG.  The patient subsequently underwent left cardiac cath with selective left and right coronary angiography, LV  graphy via right groin using Judkins technique.  As per procedure report, the patient tolerated procedure well.  There were no complications.  Postprocedure, the patient did not have any episodes of chest pain.  During the hospital stay, her groin was stable with no evidence of hematoma or bruit.  The patient will be discharged home on above medications and will be followed up in my office in 1 week.  The patient has been given post-cardiac cath instructions.  The patient has been advised for lifestyle modification and heart-healthy  diet.     Eduardo Osier. Sharyn Lull, M.D.     MNH/MEDQ  D:  12/22/2011  T:  12/23/2011  Job:  [id number]

## 2012-02-09 ENCOUNTER — Encounter: Payer: Self-pay | Admitting: Internal Medicine

## 2012-02-09 ENCOUNTER — Ambulatory Visit (INDEPENDENT_AMBULATORY_CARE_PROVIDER_SITE_OTHER): Payer: Medicare Other | Admitting: Internal Medicine

## 2012-02-09 VITALS — BP 132/74 | Temp 97.7°F | Wt 182.0 lb

## 2012-02-09 DIAGNOSIS — K1230 Oral mucositis (ulcerative), unspecified: Secondary | ICD-10-CM

## 2012-02-09 DIAGNOSIS — K121 Other forms of stomatitis: Secondary | ICD-10-CM | POA: Insufficient documentation

## 2012-02-09 MED ORDER — VALACYCLOVIR HCL 1 G PO TABS: 1000.0000 mg | ORAL_TABLET | Freq: Two times a day (BID) | ORAL | Status: DC

## 2012-02-09 NOTE — Patient Instructions (Addendum)
Please call our office if your symptoms do not improve or gets worse.  

## 2012-02-09 NOTE — Assessment & Plan Note (Signed)
78 year old white female with several shallow ulcers on her tongue. Symptoms likely from HSV. Take valtrex 1 g twice daily for 7 days. Use viscous lidocaine 1 teaspoon swish and spit every 6 hours as needed.  Patient advised to call office if symptoms persist or worsen.

## 2012-02-09 NOTE — Progress Notes (Signed)
Subjective:    Patient ID: Carolyn Adams, female    DOB: Oct 25, 1933, 78 y.o.   MRN: [id number]  HPI  78 year old white female with history of hypertension and hyperlipidemia complains of shallow ulcers on her tongue. She noticed them for the last 5 days. Her tongue burns when she eats or drinks. She denies any associated fever or lymphadenopathy in her neck.   Review of Systems See HPI  Past Medical History  Diagnosis Date  . Hyperlipidemia   . Hypertension   . Vertigo   . Chronic sinus infection     "terrible" (12/21/2011)  . Anginal pain 12/21/2011  . Chronic headaches     "but not all the time" (12/21/2011)  . Osteoarthritis     "hands" (12/21/2011)    History   Social History  . Marital Status: Widowed    Spouse Name: N/A    Number of Children: N/A  . Years of Education: N/A   Occupational History  . 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 Smoking status: Never Smoker   . Smokeless tobacco: Never Used  . Alcohol Use: No  . Drug Use: No  . Sexually Active: No   Other Topics Concern  . Not on file   Social History Narrative  . No narrative on file    Past Surgical History  Procedure Date  . Abdominal hysterectomy 1970's    partial    Family History  Problem Relation Age of Onset  . Cancer Sister   . Coronary artery disease Brother     s/p 3 stents  . Heart disease Brother     s/p 3 stents  . Hypertension Other   . Cancer Other     lung  . Diabetes Neg Hx   . Heart attack Son     Allergies  Allergen Reactions  . Ampicillin Other (See Comments)    REACTION: Chest Pain  . Hydrocodone-Acetaminophen Nausea And Vomiting  . Olmesartan Medoxomil Nausea Only    REACTION: stomach upse    Current Outpatient Prescriptions on File Prior to Visit  Medication Sig Dispense Refill  . Ascorbic Acid (VITAMIN C) 500 MG tablet Take 500 mg by mouth daily.        Marland Kitchen aspirin 81 MG EC tablet Take 81 mg by mouth daily.        .  citalopram (CELEXA) 10 MG tablet Take 1 tablet (10 mg total) by mouth daily.  90 tablet  1  . estropipate (OGEN) 0.75 MG tablet Take 0.375 mg by mouth daily.       . mometasone (NASONEX) 50 MCG/ACT nasal spray Use 2 sprays into each nostril once daily  17 g  3  . nebivolol (BYSTOLIC) 5 MG tablet Take 5 mg by mouth daily.      . pravastatin (PRAVACHOL) 40 MG tablet Take 1 tablet (40 mg total) by mouth daily.  90 tablet  1  . vitamin B-12 (CYANOCOBALAMIN) 100 MCG tablet Take 50 mcg by mouth daily.          BP 132/74  Temp 97.7 F (36.5 C) (Oral)  Wt 182 lb (82.555 kg)       Objective:   Physical Exam  Constitutional: She appears well-developed and well-nourished. No distress.  HENT:  Head: Normocephalic and atraumatic.       4 to 5 small shallow (2 mm) ulcers on tongue  Cardiovascular: Normal rate, regular rhythm and normal heart sounds.  Pulmonary/Chest: Effort normal and breath sounds normal. She has no wheezes.          Assessment & Plan:

## 2012-03-20 ENCOUNTER — Telehealth: Payer: Self-pay | Admitting: Internal Medicine

## 2012-03-20 NOTE — Telephone Encounter (Signed)
Patient Information:  Caller Name: Justice  Phone: [phone]  Patient: Carolyn Adams, Carolyn Adams  Gender: Female  DOB: [birth year]  Age: 78 Years  PCP: Artist Pais Doe-Hyun Molly Maduro) (Adults only)  Office Follow Up:  Does the office need to follow up with this patient?: Yes  Instructions For The Office: Please call pt and advise   Symptoms  Reason For Call & Symptoms: Pt is calling with sinus pain/congestion. Pt is very nasally congested. She cannot breathe through her nose.  Reviewed Health History In EMR: Yes  Reviewed Medications In EMR: Yes  Reviewed Allergies In EMR: Yes  Reviewed Surgeries / Procedures: Yes  Date of Onset of Symptoms: 03/18/2012  Treatments Tried: saline nasal sprays/ Claritin  Treatments Tried Worked: No  Guideline(s) Used:  Sinus Pain and Congestion  Disposition Per Guideline:   See Today or Tomorrow in Office  Reason For Disposition Reached:   Using nasal washes and pain medicine > 24 hours and sinus pain (lower forehead, cheekbone, or eye) persists  Advice Given:  N/A  Patient Refused Recommendation:  Patient Requests Prescription  Pt would like a prescription called in for nasal congestion.

## 2012-03-20 NOTE — Telephone Encounter (Addendum)
Pt can not come in due to transportation and  Will call back if necessary

## 2012-03-20 NOTE — Telephone Encounter (Signed)
Called pt no answer no machine.

## 2012-03-20 NOTE — Telephone Encounter (Signed)
See if we can add pt today 4 pm

## 2012-03-22 ENCOUNTER — Ambulatory Visit (INDEPENDENT_AMBULATORY_CARE_PROVIDER_SITE_OTHER): Payer: Medicare Other | Admitting: Internal Medicine

## 2012-03-22 ENCOUNTER — Encounter: Payer: Self-pay | Admitting: Internal Medicine

## 2012-03-22 VITALS — BP 142/84 | Temp 97.7°F | Wt 188.0 lb

## 2012-03-22 DIAGNOSIS — J329 Chronic sinusitis, unspecified: Secondary | ICD-10-CM | POA: Insufficient documentation

## 2012-03-22 DIAGNOSIS — J069 Acute upper respiratory infection, unspecified: Secondary | ICD-10-CM

## 2012-03-22 MED ORDER — IPRATROPIUM BROMIDE 0.06 % NA SOLN: 2.0000 | Freq: Four times a day (QID) | NASAL | Status: DC | PRN

## 2012-03-22 NOTE — Patient Instructions (Addendum)
Continue to use nasal saline spray Please call our office if your symptoms do not improve or gets worse.

## 2012-03-22 NOTE — Assessment & Plan Note (Signed)
79 year old white female with signs and symptoms of viral upper respiratory infection. Her main complaint is nasal congestion. Use ipratropium 0.06% spray 4 times a day as needed.  Patient advised to call office if symptoms persist or worsen.

## 2012-03-22 NOTE — Progress Notes (Signed)
Subjective:    Patient ID: Carolyn Adams, female    DOB: 1933/05/17, 79 y.o.   MRN: [id number]  HPI  79 year old white female complains of nasal congestion for last 6 days. She denies any associated cough or shortness of breath. She also denies fever but has had mild chills. She has been using intranasal saline spray. It has been helping some. She notes mild bilateral sinus pain.  Review of Systems Denies purulent nasal discharge, negative for ear pain  Past Medical History  Diagnosis Date  . Hyperlipidemia   . Hypertension   . Vertigo   . Chronic sinus infection     "terrible" (12/21/2011)  . Anginal pain 12/21/2011  . Chronic headaches     "but not all the time" (12/21/2011)  . Osteoarthritis     "hands" (12/21/2011)    History   Social History  . Marital Status: Widowed    Spouse Name: N/A    Number of Children: N/A  . Years of Education: N/A   Occupational History  . 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 Smoking status: Never Smoker   . Smokeless tobacco: Never Used  . Alcohol Use: No  . Drug Use: No  . Sexually Active: No   Other Topics Concern  . Not on file   Social History Narrative  . No narrative on file    Past Surgical History  Procedure Date  . Abdominal hysterectomy 1970's    partial    Family History  Problem Relation Age of Onset  . Cancer Sister   . Coronary artery disease Brother     s/p 3 stents  . Heart disease Brother     s/p 3 stents  . Hypertension Other   . Cancer Other     lung  . Diabetes Neg Hx   . Heart attack Son     Allergies  Allergen Reactions  . Ampicillin Other (See Comments)    REACTION: Chest Pain  . Hydrocodone-Acetaminophen Nausea And Vomiting  . Olmesartan Medoxomil Nausea Only    REACTION: stomach upse    Current Outpatient Prescriptions on File Prior to Visit  Medication Sig Dispense Refill  . Ascorbic Acid (VITAMIN C) 500 MG tablet Take 500 mg by mouth daily.          Marland Kitchen aspirin 81 MG EC tablet Take 81 mg by mouth daily.        . citalopram (CELEXA) 10 MG tablet Take 1 tablet (10 mg total) by mouth daily.  90 tablet  1  . estropipate (OGEN) 0.75 MG tablet Take 0.375 mg by mouth daily.       . Lidocaine HCl 2 % SOLN Swish and spit 5 ml 4 times daily as needed  120 mL  1  . mometasone (NASONEX) 50 MCG/ACT nasal spray Use 2 sprays into each nostril once daily  17 g  3  . nebivolol (BYSTOLIC) 5 MG tablet Take 5 mg by mouth daily.      . pravastatin (PRAVACHOL) 40 MG tablet Take 1 tablet (40 mg total) by mouth daily.  90 tablet  1  . valACYclovir (VALTREX) 1000 MG tablet Take 1 tablet (1,000 mg total) by mouth 2 (two) times daily.  14 tablet  0  . vitamin B-12 (CYANOCOBALAMIN) 100 MCG tablet Take 50 mcg by mouth daily.        Marland Kitchen ipratropium (ATROVENT) 0.06 % nasal spray Place 2 sprays into the nose 4 (four)  times daily as needed for rhinitis.  15 mL  2    BP 142/84  Temp 97.7 F (36.5 C) (Oral)  Wt 188 lb (85.276 kg)       Objective:   Physical Exam  Constitutional: She appears well-developed and well-nourished.  HENT:  Head: Normocephalic and atraumatic.  Right Ear: External ear normal.  Left Ear: External ear normal.  Mouth/Throat: No oropharyngeal exudate.       Oropharyngeal erythema  Cardiovascular: Normal rate, regular rhythm and normal heart sounds.   Pulmonary/Chest: Effort normal and breath sounds normal. She has no wheezes.  Psychiatric: She has a normal mood and affect. Her behavior is normal.          Assessment & Plan:

## 2012-03-27 ENCOUNTER — Ambulatory Visit (INDEPENDENT_AMBULATORY_CARE_PROVIDER_SITE_OTHER): Payer: Medicare Other | Admitting: Family

## 2012-03-27 ENCOUNTER — Encounter: Payer: Self-pay | Admitting: Family

## 2012-03-27 VITALS — BP 124/74 | HR 67 | Temp 98.3°F | Resp 16 | Wt 186.0 lb

## 2012-03-27 DIAGNOSIS — J329 Chronic sinusitis, unspecified: Secondary | ICD-10-CM

## 2012-03-27 MED ORDER — CEFUROXIME AXETIL 500 MG PO TABS: 500.0000 mg | ORAL_TABLET | Freq: Two times a day (BID) | ORAL | Status: DC

## 2012-03-27 MED ORDER — FLUTICASONE PROPIONATE 50 MCG/ACT NA SUSP: 2.0000 | Freq: Every day | NASAL | Status: DC

## 2012-03-27 NOTE — Assessment & Plan Note (Signed)
Now with sinusitis.  Will rx with ceftin (has tolerated keflex in past).  Add flonase to help with congestion- she continues mucinex.  Recommended coricidin prn as well. Pt instructed to call if symptoms worsen or if no improvement in 2-3 days.

## 2012-03-27 NOTE — Progress Notes (Signed)
Subjective:    Patient ID: Carolyn Adams, female    DOB: [birth year], 79 y.o.   MRN: [id number]  HPI  Carolyn Adams is a 79 yr old female who presents today with chief complaint of nasal congestion. Reports symptoms >10 days. She was seen by Dr. Artist Pais on 1/5 for viral URI and was prescribed ipratropium nasal spray.  Spray is not helping.  + maxillary sinus pressure.  She reports difficulty sleeping at night.  She denies fever, ear pain or sore throat.  She denies known sick contacts.     Review of Systems See HPI  Past Medical History  Diagnosis Date  . Hyperlipidemia   . Hypertension   . Vertigo   . Chronic sinus infection     "terrible" (12/21/2011)  . Anginal pain 12/21/2011  . Chronic headaches     "but not all the time" (12/21/2011)  . Osteoarthritis     "hands" (12/21/2011)    History   Social History  . Marital Status: Widowed    Spouse Name: N/A    Number of Children: N/A  . Years of Education: N/A   Occupational History  . 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 Smoking status: Never Smoker   . Smokeless tobacco: Never Used  . Alcohol Use: No  . Drug Use: No  . Sexually Active: No   Other Topics Concern  . Not on file   Social History Narrative  . No narrative on file    Past Surgical History  Procedure Date  . Abdominal hysterectomy 1970's    partial    Family History  Problem Relation Age of Onset  . Cancer Sister   . Coronary artery disease Brother     s/p 3 stents  . Heart disease Brother     s/p 3 stents  . Hypertension Other   . Cancer Other     lung  . Diabetes Neg Hx   . Heart attack Son     Allergies  Allergen Reactions  . Ampicillin Other (See Comments)    REACTION: Chest Pain  . Hydrocodone-Acetaminophen Nausea And Vomiting  . Olmesartan Medoxomil Nausea Only    REACTION: stomach upse    Current Outpatient Prescriptions on File Prior to Visit  Medication Sig Dispense Refill  . Ascorbic Acid  (VITAMIN C) 500 MG tablet Take 500 mg by mouth daily.        Marland Kitchen aspirin 81 MG EC tablet Take 81 mg by mouth daily.        . citalopram (CELEXA) 10 MG tablet Take 1 tablet (10 mg total) by mouth daily.  90 tablet  1  . estropipate (OGEN) 0.75 MG tablet Take 0.375 mg by mouth daily.       . nebivolol (BYSTOLIC) 5 MG tablet Take 5 mg by mouth daily.      . pravastatin (PRAVACHOL) 40 MG tablet Take 1 tablet (40 mg total) by mouth daily.  90 tablet  1  . vitamin B-12 (CYANOCOBALAMIN) 100 MCG tablet Take 50 mcg by mouth daily.        Marland Kitchen ipratropium (ATROVENT) 0.06 % nasal spray Place 2 sprays into the nose 4 (four) times daily as needed for rhinitis.  15 mL  2    There were no vitals taken for this visit.       Objective:   Physical Exam  Constitutional: She appears well-developed and well-nourished. No distress.  HENT:  Head: Normocephalic and  atraumatic.  Right Ear: Tympanic membrane and ear canal normal.  Left Ear: Tympanic membrane and ear canal normal.  Mouth/Throat: No oropharyngeal exudate or posterior oropharyngeal edema.       + maxillary sinus tenderness to palpation  Cardiovascular: Normal rate and regular rhythm.   No murmur heard. Pulmonary/Chest: Effort normal and breath sounds normal. No respiratory distress. She has no wheezes. She has no rales. She exhibits no tenderness.          Assessment & Plan:

## 2012-03-27 NOTE — Patient Instructions (Addendum)

## 2012-05-06 ENCOUNTER — Encounter (HOSPITAL_COMMUNITY): Payer: Self-pay

## 2012-05-06 ENCOUNTER — Emergency Department (HOSPITAL_COMMUNITY)
Admission: EM | Admit: 2012-05-06 | Discharge: 2012-05-06 | Disposition: A | Discharge status: Home / Self Care | Payer: Medicare Other | Attending: Emergency Medicine | Admitting: Emergency Medicine

## 2012-05-06 DIAGNOSIS — Z8679 Personal history of other diseases of the circulatory system: Secondary | ICD-10-CM | POA: Insufficient documentation

## 2012-05-06 DIAGNOSIS — Z8739 Personal history of other diseases of the musculoskeletal system and connective tissue: Secondary | ICD-10-CM | POA: Insufficient documentation

## 2012-05-06 DIAGNOSIS — R0981 Nasal congestion: Secondary | ICD-10-CM

## 2012-05-06 DIAGNOSIS — J3489 Other specified disorders of nose and nasal sinuses: Secondary | ICD-10-CM | POA: Insufficient documentation

## 2012-05-06 DIAGNOSIS — IMO0002 Reserved for concepts with insufficient information to code with codable children: Secondary | ICD-10-CM | POA: Insufficient documentation

## 2012-05-06 DIAGNOSIS — R059 Cough, unspecified: Secondary | ICD-10-CM | POA: Insufficient documentation

## 2012-05-06 DIAGNOSIS — Z8619 Personal history of other infectious and parasitic diseases: Secondary | ICD-10-CM | POA: Insufficient documentation

## 2012-05-06 DIAGNOSIS — E785 Hyperlipidemia, unspecified: Secondary | ICD-10-CM | POA: Insufficient documentation

## 2012-05-06 DIAGNOSIS — Z79899 Other long term (current) drug therapy: Secondary | ICD-10-CM | POA: Insufficient documentation

## 2012-05-06 DIAGNOSIS — I10 Essential (primary) hypertension: Secondary | ICD-10-CM | POA: Insufficient documentation

## 2012-05-06 DIAGNOSIS — R05 Cough: Secondary | ICD-10-CM | POA: Insufficient documentation

## 2012-05-06 DIAGNOSIS — Z7982 Long term (current) use of aspirin: Secondary | ICD-10-CM | POA: Insufficient documentation

## 2012-05-06 MED ORDER — OXYMETAZOLINE HCL 0.05 % NA SOLN: 2.0000 | Freq: Two times a day (BID) | NASAL | Status: DC

## 2012-05-06 MED ORDER — CETIRIZINE HCL 10 MG PO CAPS: 10.0000 mg | ORAL_CAPSULE | Freq: Every day | ORAL | Status: DC

## 2012-05-06 NOTE — ED Provider Notes (Signed)
History  This chart was scribed for Rhea Bleacher,  non-physician practitioner working with Dione Booze, MD by Bennett Scrape, ED Scribe. This patient was seen in room TR11C/TR11C and the patient's care was started at 8:25 PM.  CSN: [id number]  Arrival date & time 05/06/12  1925   First MD Initiated Contact with Patient 05/06/12 2025      Chief Complaint  Patient presents with  . Nasal Congestion    The history is provided by the patient. No language interpreter was used.    Carolyn Adams is a 79 y.o. female who presents to the Emergency Department complaining of 2 months of gradual onset, non-changing, constant nasal congestion with associated mild non-productive cough. She denies having any nasal discharge. She reports that she has been using Atrovent spray and Flonase spray prescribed by an ENT with [facility] and states that she has followed up 3 times with no improvement. She also reports that she was prescribed antibiotics but is uncertain what the name was. She denies trying Afrin for her symptoms. She denies otalgia, sore throat and fevers as associated symptoms. She has a h/o HTN and denies having problems with her BP since the onset. She also has a h/o HLD, vertigo and OA and takes daily medications daily. She denies smoking and alcohol use.  Past Medical History  Diagnosis Date  . Hyperlipidemia   . Hypertension   . Vertigo   . Chronic sinus infection     "terrible" (12/21/2011)  . Anginal pain 12/21/2011  . Chronic headaches     "but not all the time" (12/21/2011)  . Osteoarthritis     "hands" (12/21/2011)    Past Surgical History  Procedure Laterality Date  . Abdominal hysterectomy  1970's    partial    Family History  Problem Relation Age of Onset  . Cancer Sister   . Coronary artery disease Brother     s/p 3 stents  . Heart disease Brother     s/p 3 stents  . Hypertension Other   . Cancer Other     lung  . Diabetes Neg Hx   . Heart attack Son      History  Substance Use Topics  . Smoking status: Never Smoker   . Smokeless tobacco: Never Used  . Alcohol Use: No    No OB history provided.  Review of Systems  Constitutional: Negative for fever.  HENT: Positive for congestion. Negative for ear pain, sore throat and rhinorrhea.   Respiratory: Positive for cough. Negative for shortness of breath.   All other systems reviewed and are negative.    Allergies  Ampicillin; Hydrocodone-acetaminophen; and Olmesartan medoxomil  Home Medications   Current Outpatient Rx  Name  Route  Sig  Dispense  Refill  . Ascorbic Acid (VITAMIN C) 500 MG tablet   Oral   Take 500 mg by mouth daily.           Marland Kitchen aspirin 81 MG EC tablet   Oral   Take 81 mg by mouth daily.           . cefUROXime (CEFTIN) 500 MG tablet   Oral   Take 1 tablet (500 mg total) by mouth 2 (two) times daily.   20 tablet   0   . citalopram (CELEXA) 10 MG tablet   Oral   Take 1 tablet (10 mg total) by mouth daily.   90 tablet   1   . estropipate (OGEN) 0.75 MG tablet  Oral   Take 0.375 mg by mouth daily.          . fluticasone (FLONASE) 50 MCG/ACT nasal spray   Nasal   Place 2 sprays into the nose daily.   16 g   0   . ipratropium (ATROVENT) 0.06 % nasal spray   Nasal   Place 2 sprays into the nose 4 (four) times daily as needed for rhinitis.   15 mL   2   . nebivolol (BYSTOLIC) 5 MG tablet   Oral   Take 5 mg by mouth daily.         . pravastatin (PRAVACHOL) 40 MG tablet   Oral   Take 1 tablet (40 mg total) by mouth daily.   90 tablet   1   . vitamin B-12 (CYANOCOBALAMIN) 100 MCG tablet   Oral   Take 50 mcg by mouth daily.             Triage Vitals: BP 181/73  Pulse 71  Temp(Src) 97.3 F (36.3 C) (Oral)  Resp 16  SpO2 98%  Physical Exam  Nursing note and vitals reviewed. Constitutional: She appears well-developed and well-nourished. No distress.  HENT:  Head: Normocephalic and atraumatic.  Mouth/Throat: Oropharynx  is clear and moist.  Moderate amount of nasal turbinate edema bilaterally, no maxillary or frontal sinus tenderness   Eyes: Conjunctivae and EOM are normal. Pupils are equal, round, and reactive to light.  Neck: Neck supple. No tracheal deviation present.  Cardiovascular: Normal rate and regular rhythm.   No murmur heard. Pulmonary/Chest: Effort normal and breath sounds normal. No respiratory distress.  Musculoskeletal: Normal range of motion.  Lymphadenopathy:    She has no cervical adenopathy.  Neurological: She is alert.  Skin: Skin is warm and dry.  Psychiatric: She has a normal mood and affect. Her behavior is normal.    ED Course  Procedures (including critical care time)  DIAGNOSTIC STUDIES: Oxygen Saturation is 98% on room air, normal by my interpretation.    COORDINATION OF CARE: 8:33 PM- Discussed discharge plan which includes Afrin and decongestant with continued use of saline spray with pt and pt agreed to plan. Also advised pt to follow up and pt agreed.  Labs Reviewed - No data to display No results found.   1. Nasal congestion    Vital signs reviewed and are as follows: Filed Vitals:   05/06/12 1952  BP: 181/73  Pulse: 71  Temp: 97.3 F (36.3 C)  Resp: 16   Patient to try decongestant and Afrin. Counseled on proper use of Afrin.   Patient was informed of high blood pressure reading today.  Patient was counseled about long-term health problems hypertension can cause and was urged to follow-up with a primary care doctor in the next week for a blood pressure recheck.  Patient verbalized understanding.    MDM  Patient with chronic nasal congestion -- not improved to this point with nasal steroids. Patient has not tried Afrin or Zyrtec so will give trial. Otherwise patient remains well. She will likely need continued ENT follow-up. No emergent conditions suspected.    I personally performed the services described in this documentation, which was scribed in  my presence. The recorded information has been reviewed and is accurate.       Renne Crigler, [place] 05/07/12 0001

## 2012-05-06 NOTE — ED Notes (Signed)
Patient presents with nasal congestion x 2 months. Was seen by PCP 3 weeks ago for the same. Has been using fluticasone and saline nasal spray with no relief. No cough, sore throat or headache.

## 2012-05-07 NOTE — ED Provider Notes (Signed)
Medical screening examination/treatment/procedure(s) were performed by non-physician practitioner and as supervising physician I was immediately available for consultation/collaboration.   Dione Booze, MD 05/07/12 [phone]

## 2012-05-15 ENCOUNTER — Ambulatory Visit: Payer: Self-pay | Admitting: Family Medicine

## 2012-05-15 ENCOUNTER — Ambulatory Visit (INDEPENDENT_AMBULATORY_CARE_PROVIDER_SITE_OTHER): Payer: Medicare Other | Admitting: Internal Medicine

## 2012-05-15 ENCOUNTER — Telehealth: Payer: Self-pay | Admitting: Internal Medicine

## 2012-05-15 ENCOUNTER — Encounter: Payer: Self-pay | Admitting: Internal Medicine

## 2012-05-15 VITALS — BP 170/94 | Temp 97.6°F | Wt 183.0 lb

## 2012-05-15 DIAGNOSIS — J329 Chronic sinusitis, unspecified: Secondary | ICD-10-CM

## 2012-05-15 DIAGNOSIS — M79672 Pain in left foot: Secondary | ICD-10-CM | POA: Insufficient documentation

## 2012-05-15 DIAGNOSIS — M79609 Pain in unspecified limb: Secondary | ICD-10-CM

## 2012-05-15 MED ORDER — PREDNISONE 10 MG PO TABS: ORAL_TABLET | ORAL | Status: DC

## 2012-05-15 MED ORDER — COLCHICINE 0.6 MG PO TABS: 0.6000 mg | ORAL_TABLET | Freq: Every day | ORAL | Status: DC

## 2012-05-15 NOTE — Telephone Encounter (Signed)
Patient Information:  Caller Name: Junius Roads  Phone: [phone]  Patient: Carolyn Adams, Carolyn Adams  Gender: Female  DOB: Sep 19, 1933  Age: 79 Years  PCP: Artist Pais Doe-Hyun Molly Maduro) (Adults only)  Office Follow Up:  Does the office need to follow up with this patient?: No  Instructions For The Office: N/A  RN Note:  States her cold symptoms are better; was to return to work 05/16/12 but has started with tingling and pain in the left foot beginning 05/14/12.  Per foot pain protocol, emergent symptoms denied; advised appt today in office.  Appt scheduled 05/15/12 1500 with Dr. Clent Ridges.  krs/can  Symptoms  Reason For Call & Symptoms: cold symptoms x 2 weeks; tingling and pain in left foot.  Reviewed Health History In EMR: Yes  Reviewed Medications In EMR: Yes  Reviewed Allergies In EMR: Yes  Reviewed Surgeries / Procedures: Yes  Date of Onset of Symptoms: 05/01/2012  Guideline(s) Used:  Foot Pain  Disposition Per Guideline:   See Today in Office  Reason For Disposition Reached:   Numbness in one foot (i.e., loss of sensation)  Advice Given:  N/A  Appointment Scheduled:  05/15/2012 15:00:00 Appointment Scheduled Provider:  Artist Pais, Doe-Hyun Molly Maduro) (Adults only)

## 2012-05-15 NOTE — Assessment & Plan Note (Signed)
79 year old white female presents burning sensation in left foot. On exam, she has redness of first metatarsal joint with significant tenderness. I suspect her symptoms are secondary to gout flare. Check CBCD and uric acid level. Treat with prednisone taper then start colchicine.  Reassess in 2 weeks.  Patient advised to call office if symptoms persist or worsen.

## 2012-05-15 NOTE — Progress Notes (Signed)
Subjective:    Patient ID: Carolyn Adams, female    DOB: 1934/01/18, 79 y.o.   MRN: [id number]  HPI  79 year old white female with history of hypertension complains of burning sensation of left foot. Symptoms affect top of left foot and also bottom. She denies any associated back injury or pain. She has noticed redness of her first metatarsal joint. Her pain is worse with weightbearing.  Review of Systems Negative for fever chills  Past Medical History  Diagnosis Date  . Hyperlipidemia   . Hypertension   . Vertigo   . Chronic sinus infection     "terrible" (12/21/2011)  . Anginal pain 12/21/2011  . Chronic headaches     "but not all the time" (12/21/2011)  . Osteoarthritis     "hands" (12/21/2011)    History   Social History  . Marital Status: Widowed    Spouse Name: N/A    Number of Children: N/A  . Years of Education: N/A   Occupational History  . 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 Smoking status: Never Smoker   . Smokeless tobacco: Never Used  . Alcohol Use: No  . Drug Use: No  . Sexually Active: No   Other Topics Concern  . Not on file   Social History Narrative  . No narrative on file    Past Surgical History  Procedure Laterality Date  . Abdominal hysterectomy  1970's    partial    Family History  Problem Relation Age of Onset  . Cancer Sister   . Coronary artery disease Brother     s/p 3 stents  . Heart disease Brother     s/p 3 stents  . Hypertension Other   . Cancer Other     lung  . Diabetes Neg Hx   . Heart attack Son     Allergies  Allergen Reactions  . Ampicillin Other (See Comments)    REACTION: Chest Pain  . Hydrocodone-Acetaminophen Nausea And Vomiting  . Olmesartan Medoxomil Nausea Only    REACTION: stomach upse    Current Outpatient Prescriptions on File Prior to Visit  Medication Sig Dispense Refill  . Ascorbic Acid (VITAMIN C) 500 MG tablet Take 500 mg by mouth daily.        Marland Kitchen  aspirin 81 MG EC tablet Take 81 mg by mouth daily.        . Cetirizine HCl 10 MG CAPS Take 1 capsule (10 mg total) by mouth daily.  14 capsule  0  . citalopram (CELEXA) 10 MG tablet Take 1 tablet (10 mg total) by mouth daily.  90 tablet  1  . estropipate (OGEN) 0.75 MG tablet Take 0.375 mg by mouth daily.       . fluticasone (FLONASE) 50 MCG/ACT nasal spray Place 2 sprays into the nose daily.  16 g  0  . ipratropium (ATROVENT) 0.06 % nasal spray Place 2 sprays into the nose 4 (four) times daily as needed for rhinitis.  15 mL  2  . nebivolol (BYSTOLIC) 5 MG tablet Take 5 mg by mouth daily.      Marland Kitchen oxymetazoline (AFRIN NASAL SPRAY) 0.05 % nasal spray Place 2 sprays into the nose 2 (two) times daily.  30 mL  0  . pravastatin (PRAVACHOL) 40 MG tablet Take 1 tablet (40 mg total) by mouth daily.  90 tablet  1  . vitamin B-12 (CYANOCOBALAMIN) 100 MCG tablet Take 50 mcg by mouth daily.  No current facility-administered medications on file prior to visit.    BP 170/94  Temp(Src) 97.6 F (36.4 C) (Oral)  Wt 183 lb (83.008 kg)  BMI 34.55 kg/m2       Objective:   Physical Exam  Constitutional: She is oriented to person, place, and time. She appears well-developed and well-nourished.  Cardiovascular: Normal rate, regular rhythm and normal heart sounds.   Pulmonary/Chest: Effort normal and breath sounds normal. She has no wheezes.  Abdominal: Soft. Bowel sounds are normal. There is no tenderness.  Neurological: She is alert and oriented to person, place, and time. No cranial nerve deficit.  Skin:  Mild redness of first metatarsal joint of left foot, left foot tenderness          Assessment & Plan:

## 2012-05-15 NOTE — Assessment & Plan Note (Signed)
Patient having persistent nasal congestion. Patient reports seeing ENT. She is using "nose strips".  Follow up with ENT as directed.

## 2012-05-15 NOTE — Patient Instructions (Addendum)
Please contact our office if your symptoms do not improve or gets worse.  

## 2012-05-15 NOTE — Telephone Encounter (Signed)
This appt was scheduled with Dr. Clent Ridges.  For your review.

## 2012-05-16 ENCOUNTER — Ambulatory Visit: Payer: Self-pay | Admitting: Internal Medicine

## 2012-05-29 ENCOUNTER — Other Ambulatory Visit: Payer: Self-pay | Admitting: Internal Medicine

## 2012-05-29 ENCOUNTER — Ambulatory Visit (INDEPENDENT_AMBULATORY_CARE_PROVIDER_SITE_OTHER): Payer: Medicare Other | Admitting: Internal Medicine

## 2012-05-29 ENCOUNTER — Encounter: Payer: Self-pay | Admitting: Internal Medicine

## 2012-05-29 VITALS — BP 132/80 | Temp 97.9°F | Wt 182.0 lb

## 2012-05-29 DIAGNOSIS — J310 Chronic rhinitis: Secondary | ICD-10-CM

## 2012-05-29 DIAGNOSIS — M79609 Pain in unspecified limb: Secondary | ICD-10-CM

## 2012-05-29 DIAGNOSIS — M79672 Pain in left foot: Secondary | ICD-10-CM

## 2012-05-29 DIAGNOSIS — M109 Gout, unspecified: Secondary | ICD-10-CM

## 2012-05-29 LAB — BASIC METABOLIC PANEL
BUN: 19 mg/dL (ref 6–23)
CO2: 27 mEq/L (ref 19–32)
Calcium: 9.2 mg/dL (ref 8.4–10.5)
Chloride: 104 mEq/L (ref 96–112)
Creatinine, Ser: 1.2 mg/dL (ref 0.4–1.2)
GFR: 44.65 mL/min — ABNORMAL LOW (ref >60.00)
Glucose, Bld: 87 mg/dL (ref 70–99)
Potassium: 4.4 mEq/L (ref 3.5–5.1)
Sodium: 140 mEq/L (ref 135–145)

## 2012-05-29 LAB — CBC WITH DIFFERENTIAL/PLATELET
Basophils Absolute: 0 10*3/uL (ref 0.0–0.1)
Basophils Relative: 0.4 % (ref 0.0–3.0)
Eosinophils Absolute: 0.4 10*3/uL (ref 0.0–0.7)
Eosinophils Relative: 3.2 % (ref 0.0–5.0)
HCT: 39.6 % (ref 36.0–46.0)
Hemoglobin: 13.5 g/dL (ref 12.0–15.0)
Lymphocytes Relative: 20.7 % (ref 12.0–46.0)
Lymphs Abs: 2.3 10*3/uL (ref 0.7–4.0)
MCHC: 34.1 g/dL (ref 30.0–36.0)
MCV: 90.8 fl (ref 78.0–100.0)
Monocytes Absolute: 0.8 10*3/uL (ref 0.1–1.0)
Monocytes Relative: 6.8 % (ref 3.0–12.0)
Neutro Abs: 7.7 10*3/uL (ref 1.4–7.7)
Neutrophils Relative %: 68.9 % (ref 43.0–77.0)
Platelets: 274 10*3/uL (ref 150.0–400.0)
RBC: 4.36 Mil/uL (ref 3.87–5.11)
RDW: 14.2 % (ref 11.5–14.6)
WBC: 11.1 10*3/uL — ABNORMAL HIGH (ref 4.5–10.5)

## 2012-05-29 LAB — URIC ACID: Uric Acid, Serum: 8.5 mg/dL — ABNORMAL HIGH (ref 2.4–7.0)

## 2012-05-29 MED ORDER — AZELASTINE HCL 0.1 % NA SOLN: 2.0000 | Freq: Two times a day (BID) | NASAL | Status: DC

## 2012-05-29 NOTE — Progress Notes (Signed)
Subjective:    Patient ID: Carolyn Adams, female    DOB: 23-Oct-1933, 79 y.o.   MRN: [id number]  HPI  79 year old white female for followup regarding left foot pain.  Patient symptoms presumed secondary to possible gout flare. She was started on prednisone and colchicine. Patient reports left foot pain has significantly improved. Patient did not have uric acid level drawn as directed.  Patient reports persistent issues with nasal congestion. She was seen by ENT. She's been using several intranasal medications without any improvement. She is currently using Flonase once daily.  Review of Systems Negative for foot pain or redness  Past Medical History  Diagnosis Date  . Hyperlipidemia   . Hypertension   . Vertigo   . Chronic sinus infection     "terrible" (12/21/2011)  . Anginal pain 12/21/2011  . Chronic headaches     "but not all the time" (12/21/2011)  . Osteoarthritis     "hands" (12/21/2011)    History   Social History  . Marital Status: Widowed    Spouse Name: N/A    Number of Children: N/A  . Years of Education: N/A   Occupational History  . 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 Smoking status: Never Smoker   . Smokeless tobacco: Never Used  . Alcohol Use: No  . Drug Use: No  . Sexually Active: No   Other Topics Concern  . Not on file   Social History Narrative  . No narrative on file    Past Surgical History  Procedure Laterality Date  . Abdominal hysterectomy  1970's    partial    Family History  Problem Relation Age of Onset  . Cancer Sister   . Coronary artery disease Brother     s/p 3 stents  . Heart disease Brother     s/p 3 stents  . Hypertension Other   . Cancer Other     lung  . Diabetes Neg Hx   . Heart attack Son     Allergies  Allergen Reactions  . Ampicillin Other (See Comments)    REACTION: Chest Pain  . Hydrocodone-Acetaminophen Nausea And Vomiting  . Olmesartan Medoxomil Nausea Only   REACTION: stomach upse    Current Outpatient Prescriptions on File Prior to Visit  Medication Sig Dispense Refill  . aspirin 81 MG EC tablet Take 81 mg by mouth daily.        . Cetirizine HCl 10 MG CAPS Take 1 capsule (10 mg total) by mouth daily.  14 capsule  0  . citalopram (CELEXA) 10 MG tablet Take 1 tablet (10 mg total) by mouth daily.  90 tablet  1  . colchicine 0.6 MG tablet Take 1 tablet (0.6 mg total) by mouth daily.  30 tablet  2  . estropipate (OGEN) 0.75 MG tablet Take 0.375 mg by mouth daily.       . fluticasone (FLONASE) 50 MCG/ACT nasal spray Place 2 sprays into the nose daily.  16 g  0  . ipratropium (ATROVENT) 0.06 % nasal spray Place 2 sprays into the nose 4 (four) times daily as needed for rhinitis.  15 mL  2  . nebivolol (BYSTOLIC) 5 MG tablet Take 5 mg by mouth daily.      . pravastatin (PRAVACHOL) 40 MG tablet Take 1 tablet (40 mg total) by mouth daily.  90 tablet  1  . vitamin B-12 (CYANOCOBALAMIN) 100 MCG tablet Take 50 mcg by mouth daily.  No current facility-administered medications on file prior to visit.    BP 132/80  Temp(Src) 97.9 F (36.6 C) (Oral)  Wt 182 lb (82.555 kg)  BMI 34.36 kg/m2       Objective:   Physical Exam  Constitutional: She appears well-developed and well-nourished.  HENT:  Head: Normocephalic and atraumatic.  Right Ear: External ear normal.  Left Ear: External ear normal.  Mouth/Throat: Oropharynx is clear and moist.  Cardiovascular: Normal rate, regular rhythm and normal heart sounds.   Pulmonary/Chest: Effort normal and breath sounds normal. She has no wheezes.  Musculoskeletal:  Left foot redness and tenderness resolved.          Assessment & Plan:

## 2012-05-29 NOTE — Assessment & Plan Note (Addendum)
Patient complains of chronic rhinitis (Multifactorial). She is followed by ENT. Minimal improvement with Flonase. Add Astelin.  She discontinued OTC afrin use.

## 2012-05-29 NOTE — Assessment & Plan Note (Signed)
Patient's left foot pain improved with prednisone tapering colchicine. She likely had gout flare. Proceed with checking uric acid level. We discussed possibly starting allopurinol if she has elevated uric acid levels.

## 2012-05-30 ENCOUNTER — Telehealth: Payer: Self-pay | Admitting: *Deleted

## 2012-05-30 NOTE — Telephone Encounter (Signed)
Pt needs a work to give employer.  When is she ok to return to work?

## 2012-05-30 NOTE — Telephone Encounter (Signed)
She is ok to return to work as of her last OV

## 2012-05-30 NOTE — Telephone Encounter (Signed)
Note up front for p/u, pt aware

## 2012-05-31 ENCOUNTER — Other Ambulatory Visit: Payer: Self-pay | Admitting: *Deleted

## 2012-05-31 MED ORDER — ALLOPURINOL 100 MG PO TABS: ORAL_TABLET | ORAL | Status: DC

## 2012-06-06 ENCOUNTER — Other Ambulatory Visit: Payer: Self-pay | Admitting: Internal Medicine

## 2012-07-18 ENCOUNTER — Telehealth: Payer: Self-pay | Admitting: Internal Medicine

## 2012-07-18 DIAGNOSIS — Z0000 Encounter for general adult medical examination without abnormal findings: Secondary | ICD-10-CM

## 2012-07-18 NOTE — Telephone Encounter (Signed)
PT called to request a Colonoscopy. Please assist.

## 2012-07-18 NOTE — Telephone Encounter (Signed)
Referral order placed.

## 2012-07-18 NOTE — Telephone Encounter (Signed)
Arrange referral to GI for screening colonoscopy

## 2012-08-20 ENCOUNTER — Ambulatory Visit (AMBULATORY_SURGERY_CENTER): Payer: Medicare Other | Admitting: *Deleted

## 2012-08-20 VITALS — Ht 63.0 in | Wt 181.2 lb

## 2012-08-20 DIAGNOSIS — Z1211 Encounter for screening for malignant neoplasm of colon: Secondary | ICD-10-CM

## 2012-08-20 MED ORDER — MOVIPREP 100 G PO SOLR: ORAL | Status: DC

## 2012-09-03 ENCOUNTER — Encounter: Payer: Self-pay | Admitting: Family

## 2012-09-03 ENCOUNTER — Encounter: Payer: Self-pay | Admitting: Gastroenterology

## 2012-09-03 ENCOUNTER — Ambulatory Visit (INDEPENDENT_AMBULATORY_CARE_PROVIDER_SITE_OTHER): Payer: Medicare Other | Admitting: Family

## 2012-09-03 ENCOUNTER — Ambulatory Visit (AMBULATORY_SURGERY_CENTER): Payer: Medicare Other | Admitting: Gastroenterology

## 2012-09-03 VITALS — BP 118/69 | HR 60 | Temp 96.6°F | Resp 20 | Ht 63.0 in | Wt 181.0 lb

## 2012-09-03 VITALS — BP 124/78 | HR 73 | Wt 183.0 lb

## 2012-09-03 DIAGNOSIS — R0981 Nasal congestion: Secondary | ICD-10-CM

## 2012-09-03 DIAGNOSIS — I10 Essential (primary) hypertension: Secondary | ICD-10-CM

## 2012-09-03 DIAGNOSIS — Z1211 Encounter for screening for malignant neoplasm of colon: Secondary | ICD-10-CM

## 2012-09-03 DIAGNOSIS — J3489 Other specified disorders of nose and nasal sinuses: Secondary | ICD-10-CM

## 2012-09-03 DIAGNOSIS — K5730 Diverticulosis of large intestine without perforation or abscess without bleeding: Secondary | ICD-10-CM

## 2012-09-03 MED ORDER — METHYLPREDNISOLONE 4 MG PO KIT: PACK | ORAL | Status: AC

## 2012-09-03 MED ORDER — SODIUM CHLORIDE 0.9 % IV SOLN: 500.0000 mL | INTRAVENOUS | Status: DC

## 2012-09-03 NOTE — Progress Notes (Signed)
Subjective:    Patient ID: Carolyn Adams, female    DOB: Mar 04, 1934, 79 y.o.   MRN: [id number]  HPI 79 year old female, nonsmoker is in today with persistent nasal congestion. Reports have a nasal congestion chronically since about February. She has been taking Allegra-D and Zyrtec-D for which she's discontinued because she feels like it's causing a rash on her arms. Reports using an Afrin nasal spray approximately twice a day and Breathe Right strips daily over the last several months.   Review of Systems  Constitutional: Negative.   HENT: Positive for congestion. Negative for nosebleeds, rhinorrhea and sneezing.   Eyes: Negative.   Respiratory: Negative.   Cardiovascular: Negative.   Musculoskeletal: Negative.   Skin: Negative.   Allergic/Immunologic: Negative.   Hematological: Negative.   Psychiatric/Behavioral: Negative.    Past Medical History  Diagnosis Date  . Hyperlipidemia   . Hypertension   . Vertigo   . Chronic sinus infection     "terrible" (12/21/2011)  . Anginal pain 12/21/2011  . Chronic headaches     "but not all the time" (12/21/2011)  . Osteoarthritis     "hands" (12/21/2011)  . Allergy     History   Social History  . Marital Status: Widowed    Spouse Name: N/A    Number of Children: N/A  . Years of Education: N/A   Occupational History  . 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 Smoking status: Never Smoker   . Smokeless tobacco: Never Used  . Alcohol Use: No  . Drug Use: No  . Sexually Active: No   Other Topics Concern  . Not on file   Social History Narrative  . No narrative on file    Past Surgical History  Procedure Laterality Date  . Abdominal hysterectomy  1970's    partial    Family History  Problem Relation Age of Onset  . Cancer Sister   . Coronary artery disease Brother     s/p 3 stents  . Heart disease Brother     s/p 3 stents  . Hypertension Other   . Cancer Other     lung  . Diabetes  Neg Hx   . Colon cancer Neg Hx   . Heart attack Son     Allergies  Allergen Reactions  . Ampicillin Other (See Comments)    REACTION: Chest Pain  . Hydrocodone-Acetaminophen Nausea And Vomiting  . Olmesartan Medoxomil Nausea Only    REACTION: stomach upse    Current Outpatient Prescriptions on File Prior to Visit  Medication Sig Dispense Refill  . allopurinol (ZYLOPRIM) 100 MG tablet       . aspirin 81 MG EC tablet Take 81 mg by mouth daily.        Marland Kitchen azelastine (ASTELIN) 137 MCG/SPRAY nasal spray Place 2 sprays into the nose 2 (two) times daily. Use in each nostril as directed  30 mL  5  . BYSTOLIC 5 MG tablet take 1 tablet by mouth once daily  30 tablet  3  . cetirizine-pseudoephedrine (ZYRTEC-D) 5-120 MG per tablet Take 1 tablet by mouth daily.      . citalopram (CELEXA) 10 MG tablet Take 1 tablet (10 mg total) by mouth daily.  90 tablet  1  . pravastatin (PRAVACHOL) 20 MG tablet take 1 tablet by mouth once daily  90 tablet  0  . vitamin B-12 (CYANOCOBALAMIN) 100 MCG tablet Take 50 mcg by mouth daily.  Current Facility-Administered Medications on File Prior to Visit  Medication Dose Route Frequency Provider Last Rate Last Dose  . 0.9 %  sodium chloride infusion  500 mL Intravenous Continuous Mardella Layman, MD        BP 124/78  Pulse 73  Wt 183 lb (83.008 kg)  BMI 32.43 kg/m2  SpO2 98%chart    Objective:   Physical Exam  Constitutional: She is oriented to person, place, and time. She appears well-developed and well-nourished.  HENT:  Right Ear: External ear normal.  Left Ear: External ear normal.  Mouth/Throat: Oropharynx is clear and moist.  Nasal turbinates pale and edematous  Neck: Normal range of motion. Neck supple.  Cardiovascular: Normal rate, regular rhythm and normal heart sounds.   Pulmonary/Chest: Effort normal and breath sounds normal.  Neurological: She is alert and oriented to person, place, and time.  Skin: Skin is warm and dry.   Psychiatric: She has a normal mood and affect.          Assessment & Plan:  Assessment:  1. Nasal congestion 2. Hypertension  Plan: The underlying issue for her congestion appears to be the chronic use of Afrin. Aspirin works when she takes it but is very temporary. I've explained that Afrin is in for short-term use only, 3 days on, 3 days off. However, we will try to get her off the medication. Medrol Dosepak as directed. After 6 days, start Nasonex 2 sprays in each nostril once a day. One sample given today. Followup with PCP or myself in 2 weeks and sooner as needed.

## 2012-09-03 NOTE — Progress Notes (Signed)
Patient did not have preoperative order for IV antibiotic SSI prophylaxis. (G8918)  Patient did not experience any of the following events: a burn prior to discharge; a fall within the facility; wrong site/side/patient/procedure/implant event; or a hospital transfer or hospital admission upon discharge from the facility. (G8907)  

## 2012-09-03 NOTE — Patient Instructions (Addendum)
1. Do NOT use anymore Affrin.  2. Medrol pack as discussed.  3. After you complete Medrol, start nasal spray

## 2012-09-03 NOTE — Patient Instructions (Addendum)
YOU HAD AN ENDOSCOPIC PROCEDURE TODAY AT THE [facility]: Refer to the procedure report that was given to you for any specific questions about what was found during the examination.  If the procedure report does not answer your questions, please call your gastroenterologist to clarify.  If you requested that your care partner not be given the details of your procedure findings, then the procedure report has been included in a sealed envelope for you to review at your convenience later.  YOU SHOULD EXPECT: Some feelings of bloating in the abdomen. Passage of more gas than usual.  Walking can help get rid of the air that was put into your GI tract during the procedure and reduce the bloating. If you had a lower endoscopy (such as a colonoscopy or flexible sigmoidoscopy) you may notice spotting of blood in your stool or on the toilet paper. If you underwent a bowel prep for your procedure, then you may not have a normal bowel movement for a few days.  DIET: Your first meal following the procedure should be a light meal and then it is ok to progress to your normal diet.  A half-sandwich or bowl of soup is an example of a good first meal.  Heavy or fried foods are harder to digest and may make you feel nauseous or bloated.  Likewise meals heavy in dairy and vegetables can cause extra gas to form and this can also increase the bloating.  Drink plenty of fluids but you should avoid alcoholic beverages for 24 hours.  Try to increase the fiber in your diet to prevent Diverticulitis.  ACTIVITY: Your care partner should take you home directly after the procedure.  You should plan to take it easy, moving slowly for the rest of the day.  You can resume normal activity the day after the procedure however you should NOT DRIVE or use heavy machinery for 24 hours (because of the sedation medicines used during the test).    SYMPTOMS TO REPORT IMMEDIATELY: A gastroenterologist can be reached at any hour.  During  normal business hours, 8:30 AM to 5:00 PM Monday through Friday, call [phone].  After hours and on weekends, please call the GI answering service at [phone] who will take a message and have the physician on call contact you.   Following lower endoscopy (colonoscopy or flexible sigmoidoscopy):  Excessive amounts of blood in the stool  Significant tenderness or worsening of abdominal pains  Swelling of the abdomen that is new, acute  Fever of 100F or higher FOLLOW UP: If any biopsies were taken you will be contacted by phone or by letter within the next 1-3 weeks.  Call your gastroenterologist if you have not heard about the biopsies in 3 weeks.  Our staff will call the home number listed on your records the next business day following your procedure to check on you and address any questions or concerns that you may have at that time regarding the information given to you following your procedure. This is a courtesy call and so if there is no answer at the home number and we have not heard from you through the emergency physician on call, we will assume that you have returned to your regular daily activities without incident.  SIGNATURES/CONFIDENTIALITY: You and/or your care partner have signed paperwork which will be entered into your electronic medical record.  These signatures attest to the fact that that the information above on your After Visit Summary has been reviewed   and is understood.  Full responsibility of the confidentiality of this discharge information lies with you and/or your care-partner. 

## 2012-09-03 NOTE — Op Note (Signed)
[facility] [address] [place], [address]   COLONOSCOPY PROCEDURE REPORT  PATIENT: Carolyn Adams, Carolyn Adams  MR#: [id number] BIRTHDATE: 1933/07/11 , 78  yrs. old GENDER: Female ENDOSCOPIST: Mardella Layman, MD, [facility] REFERRED BY:  Thomos Lemons, DO PROCEDURE DATE:  09/03/2012 PROCEDURE:   Colonoscopy, surveillance ASA CLASS:   Class III INDICATIONS:Average risk patient for colon cancer. MEDICATIONS: propofol (Diprivan) 250mg  IV  DESCRIPTION OF PROCEDURE:   After the risks and benefits and of the procedure were explained, informed consent was obtained.  A digital rectal exam revealed no abnormalities of the rectum.    The [id number]  endoscope was introduced through the anus and advanced to the cecum, which was identified by both the appendix and ileocecal valve .  The quality of the prep was good, using MoviPrep .  The instrument was then slowly withdrawn as the colon was fully examined.     COLON FINDINGS: There was mild diverticulosis noted in the sigmoid colon and descending colon with associated muscular hypertrophy. A normal appearing cecum, ileocecal valve, and appendiceal orifice were identified.  The ascending, hepatic flexure, transverse, splenic flexure, descending, sigmoid colon and rectum appeared unremarkable.  No polyps or cancers were seen.     Retroflexed views revealed no abnormalities.     The scope was then withdrawn from the patient and the procedure completed.  COMPLICATIONS: There were no complications. ENDOSCOPIC IMPRESSION: 1.   There was mild diverticulosis noted in the sigmoid colon and descending colon 2.   Normal colon ..no polyps noted.  RECOMMENDATIONS: 1.  High fiber diet 2.  Given your age, you will not need another colonoscopy for colon cancer screening or polyp surveillance.  These types of tests usually stop around the age 78.   REPEAT EXAM:  cc:  _______________________________ eSignedMardella Layman,  MD, [facility] 09/03/2012 10:38 AM

## 2012-09-04 ENCOUNTER — Telehealth: Payer: Self-pay

## 2012-09-04 NOTE — Telephone Encounter (Signed)
  Follow up Call-  Call back number 09/03/2012  Post procedure Call Back phone  # [phone], [phone]  Permission to leave phone message No     Patient questions:  Do you have a fever, pain , or abdominal swelling? no Pain Score  0 *  Have you tolerated food without any problems? yes  Have you been able to return to your normal activities? yes  Do you have any questions about your discharge instructions: Diet   no Medications  no Follow up visit  no  Do you have questions or concerns about your Care? no  Actions: * If pain score is 4 or above: No action needed, pain <4.   No problems per the pt. Maw

## 2012-09-06 ENCOUNTER — Ambulatory Visit: Payer: Self-pay | Admitting: Family Medicine

## 2012-09-11 ENCOUNTER — Other Ambulatory Visit: Payer: Self-pay | Admitting: Internal Medicine

## 2012-09-17 ENCOUNTER — Ambulatory Visit: Payer: Medicare Other | Admitting: Internal Medicine

## 2012-09-18 ENCOUNTER — Encounter: Payer: Self-pay | Admitting: Internal Medicine

## 2012-09-18 ENCOUNTER — Ambulatory Visit (INDEPENDENT_AMBULATORY_CARE_PROVIDER_SITE_OTHER): Payer: Medicare Other | Admitting: Internal Medicine

## 2012-09-18 VITALS — BP 130/80 | HR 86 | Temp 97.8°F | Resp 20 | Wt 178.0 lb

## 2012-09-18 DIAGNOSIS — J310 Chronic rhinitis: Secondary | ICD-10-CM

## 2012-09-18 DIAGNOSIS — I10 Essential (primary) hypertension: Secondary | ICD-10-CM

## 2012-09-18 MED ORDER — MONTELUKAST SODIUM 10 MG PO TABS: 10.0000 mg | ORAL_TABLET | Freq: Every day | ORAL | Status: DC

## 2012-09-18 NOTE — Patient Instructions (Addendum)
Allergy consultation as discussed   Use saline irrigation, warm  moist compresses  as directed.  Call if there is no improvement in 5 to 7 days, or sooner if you develop increasing pain, fever, or any new symptoms.

## 2012-09-18 NOTE — Progress Notes (Signed)
Subjective:    Patient ID: Carolyn Adams, female    DOB: Feb 17, 1934, 79 y.o.   MRN: [id number]  HPI   79 year old patient who has treated hypertension. She presents today with a chief complaint of severe sinus congestion.  This has been a chronic problem and she has been evaluated by ENT. She describes congestion worse on the right side. It interferes with her sleep. At the present time she is on Astelin and has been on Flonase and antihistamines in the past. She has been treated with short-term prednisone for acute illness such as gout and she states there has been no benefit  Past Medical History  Diagnosis Date  . Hyperlipidemia   . Hypertension   . Vertigo   . Chronic sinus infection     "terrible" (12/21/2011)  . Anginal pain 12/21/2011  . Chronic headaches     "but not all the time" (12/21/2011)  . Osteoarthritis     "hands" (12/21/2011)  . Allergy     History   Social History  . Marital Status: Widowed    Spouse Name: N/A    Number of Children: N/A  . Years of Education: N/A   Occupational History  . 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 Smoking status: Never Smoker   . Smokeless tobacco: Never Used  . Alcohol Use: No  . Drug Use: No  . Sexually Active: No   Other Topics Concern  . Not on file   Social History Narrative  . No narrative on file    Past Surgical History  Procedure Laterality Date  . Abdominal hysterectomy  1970's    partial    Family History  Problem Relation Age of Onset  . Cancer Sister   . Coronary artery disease Brother     s/p 3 stents  . Heart disease Brother     s/p 3 stents  . Hypertension Other   . Cancer Other     lung  . Diabetes Neg Hx   . Colon cancer Neg Hx   . Heart attack Son     Allergies  Allergen Reactions  . Methylprednisolone Palpitations    Chest pain  . Ampicillin Other (See Comments)    REACTION: Chest Pain  . Hydrocodone-Acetaminophen Nausea And Vomiting  . Olmesartan  Medoxomil Nausea Only    REACTION: stomach upse    Current Outpatient Prescriptions on File Prior to Visit  Medication Sig Dispense Refill  . allopurinol (ZYLOPRIM) 100 MG tablet       . aspirin 81 MG EC tablet Take 81 mg by mouth daily.        Marland Kitchen azelastine (ASTELIN) 137 MCG/SPRAY nasal spray Place 2 sprays into the nose 2 (two) times daily. Use in each nostril as directed  30 mL  5  . BYSTOLIC 5 MG tablet take 1 tablet by mouth once daily  30 tablet  3  . citalopram (CELEXA) 10 MG tablet Take 1 tablet (10 mg total) by mouth daily.  90 tablet  1  . pravastatin (PRAVACHOL) 20 MG tablet take 1 tablet by mouth once daily  90 tablet  1  . vitamin B-12 (CYANOCOBALAMIN) 100 MCG tablet Take 50 mcg by mouth daily.         No current facility-administered medications on file prior to visit.    BP 130/80  Pulse 86  Temp(Src) 97.8 F (36.6 C) (Oral)  Resp 20  Wt 178 lb (80.74 kg)  BMI 31.54 kg/m2  SpO2 98%      Review of Systems  Constitutional: Negative.   HENT: Positive for congestion, rhinorrhea and postnasal drip. Negative for hearing loss, sore throat, dental problem, sinus pressure and tinnitus.   Eyes: Negative for pain, discharge and visual disturbance.  Respiratory: Negative for cough and shortness of breath.   Cardiovascular: Negative for chest pain, palpitations and leg swelling.  Gastrointestinal: Negative for nausea, vomiting, abdominal pain, diarrhea, constipation, blood in stool and abdominal distention.  Genitourinary: Negative for dysuria, urgency, frequency, hematuria, flank pain, vaginal bleeding, vaginal discharge, difficulty urinating, vaginal pain and pelvic pain.  Musculoskeletal: Negative for joint swelling, arthralgias and gait problem.  Skin: Negative for rash.  Neurological: Negative for dizziness, syncope, speech difficulty, weakness, numbness and headaches.  Hematological: Negative for adenopathy.  Psychiatric/Behavioral: Negative for behavioral problems,  dysphoric mood and agitation. The patient is not nervous/anxious.        Objective:   Physical Exam  Constitutional: She is oriented to person, place, and time. She appears well-developed and well-nourished.  HENT:  Head: Normocephalic.  Right Ear: External ear normal.  Left Ear: External ear normal.  Mouth/Throat: Oropharynx is clear and moist.  Nasal mucosa more edematous on the right  Eyes: Conjunctivae and EOM are normal. Pupils are equal, round, and reactive to light.  Neck: Normal range of motion. Neck supple. No thyromegaly present.  Cardiovascular: Normal rate, regular rhythm, normal heart sounds and intact distal pulses.   Pulmonary/Chest: Effort normal and breath sounds normal.  Abdominal: Soft. Bowel sounds are normal. She exhibits no mass. There is no tenderness.  Musculoskeletal: Normal range of motion.  Lymphadenopathy:    She has no cervical adenopathy.  Neurological: She is alert and oriented to person, place, and time.  Skin: Skin is warm and dry. No rash noted.  Psychiatric: She has a normal mood and affect. Her behavior is normal.          Assessment & Plan:   Chronic rhinitis.   Will give a trial of singulair and set up for Allergy consult  HTN-stable

## 2012-11-11 ENCOUNTER — Other Ambulatory Visit: Payer: Self-pay | Admitting: Internal Medicine

## 2012-12-16 ENCOUNTER — Other Ambulatory Visit: Payer: Self-pay | Admitting: Internal Medicine

## 2013-01-24 HISTORY — PX: NASAL SEPTUM SURGERY: SHX37

## 2013-03-07 ENCOUNTER — Encounter: Payer: Self-pay | Admitting: Physician Assistant

## 2013-03-07 ENCOUNTER — Ambulatory Visit (INDEPENDENT_AMBULATORY_CARE_PROVIDER_SITE_OTHER): Payer: Medicare Other | Admitting: Physician Assistant

## 2013-03-07 VITALS — BP 132/68 | HR 64 | Temp 97.7°F | Resp 16 | Ht 63.0 in | Wt 181.0 lb

## 2013-03-07 DIAGNOSIS — J040 Acute laryngitis: Secondary | ICD-10-CM

## 2013-03-07 NOTE — Patient Instructions (Signed)
Increase fluid intake.  Rest your voice.  Salt-water gargles.  Saline nasal spray.  Probiotic.  Claritin.  Place a humidifier in the bedroom.  Laryngitis At the top of your windpipe is your voice box. It is the source of your voice. Inside your voice box are 2 bands of muscles called vocal cords. When you breathe, your vocal cords are relaxed and open so that air can get into the lungs. When you decide to say something, these cords come together and vibrate. The sound from these vibrations goes into your throat and comes out through your mouth as sound. Laryngitis is an inflammation of the vocal cords that causes hoarseness, cough, loss of voice, sore throat, and dry throat. Laryngitis can be temporary (acute) or long-term (chronic). Most cases of acute laryngitis improve with time.Chronic laryngitis lasts for more than 3 weeks. CAUSES Laryngitis can often be related to excessive smoking, talking, or yelling, as well as inhalation of toxic fumes and allergies. Acute laryngitis is usually caused by a viral infection, vocal strain, measles or mumps, or bacterial infections. Chronic laryngitis is usually caused by vocal cord strain, vocal cord injury, postnasal drip, growths on the vocal cords, or acid reflux. SYMPTOMS   Cough.  Sore throat.  Dry throat. RISK FACTORS  Respiratory infections.  Exposure to irritating substances, such as cigarette smoke, excessive amounts of alcohol, stomach acids, and workplace chemicals.  Voice trauma, such as vocal cord injury from shouting or speaking too loud. DIAGNOSIS  Your cargiver will perform a physical exam. During the physical exam, your caregiver will examine your throat. The most common sign of laryngitis is hoarseness. Laryngoscopy may be necessary to confirm the diagnosis of this condition. This procedure allows your caregiver to look into the larynx. HOME CARE INSTRUCTIONS  Drink enough fluids to keep your urine clear or pale yellow.  Rest  until you no longer have symptoms or as directed by your caregiver.  Breathe in moist air.  Take all medicine as directed by your caregiver.  Do not smoke.  Talk as little as possible (this includes whispering).  Write on paper instead of talking until your voice is back to normal.  Follow up with your caregiver if your condition has not improved after 10 days. SEEK MEDICAL CARE IF:   You have trouble breathing.  You cough up blood.  You have persistent fever.  You have increasing pain.  You have difficulty swallowing. MAKE SURE YOU:  Understand these instructions.  Will watch your condition.  Will get help right away if you are not doing well or get worse. Document Released: 03/07/2005 Document Revised: 05/30/2011 Document Reviewed: 05/13/2010 [facility] Patient Information 2014 [place], [place].

## 2013-03-07 NOTE — Progress Notes (Signed)
Pre visit review using our clinic review tool, if applicable. No additional management support is needed unless otherwise documented below in the visit note/SLS  

## 2013-03-07 NOTE — Assessment & Plan Note (Signed)
Increase fluid intake. Rest voice. Saline nasal spray. Continue allergy medications. Humidifier in bedroom. Probiotic. Salt water gargles, Chloraseptic spray and Tylenol for throat pain. Return to clinic if symptoms not improving.

## 2013-03-07 NOTE — Progress Notes (Signed)
Patient ID: Carolyn Adams, female   DOB: 06-01-1933, 79 y.o.   MRN: [id number]  Patient presents to clinic today complaining of 1-2 days of sore throat and nonproductive cough. Patient also endorses hoarseness of voice. Patient denies fever, sinus pressure, sinus pain, ear pain, tooth pain, wheezing or shortness of breath. Patient denies recent travel. Denies history of asthma. Occasional seasonal allergies. Patient endorses sick contact.  Past Medical History  Diagnosis Date  . Hyperlipidemia   . Hypertension   . Vertigo   . Chronic sinus infection     "terrible" (12/21/2011)  . Anginal pain 12/21/2011  . Chronic headaches     "but not all the time" (12/21/2011)  . Osteoarthritis     "hands" (12/21/2011)  . Allergy     Current Outpatient Prescriptions on File Prior to Visit  Medication Sig Dispense Refill  . allopurinol (ZYLOPRIM) 100 MG tablet       . aspirin 81 MG EC tablet Take 81 mg by mouth daily.        Marland Kitchen azelastine (ASTELIN) 137 MCG/SPRAY nasal spray Place 2 sprays into the nose 2 (two) times daily. Use in each nostril as directed  30 mL  5  . BYSTOLIC 5 MG tablet take 1 tablet by mouth once daily  30 tablet  3  . citalopram (CELEXA) 10 MG tablet take 1 tablet by mouth once daily  90 tablet  1  . montelukast (SINGULAIR) 10 MG tablet Take 1 tablet (10 mg total) by mouth at bedtime.  30 tablet  3  . pravastatin (PRAVACHOL) 20 MG tablet take 1 tablet by mouth once daily  90 tablet  1  . vitamin B-12 (CYANOCOBALAMIN) 100 MCG tablet Take 50 mcg by mouth daily.         No current facility-administered medications on file prior to visit.    Allergies  Allergen Reactions  . Methylprednisolone Palpitations    Chest pain  . Ampicillin Other (See Comments)    REACTION: Chest Pain  . Hydrocodone-Acetaminophen Nausea And Vomiting  . Olmesartan Medoxomil Nausea Only    REACTION: stomach upse    Family History  Problem Relation Age of Onset  . Cancer Sister   . Coronary artery  disease Brother     s/p 3 stents  . Heart disease Brother     s/p 3 stents  . Hypertension Other   . Cancer Other     lung  . Diabetes Neg Hx   . Colon cancer Neg Hx   . Heart attack Son     History   Social History  . Marital Status: Widowed    Spouse Name: N/A    Number of Children: N/A  . Years of Education: N/A   Occupational History  . 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 Smoking status: Never Smoker   . Smokeless tobacco: Never Used  . Alcohol Use: No  . Drug Use: No  . Sexual Activity: No   Other Topics Concern  . None   Social History Narrative  . None    Review of Systems - see history of present illness. All other review of systems are negative.   Filed Vitals:   03/07/13 1413  BP: 132/68  Pulse: 64  Temp: 97.7 F (36.5 C)  Resp: 16   Physical Exam  Vitals reviewed. Constitutional: She is oriented to person, place, and time and well-developed, well-nourished, and in no distress.  HENT:  Head:  Normocephalic and atraumatic.  Right Ear: External ear normal.  Left Ear: External ear normal.  Nose: Nose normal.  Mouth/Throat: Oropharynx is clear and moist.  Tympanic membranes within normal limits bilaterally. No tenderness to percussion of sinuses noted on exam.  Eyes: Conjunctivae are normal. Pupils are equal, round, and reactive to light.  Neck: Neck supple.  Cardiovascular: Normal rate, regular rhythm, normal heart sounds and intact distal pulses.   Pulmonary/Chest: Effort normal and breath sounds normal. No respiratory distress. She has no wheezes. She has no rales. She exhibits no tenderness.  Lymphadenopathy:    She has no cervical adenopathy.  Neurological: She is alert and oriented to person, place, and time.  Skin: Skin is warm and dry. No rash noted.  Psychiatric: Affect normal.     No results found for this or any previous visit (from the past 2160 hour(s)).  Assessment/Plan: Laryngitis Increase  fluid intake. Rest voice. Saline nasal spray. Continue allergy medications. Humidifier in bedroom. Probiotic. Salt water gargles, Chloraseptic spray and Tylenol for throat pain. Return to clinic if symptoms not improving.

## 2013-03-09 ENCOUNTER — Ambulatory Visit: Payer: Medicare Other

## 2013-03-09 ENCOUNTER — Ambulatory Visit (INDEPENDENT_AMBULATORY_CARE_PROVIDER_SITE_OTHER): Payer: Medicare Other | Admitting: Internal Medicine

## 2013-03-09 VITALS — BP 142/78 | HR 65 | Temp 98.6°F | Resp 16 | Ht 63.0 in | Wt 178.0 lb

## 2013-03-09 DIAGNOSIS — R509 Fever, unspecified: Secondary | ICD-10-CM

## 2013-03-09 DIAGNOSIS — J040 Acute laryngitis: Secondary | ICD-10-CM

## 2013-03-09 DIAGNOSIS — R059 Cough, unspecified: Secondary | ICD-10-CM

## 2013-03-09 DIAGNOSIS — J209 Acute bronchitis, unspecified: Secondary | ICD-10-CM

## 2013-03-09 DIAGNOSIS — R05 Cough: Secondary | ICD-10-CM

## 2013-03-09 LAB — POCT CBC
Granulocyte percent: 66.5 %G (ref 37–80)
Hemoglobin: 12.6 g/dL (ref 12.2–16.2)
MCH, POC: 30 pg (ref 27–31.2)
MCV: 95.7 fL (ref 80–97)
MID (cbc): 0.9 (ref 0–0.9)
MPV: 7.8 fL (ref 0–99.8)
POC MID %: 7.2 %M (ref 0–12)
Platelet Count, POC: 250 10*3/uL (ref 142–424)
RBC: 4.2 M/uL (ref 4.04–5.48)
WBC: 12 10*3/uL — AB (ref 4.6–10.2)

## 2013-03-09 MED ORDER — AZITHROMYCIN 500 MG PO TABS: 500.0000 mg | ORAL_TABLET | Freq: Every day | ORAL | Status: DC

## 2013-03-09 MED ORDER — ALBUTEROL SULFATE HFA 108 (90 BASE) MCG/ACT IN AERS: 2.0000 | INHALATION_SPRAY | Freq: Four times a day (QID) | RESPIRATORY_TRACT | Status: DC | PRN

## 2013-03-09 MED ORDER — IPRATROPIUM BROMIDE 0.02 % IN SOLN: 0.5000 mg | Freq: Once | RESPIRATORY_TRACT | Status: DC

## 2013-03-09 MED ORDER — BENZONATATE 200 MG PO CAPS: 200.0000 mg | ORAL_CAPSULE | Freq: Three times a day (TID) | ORAL | Status: DC | PRN

## 2013-03-09 MED ORDER — ALBUTEROL SULFATE (2.5 MG/3ML) 0.083% IN NEBU
2.5000 mg | INHALATION_SOLUTION | Freq: Once | RESPIRATORY_TRACT | Status: AC
  Administered 2013-03-09: 2.5 mg via RESPIRATORY_TRACT

## 2013-03-09 NOTE — Patient Instructions (Signed)
Bronchitis Bronchitis is the body's way of reacting to injury and/or infection (inflammation) of the bronchi. Bronchi are the air tubes that extend from the windpipe into the lungs. If the inflammation becomes severe, it may cause shortness of breath. CAUSES  Inflammation may be caused by:  A virus.  Germs (bacteria).  Dust.  Allergens.  Pollutants and many other irritants. The cells lining the bronchial tree are covered with tiny hairs (cilia). These constantly beat upward, away from the lungs, toward the mouth. This keeps the lungs free of pollutants. When these cells become too irritated and are unable to do their job, mucus begins to develop. This causes the characteristic cough of bronchitis. The cough clears the lungs when the cilia are unable to do their job. Without either of these protective mechanisms, the mucus would settle in the lungs. Then you would develop pneumonia. Smoking is a common cause of bronchitis and can contribute to pneumonia. Stopping this habit is the single most important thing you can do to help yourself. TREATMENT   Your caregiver may prescribe an antibiotic if the cough is caused by bacteria. Also, medicines that open up your airways make it easier to breathe. Your caregiver may also recommend or prescribe an expectorant. It will loosen the mucus to be coughed up. Only take over-the-counter or prescription medicines for pain, discomfort, or fever as directed by your caregiver.  Removing whatever causes the problem (smoking, for example) is critical to preventing the problem from getting worse.  Cough suppressants may be prescribed for relief of cough symptoms.  Inhaled medicines may be prescribed to help with symptoms now and to help prevent problems from returning.  For those with recurrent (chronic) bronchitis, there may be a need for steroid medicines. SEEK IMMEDIATE MEDICAL CARE IF:   During treatment, you develop more pus-like mucus (purulent  sputum).  You have a fever.  You become progressively more ill.  You have increased difficulty breathing, wheezing, or shortness of breath. It is necessary to seek immediate medical care if you are elderly or sick from any other disease. MAKE SURE YOU:   Understand these instructions.  Will watch your condition.  Will get help right away if you are not doing well or get worse. Document Released: 03/07/2005 Document Revised: 11/07/2012 Document Reviewed: 10/30/2012 [facility] Patient Information 2014 [place], [place]. Laryngitis At the top of your windpipe is your voice box. It is the source of your voice. Inside your voice box are 2 bands of muscles called vocal cords. When you breathe, your vocal cords are relaxed and open so that air can get into the lungs. When you decide to say something, these cords come together and vibrate. The sound from these vibrations goes into your throat and comes out through your mouth as sound. Laryngitis is an inflammation of the vocal cords that causes hoarseness, cough, loss of voice, sore throat, and dry throat. Laryngitis can be temporary (acute) or long-term (chronic). Most cases of acute laryngitis improve with time.Chronic laryngitis lasts for more than 3 weeks. CAUSES Laryngitis can often be related to excessive smoking, talking, or yelling, as well as inhalation of toxic fumes and allergies. Acute laryngitis is usually caused by a viral infection, vocal strain, measles or mumps, or bacterial infections. Chronic laryngitis is usually caused by vocal cord strain, vocal cord injury, postnasal drip, growths on the vocal cords, or acid reflux. SYMPTOMS   Cough.  Sore throat.  Dry throat. RISK FACTORS  Respiratory infections.  Exposure to irritating substances,  such as cigarette smoke, excessive amounts of alcohol, stomach acids, and workplace chemicals.  Voice trauma, such as vocal cord injury from shouting or speaking too loud. DIAGNOSIS  Your  cargiver will perform a physical exam. During the physical exam, your caregiver will examine your throat. The most common sign of laryngitis is hoarseness. Laryngoscopy may be necessary to confirm the diagnosis of this condition. This procedure allows your caregiver to look into the larynx. HOME CARE INSTRUCTIONS  Drink enough fluids to keep your urine clear or pale yellow.  Rest until you no longer have symptoms or as directed by your caregiver.  Breathe in moist air.  Take all medicine as directed by your caregiver.  Do not smoke.  Talk as little as possible (this includes whispering).  Write on paper instead of talking until your voice is back to normal.  Follow up with your caregiver if your condition has not improved after 10 days. SEEK MEDICAL CARE IF:   You have trouble breathing.  You cough up blood.  You have persistent fever.  You have increasing pain.  You have difficulty swallowing. MAKE SURE YOU:  Understand these instructions.  Will watch your condition.  Will get help right away if you are not doing well or get worse. Document Released: 03/07/2005 Document Revised: 05/30/2011 Document Reviewed: 05/13/2010 [facility] Patient Information 2014 [place], [place].

## 2013-03-09 NOTE — Progress Notes (Signed)
   Subjective:    Patient ID: Carolyn Adams, female    DOB: [birth year], 79 y.o.   MRN: [id number]  HPI Cough, hoarseness, sputum. No smoke, sob,cp. Sick for 5 d. No hx of asthma.   Review of Systems     Objective:   Physical Exam  Vitals reviewed. Constitutional: She is oriented to person, place, and time. She appears well-developed and well-nourished. No distress.  HENT:  Head: Normocephalic.  Right Ear: External ear normal.  Left Ear: External ear normal.  Nose: Nose normal.  Mouth/Throat: Oropharynx is clear and moist.  Eyes: Conjunctivae and EOM are normal. Pupils are equal, round, and reactive to light.  Neck: Normal range of motion. Neck supple. No tracheal deviation present.  Cardiovascular: Normal rate, regular rhythm and normal heart sounds.   Pulmonary/Chest: Effort normal. No respiratory distress. She has no decreased breath sounds. She has wheezes. She has rhonchi. She has no rales. She exhibits tenderness.  Lymphadenopathy:    She has no cervical adenopathy.  Neurological: She is alert and oriented to person, place, and time. She exhibits normal muscle tone. Coordination normal.  Psychiatric: She has a normal mood and affect.     Results for orders placed in visit on 03/09/13  POCT CBC      Result Value Range   WBC 12.0 (*) 4.6 - 10.2 K/uL   Lymph, poc 3.2  0.6 - 3.4   POC LYMPH PERCENT 26.3  10 - 50 %L   MID (cbc) 0.9  0 - 0.9   POC MID % 7.2  0 - 12 %M   POC Granulocyte 8.0 (*) 2 - 6.9   Granulocyte percent 66.5  37 - 80 %G   RBC 4.20  4.04 - 5.48 M/uL   Hemoglobin 12.6  12.2 - 16.2 g/dL   HCT, POC 40.9  81.1 - 47.9 %   MCV 95.7  80 - 97 fL   MCH, POC 30.0  27 - 31.2 pg   MCHC 31.3 (*) 31.8 - 35.4 g/dL   RDW, POC 914     Platelet Count, POC 250  142 - 424 K/uL   MPV 7.8  0 - 99.8 fL   [facility] reading (PRIMARY) by  Dr Perrin Maltese no infiltrate       Assessment & Plan:  Bronchitis/Laryngitis tessalon perles/Zithromax 500mg Albuterol Steam

## 2013-03-21 ENCOUNTER — Other Ambulatory Visit: Payer: Self-pay | Admitting: Internal Medicine

## 2013-03-25 ENCOUNTER — Telehealth: Payer: Self-pay

## 2013-03-25 ENCOUNTER — Ambulatory Visit: Payer: Medicare Other

## 2013-03-25 NOTE — Telephone Encounter (Signed)
Pt's employer Mamie Nickancy Bowers is calling for patient to get a return to work letter written for pt, she states that she is aggravated because the patient came in and had to wait but was unable to see the doctor. Pts employer requests that this be done asap. Best# [phone] Mamie Nick(Nancy Bowers from  [facility])

## 2013-03-25 NOTE — Telephone Encounter (Signed)
Agree, cannot write RTW note as pt not seen today. Last seen 03/09/13, no OOW note written at that time.

## 2013-03-25 NOTE — Telephone Encounter (Signed)
Patient did not see a provider can not give note, correct?

## 2013-03-25 NOTE — Telephone Encounter (Signed)
I also can not speak to her employer. I have called patient to advise.

## 2013-03-25 NOTE — Telephone Encounter (Signed)
Checked w/Amy and Lanora ManisElizabeth because La Roseolleen had asked me to look into this and I had left message for pt to call back. Amy reported that she has talked to pt who is going to come in tomorrow.

## 2013-03-27 ENCOUNTER — Ambulatory Visit (INDEPENDENT_AMBULATORY_CARE_PROVIDER_SITE_OTHER): Payer: Medicare Other | Admitting: Emergency Medicine

## 2013-03-27 VITALS — BP 134/66 | HR 61 | Temp 98.0°F | Resp 18 | Ht 61.5 in | Wt 175.0 lb

## 2013-03-27 DIAGNOSIS — J3489 Other specified disorders of nose and nasal sinuses: Secondary | ICD-10-CM

## 2013-03-27 MED ORDER — MUPIROCIN 2 % EX OINT: 1.0000 "application " | TOPICAL_OINTMENT | Freq: Two times a day (BID) | CUTANEOUS | Status: DC

## 2013-03-27 NOTE — Progress Notes (Signed)
Subjective:    Patient ID: Carolyn Adams, female    DOB: [birth year], 80 y.o.   MRN: [id number]  HPI This chart was scribed for Carolyn Adams- by Landis Gandy, ED Scribe. This patient was seen in room 1 and the patient's care was started at 11:55 AM.  HPI Comments: Carolyn Adams is a 80 y.o. female who presents to the [facility] clinic for follow up of a problem that she has had w/her nose since she had surgery on it. She was seen by Dr. Perrin Maltese a week and a half ago for 5 days of hoarseness and sputum and was dx w/bronchitis/laryngitis and tx w/tessalon perles/zithromax and albuterol. She reports at that time was having problems with her nose and described it as a burning sensation. She reports this problem is still ongoing and has been using saline nasal spary w/out any changes in her symptoms. She had her surgery in 2014 by Dr. Molli Barrows in November to open up an airway through one of her nares..   Patient Active Problem List   Diagnosis Date Noted  . Laryngitis 03/07/2013  . Rhinitis, chronic 05/29/2012  . Left foot pain 05/15/2012  . Preventative health care 09/07/2011  . Alopecia 05/11/2011  . GERD (gastroesophageal reflux disease) 11/12/2010  . Right knee pain 07/27/2010  . HYPERGLYCEMIA 04/01/2010  . CHEST PAIN, ATYPICAL 02/22/2010  . MALAISE AND FATIGUE 05/25/2009  . INTERMITTENT VERTIGO 06/03/2008  . HOT FLASHES 04/10/2008  . OSTEOARTHRITIS 03/13/2007  . HYPERLIPIDEMIA 10/07/2006  . HYPERTENSION 10/07/2006  . HIP PAIN 10/07/2006    Results for orders placed in visit on 03/09/13  POCT CBC      Result Value Range   WBC 12.0 (*) 4.6 - 10.2 K/uL   Lymph, poc 3.2  0.6 - 3.4   POC LYMPH PERCENT 26.3  10 - 50 %L   MID (cbc) 0.9  0 - 0.9   POC MID % 7.2  0 - 12 %M   POC Granulocyte 8.0 (*) 2 - 6.9   Granulocyte percent 66.5  37 - 80 %G   RBC 4.20  4.04 - 5.48 M/uL   Hemoglobin 12.6  12.2 - 16.2 g/dL   HCT, POC 40.9  81.1 - 47.9 %   MCV 95.7  80 -  97 fL   MCH, POC 30.0  27 - 31.2 pg   MCHC 31.3 (*) 31.8 - 35.4 g/dL   RDW, POC 914     Platelet Count, POC 250  142 - 424 K/uL   MPV 7.8  0 - 99.8 fL    History   Social History  . Marital Status: Widowed    Spouse Name: N/A    Number of Children: N/A  . Years of Education: N/A   Occupational History  . 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 Smoking status: Never Smoker   . Smokeless tobacco: Never Used  . Alcohol Use: No  . Drug Use: No  . Sexual Activity: No   Other Topics Concern  . Not on file   Social History Narrative  . No narrative on file    Review of Systems  Constitutional: Negative for fever and chills.  HENT:       Burning sensation in her nose.   Respiratory: Negative for cough and shortness of breath.        Objective:   Physical Exam Nursing note and vitals reviewed. Constitutional: Patient is oriented  to person, place, and time. Patient appears well-developed and well-nourished. No distress.  HENT:  Head: Normocephalic and atraumatic there is tenderness along the right nares. Inspection inside the nose appears normal..  Eyes: Conjunctivae are normal. Right eye exhibits no discharge. Left eye exhibits no discharge.  Neck: Normal range of motion.  Cardiovascular: Normal rate.   Pulmonary/Chest: Effort normal.  Musculoskeletal: Normal range of motion. Patient exhibits no edema.  Neurological: Patient is alert and oriented to person, place, and time.  Skin: Skin is warm and dry.  Psychiatric: Patient has a normal mood and affect. Behavior is normal.    Triage Vitals: BP 134/66  Pulse 61  Temp(Src) 98 F (36.7 C) (Oral)  Resp 18  Ht 5' 1.5" (1.562 m)  Wt 175 lb (79.379 kg)  BMI 32.53 kg/m2  SpO2 97%  DIAGNOSTIC STUDIES: Oxygen Saturation is 97% on room air, normal by my interpretation.    COORDINATION OF CARE: 11:55 AM- Pt advised of plan for treatment and pt agrees.      Assessment & Plan:  Patient  has not been back to work since her sinus surgery. I gave her a note to continue out of work from today until a week from Monday. Advised her to make an appointment to see Dr. Hattie PerchSchoemaker. I did give her some Bactroban nasal ointment to apply twice a day to the inside of her nose I personally performed the services described in this documentation, which was scribed in my presence. The recorded information has been reviewed and is accurate.

## 2013-05-17 ENCOUNTER — Other Ambulatory Visit: Payer: Self-pay | Admitting: Internal Medicine

## 2013-07-08 ENCOUNTER — Other Ambulatory Visit: Payer: Self-pay | Admitting: Internal Medicine

## 2013-08-10 ENCOUNTER — Other Ambulatory Visit: Payer: Self-pay | Admitting: Internal Medicine

## 2013-08-23 ENCOUNTER — Other Ambulatory Visit: Payer: Medicare Other

## 2013-08-30 ENCOUNTER — Encounter: Payer: Self-pay | Admitting: Internal Medicine

## 2013-08-30 ENCOUNTER — Ambulatory Visit (INDEPENDENT_AMBULATORY_CARE_PROVIDER_SITE_OTHER): Payer: Commercial Managed Care - HMO | Admitting: Internal Medicine

## 2013-08-30 VITALS — BP 120/70 | HR 64 | Temp 98.1°F | Ht 62.5 in | Wt 180.0 lb

## 2013-08-30 DIAGNOSIS — Z0000 Encounter for general adult medical examination without abnormal findings: Secondary | ICD-10-CM

## 2013-08-30 DIAGNOSIS — E785 Hyperlipidemia, unspecified: Secondary | ICD-10-CM

## 2013-08-30 DIAGNOSIS — I10 Essential (primary) hypertension: Secondary | ICD-10-CM

## 2013-08-30 DIAGNOSIS — L309 Dermatitis, unspecified: Secondary | ICD-10-CM | POA: Insufficient documentation

## 2013-08-30 DIAGNOSIS — R21 Rash and other nonspecific skin eruption: Secondary | ICD-10-CM

## 2013-08-30 DIAGNOSIS — L259 Unspecified contact dermatitis, unspecified cause: Secondary | ICD-10-CM

## 2013-08-30 MED ORDER — CITALOPRAM HYDROBROMIDE 10 MG PO TABS: 10.0000 mg | ORAL_TABLET | Freq: Every day | ORAL | Status: DC

## 2013-08-30 MED ORDER — TRIAMCINOLONE ACETONIDE 0.1 % EX CREA: 1.0000 "application " | TOPICAL_CREAM | Freq: Two times a day (BID) | CUTANEOUS | Status: DC

## 2013-08-30 MED ORDER — NEBIVOLOL HCL 5 MG PO TABS: 5.0000 mg | ORAL_TABLET | Freq: Every day | ORAL | Status: DC

## 2013-08-30 MED ORDER — ALLOPURINOL 100 MG PO TABS: 100.0000 mg | ORAL_TABLET | Freq: Every day | ORAL | Status: DC

## 2013-08-30 MED ORDER — PRAVASTATIN SODIUM 20 MG PO TABS: 20.0000 mg | ORAL_TABLET | Freq: Every day | ORAL | Status: DC

## 2013-08-30 NOTE — Patient Instructions (Signed)
Our office will contact you re: referral to dermatologist 

## 2013-08-30 NOTE — Assessment & Plan Note (Signed)
Stable.  Continue bystolic.  Monitor electrolytes and kidney function. BP: 120/70 mmHg

## 2013-08-30 NOTE — Assessment & Plan Note (Signed)
Monitor FLP and LFTs.  Continue Pravastatin. 

## 2013-08-30 NOTE — Assessment & Plan Note (Signed)
Reviewed adult health maintenance protocols.  Medicare annual wellness questionnaire reviewed in detail.  See attached sheets.  Screening for mood disorder, depression, memory loss and fall risk negative.  Patient completed screening colonoscopy in 2014. She is up-to-date with adult vaccines.

## 2013-08-30 NOTE — Assessment & Plan Note (Signed)
80 year old white female with pruritic dermatitis on upper arms bilaterally. Refer to dermatologist for further evaluation. Unclear whether her symptoms may be secondary to scabies. Use topical steroids for irritation for now.

## 2013-08-30 NOTE — Progress Notes (Signed)
Subjective:    Patient ID: Carolyn Adams, female    DOB: 1934/01/27, 80 y.o.   MRN: [id number]  HPI  80 year old white female with history of hypertension, hyperlipidemia, and history of chronic sinus infections for routine Medicare physical.  Interval medical history the patient underwent surgery for deviated septum in November of 2014. Surgery went well. She reports she is breathing better from her nose.  Patient complains of pruritic rash on lateral surface of upper arms bilaterally. No improvement with over-the-counter creams.  Hypertension - stable. Medicare wellness questionnaire reviewed in detail. See attached form for details.   Review of Systems   Constitutional: Negative for activity change, appetite change and unexpected weight change.  Eyes: Negative for visual disturbance.  Respiratory: Negative for cough, chest tightness and shortness of breath.   Cardiovascular: Negative for chest pain.  Genitourinary: Negative for difficulty urinating.  Neurological: Negative for headaches.  Gastrointestinal: Negative for abdominal pain, heartburn melena or .  She completed colonoscopy in 2014 Psych: Negative for depression or anxiety Skin: Pruritic rash on upper arms bilaterally    Past Medical History  Diagnosis Date  . Hyperlipidemia   . Hypertension   . Vertigo   . Chronic sinus infection     "terrible" (12/21/2011)  . Anginal pain 12/21/2011  . Chronic headaches     "but not all the time" (12/21/2011)  . Osteoarthritis     "hands" (12/21/2011)  . Allergy     History   Social History  . Marital Status: Widowed    Spouse Name: N/A    Number of Children: N/A  . Years of Education: N/A   Occupational History  . 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 Smoking status: Never Smoker   . Smokeless tobacco: Never Used  . Alcohol Use: No  . Drug Use: No  . Sexual Activity: No   Other Topics Concern  . Not on file   Social  History Narrative   Physician roster   Ophthalmologist-Dr. Elmer PickerHecker   ENT- Dr. Annalee GentaShoemaker    Past Surgical History  Procedure Laterality Date  . Abdominal hysterectomy  1970's    partial  . Nasal septum surgery  01/24/13    [facility]    Family History  Problem Relation Age of Onset  . Cancer Sister   . Coronary artery disease Brother     s/p 3 stents  . Heart disease Brother     s/p 3 stents  . Hypertension Other   . Cancer Other     lung  . Diabetes Neg Hx   . Colon cancer Neg Hx   . Heart attack Son     Allergies  Allergen Reactions  . Methylprednisolone Palpitations    Chest pain  . Ampicillin Other (See Comments)    REACTION: Chest Pain  . Hydrocodone-Acetaminophen Nausea And Vomiting  . Olmesartan Medoxomil Nausea Only    REACTION: stomach upse    Current Outpatient Prescriptions on File Prior to Visit  Medication Sig Dispense Refill  . albuterol (PROVENTIL HFA;VENTOLIN HFA) 108 (90 BASE) MCG/ACT inhaler Inhale 2 puffs into the lungs every 6 (six) hours as needed for wheezing or shortness of breath.  1 Inhaler  1  . aspirin 81 MG EC tablet Take 81 mg by mouth daily.        Marland Kitchen. azelastine (ASTELIN) 137 MCG/SPRAY nasal spray Place 2 sprays into the nose 2 (two) times daily. Use in each nostril as  directed  30 mL  5  . mupirocin ointment (BACTROBAN) 2 % Place 1 application into the nose 2 (two) times daily.  22 g  0  . vitamin B-12 (CYANOCOBALAMIN) 100 MCG tablet Take 50 mcg by mouth daily.         Current Facility-Administered Medications on File Prior to Visit  Medication Dose Route Frequency Provider Last Rate Last Dose  . ipratropium (ATROVENT) nebulizer solution 0.5 mg  0.5 mg Nebulization Once Jonita Albeehris W Guest, MD        BP 120/70  Pulse 64  Temp(Src) 98.1 F (36.7 C) (Oral)  Ht 5' 2.5" (1.588 m)  Wt 180 lb (81.647 kg)  BMI 32.38 kg/m2      Objective:   Physical Exam  Constitutional: She is oriented to person, place, and time. She appears  well-developed and well-nourished. No distress.  HENT:  Head: Normocephalic and atraumatic.  Right Ear: External ear normal.  Left Ear: External ear normal.  Mouth/Throat: Oropharynx is clear and moist.  Eyes: Conjunctivae and EOM are normal. Pupils are equal, round, and reactive to light. No scleral icterus.  Neck: Neck supple.  No carotid bruit  Cardiovascular: Normal rate, regular rhythm and normal heart sounds.   No murmur heard. Pulmonary/Chest: Effort normal and breath sounds normal. She has no wheezes.  Abdominal: Soft. Bowel sounds are normal. She exhibits no mass. There is no tenderness.  Musculoskeletal:  Trace lower extremity edema bilaterally  Lymphadenopathy:    She has no cervical adenopathy.  Neurological: She is alert and oriented to person, place, and time. No cranial nerve deficit.  Skin:  Maculopapular rash upper arms bilaterally, excoriated erythematous areas  Psychiatric: She has a normal mood and affect. Her behavior is normal.          Assessment & Plan:

## 2013-08-30 NOTE — Progress Notes (Signed)
Pre visit review using our clinic review tool, if applicable. No additional management support is needed unless otherwise documented below in the visit note. 

## 2013-08-31 ENCOUNTER — Telehealth: Payer: Self-pay | Admitting: Internal Medicine

## 2013-08-31 NOTE — Telephone Encounter (Signed)
Relevant patient education mailed to patient.  

## 2013-09-05 ENCOUNTER — Telehealth: Payer: Self-pay | Admitting: Internal Medicine

## 2013-09-05 DIAGNOSIS — Z9889 Other specified postprocedural states: Secondary | ICD-10-CM

## 2013-09-05 NOTE — Telephone Encounter (Addendum)
Pt needs a referral to see dr  Annalee GentaShoemaker for follow up on nasal surgery. Pt has [employer] and appt is on  09/27/13 at  340PM. Pt had surgery  In Jan 2015 and had a different insurance. Can we do referral?

## 2013-09-06 NOTE — Telephone Encounter (Signed)
yes

## 2013-09-09 NOTE — Telephone Encounter (Signed)
Referral order placed.

## 2013-11-28 ENCOUNTER — Other Ambulatory Visit: Payer: Self-pay | Admitting: *Deleted

## 2013-11-28 MED ORDER — CITALOPRAM HYDROBROMIDE 10 MG PO TABS: 10.0000 mg | ORAL_TABLET | Freq: Every day | ORAL | Status: DC

## 2013-11-28 MED ORDER — NEBIVOLOL HCL 5 MG PO TABS: 5.0000 mg | ORAL_TABLET | Freq: Every day | ORAL | Status: DC

## 2013-11-28 MED ORDER — TRIAMCINOLONE ACETONIDE 0.1 % EX CREA: 1.0000 "application " | TOPICAL_CREAM | Freq: Two times a day (BID) | CUTANEOUS | Status: DC

## 2013-11-28 MED ORDER — ALLOPURINOL 100 MG PO TABS: 100.0000 mg | ORAL_TABLET | Freq: Every day | ORAL | Status: DC

## 2014-02-03 ENCOUNTER — Other Ambulatory Visit (HOSPITAL_COMMUNITY): Payer: Self-pay | Admitting: Cardiology

## 2014-02-03 DIAGNOSIS — R079 Chest pain, unspecified: Secondary | ICD-10-CM

## 2014-02-10 ENCOUNTER — Encounter (HOSPITAL_COMMUNITY)
Admission: RE | Admit: 2014-02-10 | Discharge: 2014-02-10 | Disposition: A | Discharge status: Home / Self Care | Payer: Medicare HMO | Source: Ambulatory Visit | Attending: Cardiology | Admitting: Cardiology

## 2014-02-10 ENCOUNTER — Other Ambulatory Visit: Payer: Self-pay

## 2014-02-10 DIAGNOSIS — R079 Chest pain, unspecified: Secondary | ICD-10-CM | POA: Diagnosis present

## 2014-02-10 MED ORDER — TECHNETIUM TC 99M SESTAMIBI GENERIC - CARDIOLITE
30.0000 | Freq: Once | INTRAVENOUS | Status: AC | PRN
  Administered 2014-02-10: 30 via INTRAVENOUS

## 2014-02-10 MED ORDER — REGADENOSON 0.4 MG/5ML IV SOLN
INTRAVENOUS | Status: AC
  Administered 2014-02-10: 0.4 mg via INTRAVENOUS
  Filled 2014-02-10: qty 5

## 2014-02-10 MED ORDER — TECHNETIUM TC 99M SESTAMIBI GENERIC - CARDIOLITE
10.0000 | Freq: Once | INTRAVENOUS | Status: AC | PRN
  Administered 2014-02-10: 10 via INTRAVENOUS

## 2014-02-10 MED ORDER — REGADENOSON 0.4 MG/5ML IV SOLN
0.4000 mg | Freq: Once | INTRAVENOUS | Status: AC
  Administered 2014-02-10: 0.4 mg via INTRAVENOUS

## 2014-02-12 ENCOUNTER — Encounter: Payer: Self-pay | Admitting: Internal Medicine

## 2014-02-12 ENCOUNTER — Ambulatory Visit (INDEPENDENT_AMBULATORY_CARE_PROVIDER_SITE_OTHER): Payer: Commercial Managed Care - HMO | Admitting: Internal Medicine

## 2014-02-12 VITALS — BP 140/76 | HR 68 | Temp 97.7°F | Ht 62.5 in | Wt 173.0 lb

## 2014-02-12 DIAGNOSIS — R079 Chest pain, unspecified: Secondary | ICD-10-CM

## 2014-02-12 DIAGNOSIS — E785 Hyperlipidemia, unspecified: Secondary | ICD-10-CM

## 2014-02-12 DIAGNOSIS — I10 Essential (primary) hypertension: Secondary | ICD-10-CM

## 2014-02-12 NOTE — Progress Notes (Signed)
Subjective:    Patient ID: Carolyn Adams, female    DOB: 08-10-1933, 80 y.o.   MRN: [id number]  HPI  80 year old white female with history of hypertension and hyperlipidemia for routine follow-up. Interval medical history-patient reports seeing her cardiologist for nonexertional chest pain. She reports completing chemical stress test. Results are pending.  Patient reports chest pains have improved.  Hypertension-stable  Hyperlipidemia-stable  Review of Systems Negative for chest pain or shortness of breath    Past Medical History  Diagnosis Date  . Hyperlipidemia   . Hypertension   . Vertigo   . Chronic sinus infection     "terrible" (12/21/2011)  . Anginal pain 12/21/2011  . Chronic headaches     "but not all the time" (12/21/2011)  . Osteoarthritis     "hands" (12/21/2011)  . Allergy     History   Social History  . Marital Status: Widowed    Spouse Name: N/A    Number of Children: N/A  . Years of Education: N/A   Occupational History  . 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 Smoking status: Never Smoker   . Smokeless tobacco: Never Used  . Alcohol Use: No  . Drug Use: No  . Sexual Activity: No   Other Topics Concern  . Not on file   Social History Narrative   Physician roster   Ophthalmologist-Dr. Elmer PickerHecker   ENT- Dr. Annalee GentaShoemaker    Past Surgical History  Procedure Laterality Date  . Abdominal hysterectomy  1970's    partial  . Nasal septum surgery  01/24/13    [facility]    Family History  Problem Relation Age of Onset  . Cancer Sister   . Coronary artery disease Brother     s/p 3 stents  . Heart disease Brother     s/p 3 stents  . Hypertension Other   . Cancer Other     lung  . Diabetes Neg Hx   . Colon cancer Neg Hx   . Heart attack Son     Allergies  Allergen Reactions  . Methylprednisolone Palpitations    Chest pain  . Ampicillin Other (See Comments)    REACTION: Chest Pain  .  Hydrocodone-Acetaminophen Nausea And Vomiting  . Olmesartan Medoxomil Nausea Only    REACTION: stomach upse    Current Outpatient Prescriptions on File Prior to Visit  Medication Sig Dispense Refill  . albuterol (PROVENTIL HFA;VENTOLIN HFA) 108 (90 BASE) MCG/ACT inhaler Inhale 2 puffs into the lungs every 6 (six) hours as needed for wheezing or shortness of breath. 1 Inhaler 1  . allopurinol (ZYLOPRIM) 100 MG tablet Take 1 tablet (100 mg total) by mouth daily. 90 tablet 1  . aspirin 81 MG EC tablet Take 81 mg by mouth daily.      Marland Kitchen. azelastine (ASTELIN) 137 MCG/SPRAY nasal spray Place 2 sprays into the nose 2 (two) times daily. Use in each nostril as directed 30 mL 5  . citalopram (CELEXA) 10 MG tablet Take 1 tablet (10 mg total) by mouth daily. 90 tablet 1  . mupirocin ointment (BACTROBAN) 2 % Place 1 application into the nose 2 (two) times daily. 22 g 0  . nebivolol (BYSTOLIC) 5 MG tablet Take 1 tablet (5 mg total) by mouth daily. 90 tablet 1  . pravastatin (PRAVACHOL) 20 MG tablet Take 1 tablet (20 mg total) by mouth daily. 90 tablet 1  . triamcinolone cream (KENALOG) 0.1 % Apply 1  application topically 2 (two) times daily. 30 g 1  . vitamin B-12 (CYANOCOBALAMIN) 100 MCG tablet Take 50 mcg by mouth daily.       Current Facility-Administered Medications on File Prior to Visit  Medication Dose Route Frequency Provider Last Rate Last Dose  . ipratropium (ATROVENT) nebulizer solution 0.5 mg  0.5 mg Nebulization Once Jonita Albeehris W Guest, MD        BP 140/76 mmHg  Pulse 68  Temp(Src) 97.7 F (36.5 C) (Oral)  Ht 5' 2.5" (1.588 m)  Wt 173 lb (78.472 kg)  BMI 31.12 kg/m2    Objective:   Physical Exam  Constitutional: She is oriented to person, place, and time. She appears well-developed and well-nourished. No distress.  HENT:  Head: Normocephalic and atraumatic.  Cardiovascular: Normal rate, regular rhythm and normal heart sounds.   No murmur heard. Pulmonary/Chest: Effort normal and  breath sounds normal. She has no wheezes.  Abdominal: Soft. There is no tenderness.  Musculoskeletal: She exhibits no edema.  Neurological: She is alert and oriented to person, place, and time. No cranial nerve deficit.  Skin: Skin is warm and dry.  Psychiatric: She has a normal mood and affect. Her behavior is normal.          Assessment & Plan:

## 2014-02-12 NOTE — Assessment & Plan Note (Signed)
Patient recently evaluated by cardiologist - Dr. Sharyn LullHarwani.  She underwent stress testing and is awaiting results.  Her symptoms improved.  Follow up with cardiology.  Return if cardiac stress testing negative but persistent symptoms.

## 2014-02-12 NOTE — Progress Notes (Signed)
Pre visit review using our clinic review tool, if applicable. No additional management support is needed unless otherwise documented below in the visit note. 

## 2014-02-12 NOTE — Assessment & Plan Note (Signed)
Stable.  No change in medication.  Monitor electrolytes and kidney function. BP: 140/76 mmHg

## 2014-02-12 NOTE — Assessment & Plan Note (Signed)
Monitor lipid panel and LFTs.  Continue pravastatin.

## 2014-02-13 LAB — HEPATIC FUNCTION PANEL
ALK PHOS: 88 U/L (ref 39–117)
ALT: 11 U/L (ref 0–35)
AST: 17 U/L (ref 0–37)
Albumin: 3.9 g/dL (ref 3.5–5.2)
BILIRUBIN DIRECT: 0.1 mg/dL (ref 0.0–0.3)
BILIRUBIN INDIRECT: 0.3 mg/dL (ref 0.2–1.2)
Total Bilirubin: 0.4 mg/dL (ref 0.2–1.2)
Total Protein: 7.1 g/dL (ref 6.0–8.3)

## 2014-02-13 LAB — BASIC METABOLIC PANEL
BUN: 18 mg/dL (ref 6–23)
CO2: 25 mEq/L (ref 19–32)
CREATININE: 1 mg/dL (ref 0.50–1.10)
Calcium: 9.5 mg/dL (ref 8.4–10.5)
Chloride: 103 mEq/L (ref 96–112)
Glucose, Bld: 79 mg/dL (ref 70–99)
Potassium: 4.3 mEq/L (ref 3.5–5.3)
SODIUM: 139 meq/L (ref 135–145)

## 2014-02-13 LAB — LIPID PANEL
CHOL/HDL RATIO: 3.4 ratio
Cholesterol: 218 mg/dL — ABNORMAL HIGH (ref 0–200)
HDL: 64 mg/dL (ref >39)
LDL CALC: 105 mg/dL — AB (ref 0–99)
Triglycerides: 247 mg/dL — ABNORMAL HIGH (ref <150)
VLDL: 49 mg/dL — ABNORMAL HIGH (ref 0–40)

## 2014-02-13 LAB — TSH: TSH: 2.259 u[IU]/mL (ref 0.350–4.500)

## 2014-02-27 ENCOUNTER — Encounter (HOSPITAL_COMMUNITY): Payer: Self-pay | Admitting: Cardiology

## 2014-03-03 ENCOUNTER — Ambulatory Visit: Payer: Commercial Managed Care - HMO | Admitting: Internal Medicine

## 2014-04-08 ENCOUNTER — Ambulatory Visit (INDEPENDENT_AMBULATORY_CARE_PROVIDER_SITE_OTHER): Payer: Commercial Managed Care - HMO | Admitting: Family Medicine

## 2014-04-08 ENCOUNTER — Encounter: Payer: Self-pay | Admitting: Family Medicine

## 2014-04-08 VITALS — BP 142/80 | Temp 98.6°F | Wt 171.8 lb

## 2014-04-08 DIAGNOSIS — R21 Rash and other nonspecific skin eruption: Secondary | ICD-10-CM

## 2014-04-08 MED ORDER — HYDROXYZINE HCL 25 MG PO TABS: ORAL_TABLET | ORAL | Status: DC

## 2014-04-08 MED ORDER — TRIAMCINOLONE ACETONIDE 0.1 % EX CREA: 1.0000 "application " | TOPICAL_CREAM | Freq: Two times a day (BID) | CUTANEOUS | Status: DC

## 2014-04-08 NOTE — Patient Instructions (Signed)
Avoid scratching as much as possible Apply prescribed topical steroid twice daily and recommend combining 50:50 with over-the-counter moisturizer such as Cetaphil, Eucerin, or Lac-Hydrin. Consider hydroxyzine tablet at night to reduce itching

## 2014-04-08 NOTE — Progress Notes (Signed)
   Subjective:    Patient ID: Carolyn Adams, female    DOB: [birth year], 81 y.o.   MRN: [id number]  HPI Patient is here today with pruritic rash on both upper extremities with onset sometime around February of last year. She states she had nasal surgery she is convinced that this started after her nasal surgery. She has tried moisturizers without improvement. She has steroid cream on her list but denies using this. She is frequently scratching this at night. Her rash is confined to her upper extremities and minimally to the upper back but no lower extremity or trunk involvement. Denies any change of soaps or detergents. No new medications. She tried some type of over-the-counter antihistamine without relief.  Past Medical History  Diagnosis Date  . Hyperlipidemia   . Hypertension   . Vertigo   . Chronic sinus infection     "terrible" (12/21/2011)  . Anginal pain 12/21/2011  . Chronic headaches     "but not all the time" (12/21/2011)  . Osteoarthritis     "hands" (12/21/2011)  . Allergy    Past Surgical History  Procedure Laterality Date  . Abdominal hysterectomy  1970's    partial  . Nasal septum surgery  01/24/13    [facility]  . Left heart catheterization with coronary angiogram N/A 12/22/2011    Procedure: LEFT HEART CATHETERIZATION WITH CORONARY ANGIOGRAM;  Surgeon: Robynn PaneMohan N Harwani, MD;  [facility];  Service: Cardiovascular;  Laterality: N/A;    reports that she has never smoked. She has never used smokeless tobacco. She reports that she does not drink alcohol or use illicit drugs. family history includes Cancer in her other and sister; Coronary artery disease in her brother; Heart attack in her son; Heart disease in her brother; Hypertension in her other. There is no history of Diabetes or Colon cancer. Allergies  Allergen Reactions  . Methylprednisolone Palpitations    Chest pain  . Ampicillin Other (See Comments)    REACTION: Chest Pain  .  Hydrocodone-Acetaminophen Nausea And Vomiting  . Olmesartan Medoxomil Nausea Only    REACTION: stomach upse      Review of Systems  Constitutional: Negative for fever and chills.  Skin: Positive for rash.  Hematological: Negative for adenopathy.       Objective:   Physical Exam  Constitutional: She appears well-developed and well-nourished.  Cardiovascular: Normal rate and regular rhythm.   Pulmonary/Chest: Effort normal and breath sounds normal. No respiratory distress. She has no wheezes. She has no rales.  Skin: Rash noted.  Upper extremities reveals extreme dry skin with multiple excoriations and some nonspecific nodules. No pustules. Nontender. She has a couple of minimally excoriated areas upper back          Assessment & Plan:  Pruritic skin rash. Suspect a lot of this is related to dryness and differential includes prurigo nodularis. She has very excoriated upper extremities. We recommend avoid scratching as much as possible. Triamcinolone 0.1% cream twice a day and combined 50-50 with over-the-counter moisturizer. Atarax at night. Consider dermatology referral for not improving over the next few weeks

## 2014-04-08 NOTE — Progress Notes (Signed)
Pre visit review using our clinic review tool, if applicable. No additional management support is needed unless otherwise documented below in the visit note. 

## 2014-05-20 ENCOUNTER — Ambulatory Visit (INDEPENDENT_AMBULATORY_CARE_PROVIDER_SITE_OTHER): Payer: Commercial Managed Care - HMO | Admitting: Family Medicine

## 2014-05-20 ENCOUNTER — Encounter: Payer: Self-pay | Admitting: Family Medicine

## 2014-05-20 VITALS — BP 172/94 | HR 63 | Temp 98.0°F | Ht 62.5 in | Wt 172.0 lb

## 2014-05-20 DIAGNOSIS — J0100 Acute maxillary sinusitis, unspecified: Secondary | ICD-10-CM

## 2014-05-20 MED ORDER — AZITHROMYCIN 250 MG PO TABS: ORAL_TABLET | ORAL | Status: DC

## 2014-05-20 NOTE — Progress Notes (Signed)
   Subjective:    Patient ID: Carolyn Adams, female    DOB: 1933-10-20, 81 y.o.   MRN: [id number]  HPI Here for 10 days of sinus pressure, PND, ST, and a dry cough.   Review of Systems  Constitutional: Negative.   HENT: Positive for congestion, postnasal drip and sinus pressure.   Eyes: Negative.   Respiratory: Positive for cough.        Objective:   Physical Exam  Constitutional: She appears well-developed and well-nourished.  HENT:  Right Ear: External ear normal.  Left Ear: External ear normal.  Nose: Nose normal.  Mouth/Throat: Oropharynx is clear and moist.  Eyes: Conjunctivae are normal.  Pulmonary/Chest: Effort normal and breath sounds normal.  Lymphadenopathy:    She has no cervical adenopathy.          Assessment & Plan:  Add Mucinex

## 2014-05-20 NOTE — Progress Notes (Signed)
Pre visit review using our clinic review tool, if applicable. No additional management support is needed unless otherwise documented below in the visit note. 

## 2014-05-28 DIAGNOSIS — I10 Essential (primary) hypertension: Secondary | ICD-10-CM | POA: Diagnosis not present

## 2014-05-28 DIAGNOSIS — I2510 Atherosclerotic heart disease of native coronary artery without angina pectoris: Secondary | ICD-10-CM | POA: Diagnosis not present

## 2014-05-28 DIAGNOSIS — E785 Hyperlipidemia, unspecified: Secondary | ICD-10-CM | POA: Diagnosis not present

## 2014-05-28 DIAGNOSIS — I209 Angina pectoris, unspecified: Secondary | ICD-10-CM | POA: Diagnosis not present

## 2014-05-28 DIAGNOSIS — R7309 Other abnormal glucose: Secondary | ICD-10-CM | POA: Diagnosis not present

## 2014-06-17 ENCOUNTER — Other Ambulatory Visit: Payer: Self-pay | Admitting: Internal Medicine

## 2014-09-09 DIAGNOSIS — H43811 Vitreous degeneration, right eye: Secondary | ICD-10-CM | POA: Diagnosis not present

## 2014-09-09 DIAGNOSIS — H35373 Puckering of macula, bilateral: Secondary | ICD-10-CM | POA: Diagnosis not present

## 2014-09-09 DIAGNOSIS — H35033 Hypertensive retinopathy, bilateral: Secondary | ICD-10-CM | POA: Diagnosis not present

## 2014-09-09 DIAGNOSIS — H26493 Other secondary cataract, bilateral: Secondary | ICD-10-CM | POA: Diagnosis not present

## 2014-09-24 DIAGNOSIS — I2510 Atherosclerotic heart disease of native coronary artery without angina pectoris: Secondary | ICD-10-CM | POA: Diagnosis not present

## 2014-09-24 DIAGNOSIS — I209 Angina pectoris, unspecified: Secondary | ICD-10-CM | POA: Diagnosis not present

## 2014-09-24 DIAGNOSIS — R7309 Other abnormal glucose: Secondary | ICD-10-CM | POA: Diagnosis not present

## 2014-09-24 DIAGNOSIS — E785 Hyperlipidemia, unspecified: Secondary | ICD-10-CM | POA: Diagnosis not present

## 2014-09-24 DIAGNOSIS — I10 Essential (primary) hypertension: Secondary | ICD-10-CM | POA: Diagnosis not present

## 2014-11-07 ENCOUNTER — Other Ambulatory Visit: Payer: Self-pay | Admitting: Internal Medicine

## 2014-12-02 ENCOUNTER — Ambulatory Visit (INDEPENDENT_AMBULATORY_CARE_PROVIDER_SITE_OTHER): Payer: Commercial Managed Care - HMO | Admitting: Family Medicine

## 2014-12-02 ENCOUNTER — Encounter: Payer: Self-pay | Admitting: Family Medicine

## 2014-12-02 VITALS — BP 131/82 | HR 93 | Temp 97.5°F | Ht 61.75 in | Wt 170.6 lb

## 2014-12-02 DIAGNOSIS — R51 Headache: Secondary | ICD-10-CM

## 2014-12-02 DIAGNOSIS — M109 Gout, unspecified: Secondary | ICD-10-CM

## 2014-12-02 DIAGNOSIS — I10 Essential (primary) hypertension: Secondary | ICD-10-CM | POA: Diagnosis not present

## 2014-12-02 DIAGNOSIS — L309 Dermatitis, unspecified: Secondary | ICD-10-CM

## 2014-12-02 DIAGNOSIS — R519 Headache, unspecified: Secondary | ICD-10-CM

## 2014-12-02 DIAGNOSIS — M542 Cervicalgia: Secondary | ICD-10-CM

## 2014-12-02 DIAGNOSIS — M1990 Unspecified osteoarthritis, unspecified site: Secondary | ICD-10-CM

## 2014-12-02 DIAGNOSIS — E785 Hyperlipidemia, unspecified: Secondary | ICD-10-CM

## 2014-12-02 NOTE — Assessment & Plan Note (Signed)
Right elbow intermittently over past 2 years

## 2014-12-02 NOTE — Assessment & Plan Note (Signed)
Well controlled, no changes to meds. Encouraged heart healthy diet such as the DASH diet and exercise as tolerated.  °

## 2014-12-02 NOTE — Progress Notes (Signed)
Pre visit review using our clinic review tool, if applicable. No additional management support is needed unless otherwise documented below in the visit note. 

## 2014-12-02 NOTE — Patient Instructions (Addendum)
Ask your insurance if they cover the Prevnar (PCV13) pneumonia shot, you had the Pneumovax (PCV 23) in 2014.  Witch Hazel Astringent for arm For neck ice, Salon pas gel or Aspercreme twice daily   Preventive Care for Adults A healthy lifestyle and preventive care can promote health and wellness. Preventive health guidelines for women include the following key practices.  A routine yearly physical is a good way to check with your health care provider about your health and preventive screening. It is a chance to share any concerns and updates on your health and to receive a thorough exam.  Visit your dentist for a routine exam and preventive care every 6 months. Brush your teeth twice a day and floss once a day. Good oral hygiene prevents tooth decay and gum disease.  The frequency of eye exams is based on your age, health, family medical history, use of contact lenses, and other factors. Follow your health care provider's recommendations for frequency of eye exams.  Eat a healthy diet. Foods like vegetables, fruits, whole grains, low-fat dairy products, and lean protein foods contain the nutrients you need without too many calories. Decrease your intake of foods high in solid fats, added sugars, and salt. Eat the right amount of calories for you.Get information about a proper diet from your health care provider, if necessary.  Regular physical exercise is one of the most important things you can do for your health. Most adults should get at least 150 minutes of moderate-intensity exercise (any activity that increases your heart rate and causes you to sweat) each week. In addition, most adults need muscle-strengthening exercises on 2 or more days a week.  Maintain a healthy weight. The body mass index (BMI) is a screening tool to identify possible weight problems. It provides an estimate of body fat based on height and weight. Your health care provider can find your BMI and can help you achieve or  maintain a healthy weight.For adults 20 years and older:  A BMI below 18.5 is considered underweight.  A BMI of 18.5 to 24.9 is normal.  A BMI of 25 to 29.9 is considered overweight.  A BMI of 30 and above is considered obese.  Maintain normal blood lipids and cholesterol levels by exercising and minimizing your intake of saturated fat. Eat a balanced diet with plenty of fruit and vegetables. Blood tests for lipids and cholesterol should begin at age 29 and be repeated every 5 years. If your lipid or cholesterol levels are high, you are over 50, or you are at high risk for heart disease, you may need your cholesterol levels checked more frequently.Ongoing high lipid and cholesterol levels should be treated with medicines if diet and exercise are not working.  If you smoke, find out from your health care provider how to quit. If you do not use tobacco, do not start.  Lung cancer screening is recommended for adults aged 74-80 years who are at high risk for developing lung cancer because of a history of smoking. A yearly low-dose CT scan of the lungs is recommended for people who have at least a 30-pack-year history of smoking and are a current smoker or have quit within the past 15 years. A pack year of smoking is smoking an average of 1 pack of cigarettes a day for 1 year (for example: 1 pack a day for 30 years or 2 packs a day for 15 years). Yearly screening should continue until the smoker has stopped smoking for at  least 15 years. Yearly screening should be stopped for people who develop a health problem that would prevent them from having lung cancer treatment.  If you are pregnant, do not drink alcohol. If you are breastfeeding, be very cautious about drinking alcohol. If you are not pregnant and choose to drink alcohol, do not have more than 1 drink per day. One drink is considered to be 12 ounces (355 mL) of beer, 5 ounces (148 mL) of wine, or 1.5 ounces (44 mL) of liquor.  Avoid use of  street drugs. Do not share needles with anyone. Ask for help if you need support or instructions about stopping the use of drugs.  High blood pressure causes heart disease and increases the risk of stroke. Your blood pressure should be checked at least every 1 to 2 years. Ongoing high blood pressure should be treated with medicines if weight loss and exercise do not work.  If you are 24-32 years old, ask your health care provider if you should take aspirin to prevent strokes.  Diabetes screening involves taking a blood sample to check your fasting blood sugar level. This should be done once every 3 years, after age 41, if you are within normal weight and without risk factors for diabetes. Testing should be considered at a younger age or be carried out more frequently if you are overweight and have at least 1 risk factor for diabetes.  Breast cancer screening is essential preventive care for women. You should practice "breast self-awareness." This means understanding the normal appearance and feel of your breasts and may include breast self-examination. Any changes detected, no matter how small, should be reported to a health care provider. Women in their 26s and 30s should have a clinical breast exam (CBE) by a health care provider as part of a regular health exam every 1 to 3 years. After age 36, women should have a CBE every year. Starting at age 44, women should consider having a mammogram (breast X-ray test) every year. Women who have a family history of breast cancer should talk to their health care provider about genetic screening. Women at a high risk of breast cancer should talk to their health care providers about having an MRI and a mammogram every year.  Breast cancer gene (BRCA)-related cancer risk assessment is recommended for women who have family members with BRCA-related cancers. BRCA-related cancers include breast, ovarian, tubal, and peritoneal cancers. Having family members with these  cancers may be associated with an increased risk for harmful changes (mutations) in the breast cancer genes BRCA1 and BRCA2. Results of the assessment will determine the need for genetic counseling and BRCA1 and BRCA2 testing.  Routine pelvic exams to screen for cancer are no longer recommended for nonpregnant women who are considered low risk for cancer of the pelvic organs (ovaries, uterus, and vagina) and who do not have symptoms. Ask your health care provider if a screening pelvic exam is right for you.  If you have had past treatment for cervical cancer or a condition that could lead to cancer, you need Pap tests and screening for cancer for at least 20 years after your treatment. If Pap tests have been discontinued, your risk factors (such as having a new sexual partner) need to be reassessed to determine if screening should be resumed. Some women have medical problems that increase the chance of getting cervical cancer. In these cases, your health care provider may recommend more frequent screening and Pap tests.  The HPV test  is an additional test that may be used for cervical cancer screening. The HPV test looks for the virus that can cause the cell changes on the cervix. The cells collected during the Pap test can be tested for HPV. The HPV test could be used to screen women aged 42 years and older, and should be used in women of any age who have unclear Pap test results. After the age of 74, women should have HPV testing at the same frequency as a Pap test.  Colorectal cancer can be detected and often prevented. Most routine colorectal cancer screening begins at the age of 60 years and continues through age 87 years. However, your health care provider may recommend screening at an earlier age if you have risk factors for colon cancer. On a yearly basis, your health care provider may provide home test kits to check for hidden blood in the stool. Use of a small camera at the end of a tube, to  directly examine the colon (sigmoidoscopy or colonoscopy), can detect the earliest forms of colorectal cancer. Talk to your health care provider about this at age 52, when routine screening begins. Direct exam of the colon should be repeated every 5-10 years through age 78 years, unless early forms of pre-cancerous polyps or small growths are found.  People who are at an increased risk for hepatitis B should be screened for this virus. You are considered at high risk for hepatitis B if:  You were born in a country where hepatitis B occurs often. Talk with your health care provider about which countries are considered high risk.  Your parents were born in a high-risk country and you have not received a shot to protect against hepatitis B (hepatitis B vaccine).  You have HIV or AIDS.  You use needles to inject street drugs.  You live with, or have sex with, someone who has hepatitis B.  You get hemodialysis treatment.  You take certain medicines for conditions like cancer, organ transplantation, and autoimmune conditions.  Hepatitis C blood testing is recommended for all people born from [birth year] through 1965 and any individual with known risks for hepatitis C.  Practice safe sex. Use condoms and avoid high-risk sexual practices to reduce the spread of sexually transmitted infections (STIs). STIs include gonorrhea, chlamydia, syphilis, trichomonas, herpes, HPV, and human immunodeficiency virus (HIV). Herpes, HIV, and HPV are viral illnesses that have no cure. They can result in disability, cancer, and death.  You should be screened for sexually transmitted illnesses (STIs) including gonorrhea and chlamydia if:  You are sexually active and are younger than 24 years.  You are older than 24 years and your health care provider tells you that you are at risk for this type of infection.  Your sexual activity has changed since you were last screened and you are at an increased risk for chlamydia or  gonorrhea. Ask your health care provider if you are at risk.  If you are at risk of being infected with HIV, it is recommended that you take a prescription medicine daily to prevent HIV infection. This is called preexposure prophylaxis (PrEP). You are considered at risk if:  You are a heterosexual woman, are sexually active, and are at increased risk for HIV infection.  You take drugs by injection.  You are sexually active with a partner who has HIV.  Talk with your health care provider about whether you are at high risk of being infected with HIV. If you choose to begin  PrEP, you should first be tested for HIV. You should then be tested every 3 months for as long as you are taking PrEP.  Osteoporosis is a disease in which the bones lose minerals and strength with aging. This can result in serious bone fractures or breaks. The risk of osteoporosis can be identified using a bone density scan. Women ages 55 years and over and women at risk for fractures or osteoporosis should discuss screening with their health care providers. Ask your health care provider whether you should take a calcium supplement or vitamin D to reduce the rate of osteoporosis.  Menopause can be associated with physical symptoms and risks. Hormone replacement therapy is available to decrease symptoms and risks. You should talk to your health care provider about whether hormone replacement therapy is right for you.  Use sunscreen. Apply sunscreen liberally and repeatedly throughout the day. You should seek shade when your shadow is shorter than you. Protect yourself by wearing long sleeves, pants, a wide-brimmed hat, and sunglasses year round, whenever you are outdoors.  Once a month, do a whole body skin exam, using a mirror to look at the skin on your back. Tell your health care provider of new moles, moles that have irregular borders, moles that are larger than a pencil eraser, or moles that have changed in shape or  color.  Stay current with required vaccines (immunizations).  Influenza vaccine. All adults should be immunized every year.  Tetanus, diphtheria, and acellular pertussis (Td, Tdap) vaccine. Pregnant women should receive 1 dose of Tdap vaccine during each pregnancy. The dose should be obtained regardless of the length of time since the last dose. Immunization is preferred during the 27th-36th week of gestation. An adult who has not previously received Tdap or who does not know her vaccine status should receive 1 dose of Tdap. This initial dose should be followed by tetanus and diphtheria toxoids (Td) booster doses every 10 years. Adults with an unknown or incomplete history of completing a 3-dose immunization series with Td-containing vaccines should begin or complete a primary immunization series including a Tdap dose. Adults should receive a Td booster every 10 years.  Varicella vaccine. An adult without evidence of immunity to varicella should receive 2 doses or a second dose if she has previously received 1 dose. Pregnant females who do not have evidence of immunity should receive the first dose after pregnancy. This first dose should be obtained before leaving the health care facility. The second dose should be obtained 4-8 weeks after the first dose.  Human papillomavirus (HPV) vaccine. Females aged 13-26 years who have not received the vaccine previously should obtain the 3-dose series. The vaccine is not recommended for use in pregnant females. However, pregnancy testing is not needed before receiving a dose. If a female is found to be pregnant after receiving a dose, no treatment is needed. In that case, the remaining doses should be delayed until after the pregnancy. Immunization is recommended for any person with an immunocompromised condition through the age of 12 years if she did not get any or all doses earlier. During the 3-dose series, the second dose should be obtained 4-8 weeks after the  first dose. The third dose should be obtained 24 weeks after the first dose and 16 weeks after the second dose.  Zoster vaccine. One dose is recommended for adults aged 1 years or older unless certain conditions are present.  Measles, mumps, and rubella (MMR) vaccine. Adults born before [birth year] generally are  considered immune to measles and mumps. Adults born in [birth year] or later should have 1 or more doses of MMR vaccine unless there is a contraindication to the vaccine or there is laboratory evidence of immunity to each of the three diseases. A routine second dose of MMR vaccine should be obtained at least 28 days after the first dose for students attending postsecondary schools, health care workers, or international travelers. People who received inactivated measles vaccine or an unknown type of measles vaccine during 1963-1967 should receive 2 doses of MMR vaccine. People who received inactivated mumps vaccine or an unknown type of mumps vaccine before 1979 and are at high risk for mumps infection should consider immunization with 2 doses of MMR vaccine. For females of childbearing age, rubella immunity should be determined. If there is no evidence of immunity, females who are not pregnant should be vaccinated. If there is no evidence of immunity, females who are pregnant should delay immunization until after pregnancy. Unvaccinated health care workers born before [birth year] who lack laboratory evidence of measles, mumps, or rubella immunity or laboratory confirmation of disease should consider measles and mumps immunization with 2 doses of MMR vaccine or rubella immunization with 1 dose of MMR vaccine.  Pneumococcal 13-valent conjugate (PCV13) vaccine. When indicated, a person who is uncertain of her immunization history and has no record of immunization should receive the PCV13 vaccine. An adult aged 54 years or older who has certain medical conditions and has not been previously immunized should receive 1 dose of  PCV13 vaccine. This PCV13 should be followed with a dose of pneumococcal polysaccharide (PPSV23) vaccine. The PPSV23 vaccine dose should be obtained at least 8 weeks after the dose of PCV13 vaccine. An adult aged 74 years or older who has certain medical conditions and previously received 1 or more doses of PPSV23 vaccine should receive 1 dose of PCV13. The PCV13 vaccine dose should be obtained 1 or more years after the last PPSV23 vaccine dose.  Pneumococcal polysaccharide (PPSV23) vaccine. When PCV13 is also indicated, PCV13 should be obtained first. All adults aged 36 years and older should be immunized. An adult younger than age 39 years who has certain medical conditions should be immunized. Any person who resides in a nursing home or long-term care facility should be immunized. An adult smoker should be immunized. People with an immunocompromised condition and certain other conditions should receive both PCV13 and PPSV23 vaccines. People with human immunodeficiency virus (HIV) infection should be immunized as soon as possible after diagnosis. Immunization during chemotherapy or radiation therapy should be avoided. Routine use of PPSV23 vaccine is not recommended for American Indians, [place] Natives, or people younger than 65 years unless there are medical conditions that require PPSV23 vaccine. When indicated, people who have unknown immunization and have no record of immunization should receive PPSV23 vaccine. One-time revaccination 5 years after the first dose of PPSV23 is recommended for people aged 19-64 years who have chronic kidney failure, nephrotic syndrome, asplenia, or immunocompromised conditions. People who received 1-2 doses of PPSV23 before age 46 years should receive another dose of PPSV23 vaccine at age 66 years or later if at least 5 years have passed since the previous dose. Doses of PPSV23 are not needed for people immunized with PPSV23 at or after age 74 years.  Meningococcal vaccine.  Adults with asplenia or persistent complement component deficiencies should receive 2 doses of quadrivalent meningococcal conjugate (MenACWY-D) vaccine. The doses should be obtained at least 2 months apart. Microbiologists working  with certain meningococcal bacteria, [place] recruits, people at risk during an outbreak, and people who travel to or live in countries with a high rate of meningitis should be immunized. A first-year college student up through age 82 years who is living in a residence hall should receive a dose if she did not receive a dose on or after her 16th birthday. Adults who have certain high-risk conditions should receive one or more doses of vaccine.  Hepatitis A vaccine. Adults who wish to be protected from this disease, have certain high-risk conditions, work with hepatitis A-infected animals, work in hepatitis A research labs, or travel to or work in countries with a high rate of hepatitis A should be immunized. Adults who were previously unvaccinated and who anticipate close contact with an international adoptee during the first 60 days after arrival in the [place] from a country with a high rate of hepatitis A should be immunized.  Hepatitis B vaccine. Adults who wish to be protected from this disease, have certain high-risk conditions, may be exposed to blood or other infectious body fluids, are household contacts or sex partners of hepatitis B positive people, are clients or workers in certain care facilities, or travel to or work in countries with a high rate of hepatitis B should be immunized.  Haemophilus influenzae type b (Hib) vaccine. A previously unvaccinated person with asplenia or sickle cell disease or having a scheduled splenectomy should receive 1 dose of Hib vaccine. Regardless of previous immunization, a recipient of a hematopoietic stem cell transplant should receive a 3-dose series 6-12 months after her successful transplant. Hib vaccine is not recommended for  adults with HIV infection. Preventive Services / Frequency Ages 39 to 40 years  Blood pressure check.** / Every 1 to 2 years.  Lipid and cholesterol check.** / Every 5 years beginning at age 67.  Clinical breast exam.** / Every 3 years for women in their 59s and 86s.  BRCA-related cancer risk assessment.** / For women who have family members with a BRCA-related cancer (breast, ovarian, tubal, or peritoneal cancers).  Pap test.** / Every 2 years from ages 57 through 73. Every 3 years starting at age 7 through age 14 or 8 with a history of 3 consecutive normal Pap tests.  HPV screening.** / Every 3 years from ages 40 through ages 88 to 28 with a history of 3 consecutive normal Pap tests.  Hepatitis C blood test.** / For any individual with known risks for hepatitis C.  Skin self-exam. / Monthly.  Influenza vaccine. / Every year.  Tetanus, diphtheria, and acellular pertussis (Tdap, Td) vaccine.** / Consult your health care provider. Pregnant women should receive 1 dose of Tdap vaccine during each pregnancy. 1 dose of Td every 10 years.  Varicella vaccine.** / Consult your health care provider. Pregnant females who do not have evidence of immunity should receive the first dose after pregnancy.  HPV vaccine. / 3 doses over 6 months, if 53 and younger. The vaccine is not recommended for use in pregnant females. However, pregnancy testing is not needed before receiving a dose.  Measles, mumps, rubella (MMR) vaccine.** / You need at least 1 dose of MMR if you were born in 1957 or later. You may also need a 2nd dose. For females of childbearing age, rubella immunity should be determined. If there is no evidence of immunity, females who are not pregnant should be vaccinated. If there is no evidence of immunity, females who are pregnant should delay immunization until  after pregnancy.  Pneumococcal 13-valent conjugate (PCV13) vaccine.** / Consult your health care provider.  Pneumococcal  polysaccharide (PPSV23) vaccine.** / 1 to 2 doses if you smoke cigarettes or if you have certain conditions.  Meningococcal vaccine.** / 1 dose if you are age 78 to 84 years and a Market researcher living in a residence hall, or have one of several medical conditions, you need to get vaccinated against meningococcal disease. You may also need additional booster doses.  Hepatitis A vaccine.** / Consult your health care provider.  Hepatitis B vaccine.** / Consult your health care provider.  Haemophilus influenzae type b (Hib) vaccine.** / Consult your health care provider. Ages 74 to 55 years  Blood pressure check.** / Every 1 to 2 years.  Lipid and cholesterol check.** / Every 5 years beginning at age 77 years.  Lung cancer screening. / Every year if you are aged 69-80 years and have a 30-pack-year history of smoking and currently smoke or have quit within the past 15 years. Yearly screening is stopped once you have quit smoking for at least 15 years or develop a health problem that would prevent you from having lung cancer treatment.  Clinical breast exam.** / Every year after age 99 years.  BRCA-related cancer risk assessment.** / For women who have family members with a BRCA-related cancer (breast, ovarian, tubal, or peritoneal cancers).  Mammogram.** / Every year beginning at age 76 years and continuing for as long as you are in good health. Consult with your health care provider.  Pap test.** / Every 3 years starting at age 71 years through age 33 or 41 years with a history of 3 consecutive normal Pap tests.  HPV screening.** / Every 3 years from ages 5 years through ages 69 to 5 years with a history of 3 consecutive normal Pap tests.  Fecal occult blood test (FOBT) of stool. / Every year beginning at age 47 years and continuing until age 9 years. You may not need to do this test if you get a colonoscopy every 10 years.  Flexible sigmoidoscopy or colonoscopy.** / Every 5  years for a flexible sigmoidoscopy or every 10 years for a colonoscopy beginning at age 37 years and continuing until age 44 years.  Hepatitis C blood test.** / For all people born from [birth year] through 1963 and any individual with known risks for hepatitis C.  Skin self-exam. / Monthly.  Influenza vaccine. / Every year.  Tetanus, diphtheria, and acellular pertussis (Tdap/Td) vaccine.** / Consult your health care provider. Pregnant women should receive 1 dose of Tdap vaccine during each pregnancy. 1 dose of Td every 10 years.  Varicella vaccine.** / Consult your health care provider. Pregnant females who do not have evidence of immunity should receive the first dose after pregnancy.  Zoster vaccine.** / 1 dose for adults aged 78 years or older.  Measles, mumps, rubella (MMR) vaccine.** / You need at least 1 dose of MMR if you were born in 1955 or later. You may also need a 2nd dose. For females of childbearing age, rubella immunity should be determined. If there is no evidence of immunity, females who are not pregnant should be vaccinated. If there is no evidence of immunity, females who are pregnant should delay immunization until after pregnancy.  Pneumococcal 13-valent conjugate (PCV13) vaccine.** / Consult your health care provider.  Pneumococcal polysaccharide (PPSV23) vaccine.** / 1 to 2 doses if you smoke cigarettes or if you have certain conditions.  Meningococcal vaccine.** / Consult your  health care provider.  Hepatitis A vaccine.** / Consult your health care provider.  Hepatitis B vaccine.** / Consult your health care provider.  Haemophilus influenzae type b (Hib) vaccine.** / Consult your health care provider. Ages 55 years and over  Blood pressure check.** / Every 1 to 2 years.  Lipid and cholesterol check.** / Every 5 years beginning at age 43 years.  Lung cancer screening. / Every year if you are aged 40-80 years and have a 30-pack-year history of smoking and currently  smoke or have quit within the past 15 years. Yearly screening is stopped once you have quit smoking for at least 15 years or develop a health problem that would prevent you from having lung cancer treatment.  Clinical breast exam.** / Every year after age 53 years.  BRCA-related cancer risk assessment.** / For women who have family members with a BRCA-related cancer (breast, ovarian, tubal, or peritoneal cancers).  Mammogram.** / Every year beginning at age 55 years and continuing for as long as you are in good health. Consult with your health care provider.  Pap test.** / Every 3 years starting at age 37 years through age 39 or 12 years with 3 consecutive normal Pap tests. Testing can be stopped between 65 and 70 years with 3 consecutive normal Pap tests and no abnormal Pap or HPV tests in the past 10 years.  HPV screening.** / Every 3 years from ages 105 years through ages 43 or 39 years with a history of 3 consecutive normal Pap tests. Testing can be stopped between 65 and 70 years with 3 consecutive normal Pap tests and no abnormal Pap or HPV tests in the past 10 years.  Fecal occult blood test (FOBT) of stool. / Every year beginning at age 69 years and continuing until age 30 years. You may not need to do this test if you get a colonoscopy every 10 years.  Flexible sigmoidoscopy or colonoscopy.** / Every 5 years for a flexible sigmoidoscopy or every 10 years for a colonoscopy beginning at age 12 years and continuing until age 66 years.  Hepatitis C blood test.** / For all people born from [birth year] through 1963 and any individual with known risks for hepatitis C.  Osteoporosis screening.** / A one-time screening for women ages 24 years and over and women at risk for fractures or osteoporosis.  Skin self-exam. / Monthly.  Influenza vaccine. / Every year.  Tetanus, diphtheria, and acellular pertussis (Tdap/Td) vaccine.** / 1 dose of Td every 10 years.  Varicella vaccine.** / Consult your  health care provider.  Zoster vaccine.** / 1 dose for adults aged 49 years or older.  Pneumococcal 13-valent conjugate (PCV13) vaccine.** / Consult your health care provider.  Pneumococcal polysaccharide (PPSV23) vaccine.** / 1 dose for all adults aged 19 years and older.  Meningococcal vaccine.** / Consult your health care provider.  Hepatitis A vaccine.** / Consult your health care provider.  Hepatitis B vaccine.** / Consult your health care provider.  Haemophilus influenzae type b (Hib) vaccine.** / Consult your health care provider. ** Family history and personal history of risk and conditions may change your health care provider's recommendations. Document Released: 05/03/2001 Document Revised: 07/22/2013 Document Reviewed: 08/02/2010 [facility] Patient Information 2015 [place], [place]. This information is not intended to replace advice given to you by your health care provider. Make sure you discuss any questions you have with your health care provider.

## 2014-12-03 ENCOUNTER — Telehealth: Payer: Self-pay

## 2014-12-03 DIAGNOSIS — E860 Dehydration: Secondary | ICD-10-CM

## 2014-12-03 LAB — CBC
HEMATOCRIT: 42.8 % (ref 36.0–46.0)
HEMOGLOBIN: 14.2 g/dL (ref 12.0–15.0)
MCHC: 33.1 g/dL (ref 30.0–36.0)
MCV: 89.1 fl (ref 78.0–100.0)
PLATELETS: 319 10*3/uL (ref 150.0–400.0)
RBC: 4.8 Mil/uL (ref 3.87–5.11)
RDW: 14.3 % (ref 11.5–15.5)
WBC: 8.3 10*3/uL (ref 4.0–10.5)

## 2014-12-03 LAB — COMPREHENSIVE METABOLIC PANEL
ALT: 15 U/L (ref 0–35)
AST: 22 U/L (ref 0–37)
Albumin: 4.5 g/dL (ref 3.5–5.2)
Alkaline Phosphatase: 114 U/L (ref 39–117)
BUN: 20 mg/dL (ref 6–23)
CHLORIDE: 104 meq/L (ref 96–112)
CO2: 24 mEq/L (ref 19–32)
Calcium: 10.2 mg/dL (ref 8.4–10.5)
Creatinine, Ser: 1.43 mg/dL — ABNORMAL HIGH (ref 0.40–1.20)
GFR: 37.63 mL/min — ABNORMAL LOW (ref >60.00)
GLUCOSE: 67 mg/dL — AB (ref 70–99)
POTASSIUM: 3.9 meq/L (ref 3.5–5.1)
Sodium: 141 mEq/L (ref 135–145)
Total Bilirubin: 0.5 mg/dL (ref 0.2–1.2)
Total Protein: 8.3 g/dL (ref 6.0–8.3)

## 2014-12-03 LAB — LIPID PANEL
CHOL/HDL RATIO: 4
Cholesterol: 260 mg/dL — ABNORMAL HIGH (ref 0–200)
HDL: 72.2 mg/dL (ref >39.00)
LDL CALC: 153 mg/dL — AB (ref 0–99)
NONHDL: 187.31
Triglycerides: 173 mg/dL — ABNORMAL HIGH (ref 0.0–149.0)
VLDL: 34.6 mg/dL (ref 0.0–40.0)

## 2014-12-03 LAB — URIC ACID: URIC ACID, SERUM: 5.8 mg/dL (ref 2.4–7.0)

## 2014-12-03 LAB — TSH: TSH: 2.75 u[IU]/mL (ref 0.35–4.50)

## 2014-12-03 NOTE — Telephone Encounter (Signed)
-----   Message from Bradd Canary, MD sent at 12/03/2014  3:17 PM EDT ----- Notify uric acid now normal. Cholesterol is up more. Is she taking her Pravachol? If not restart unless she had a problem with it. If she is taking it then increase to 40 mg tabs 1 tab po qhs, disp #30 with 5 rf. Minimize simple carbs and saturated fats. Also kidney functions are up some, likely partially dehydration. Increase hydration and recheck cmp in 1 month

## 2014-12-03 NOTE — Telephone Encounter (Signed)
Notified of results and verbalized understand, no questions or concerns at this time. Pt states she did stop medications and does not want to be started on a new one at this time. Will try to change her eating habits.   Please schedule pt for a lab appointment in one month

## 2014-12-04 NOTE — Telephone Encounter (Signed)
Patient scheduled lab appointment only for 01/02/15 (Fri). Please ensure orders are placed.

## 2014-12-14 ENCOUNTER — Encounter: Payer: Self-pay | Admitting: Family Medicine

## 2014-12-14 DIAGNOSIS — R519 Headache, unspecified: Secondary | ICD-10-CM

## 2014-12-14 DIAGNOSIS — R51 Headache: Secondary | ICD-10-CM

## 2014-12-14 DIAGNOSIS — Z8619 Personal history of other infectious and parasitic diseases: Secondary | ICD-10-CM | POA: Insufficient documentation

## 2014-12-14 HISTORY — DX: Headache, unspecified: R51.9

## 2014-12-14 NOTE — Assessment & Plan Note (Signed)
Encouraged heart healthy diet, increase exercise, avoid trans fats, consider a krill oil cap daily 

## 2014-12-14 NOTE — Progress Notes (Signed)
Subjective:    Patient ID: Carolyn Adams, female    DOB: [birth year], 81 y.o.   MRN: [id number]  Chief Complaint  Patient presents with  . Establish Care    HPI Patient is in today for new patient appointment. She follows with a gynecologist for GYN care but needs a new primary care doctor. Has no major acute complaints although she has been having increased right-sided headache and neck pain she describes the pain is at the occiput. She gets partial relief with Tylenol temporarily. She denies traumas or falls. No fevers, chills, congestion or other neurologic complaints are noted. Past medical history is significant for hyperlipidemia, gout, depression, hyperglycemia, reflux and hypertension. Denies CP/palp/SOB/HA/congestion/fevers/GI or GU c/o. Taking meds as prescribed  Past Medical History  Diagnosis Date  . Hyperlipidemia   . Hypertension   . Vertigo   . Chronic sinus infection     "terrible" (12/21/2011)  . Anginal pain 12/21/2011  . Chronic headaches     "but not all the time" (12/21/2011)  . Osteoarthritis     "hands" (12/21/2011)  . Allergy   . History of chicken pox   . H/O measles   . H/O mumps   . Headache 12/14/2014    Past Surgical History  Procedure Laterality Date  . Nasal septum surgery  01/24/13    [facility]  . Left heart catheterization with coronary angiogram N/A 12/22/2011    Procedure: LEFT HEART CATHETERIZATION WITH CORONARY ANGIOGRAM;  Surgeon: Robynn Pane, MD;  Location: [facility] CATH LAB;  Service: Cardiovascular;  Laterality: N/A;  . Abdominal hysterectomy  1970's    partial,     Family History  Problem Relation Age of Onset  . Cancer Sister   . Coronary artery disease Brother     s/p 3 stents  . Heart disease Brother     s/p 3 stents  . Arthritis Brother   . Hypertension Other   . Cancer Other     lung  . Diabetes Neg Hx   . Colon cancer Neg Hx   . Heart attack Son   . Heart disease Son   . Cancer Mother     breast    Social  History   Social History  . Marital Status: Widowed    Spouse Name: N/A  . Number of Children: N/A  . Years of Education: N/A   Occupational History  . 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 Smoking status: Never Smoker   . Smokeless tobacco: Never Used  . Alcohol Use: No  . Drug Use: No  . Sexual Activity: No     Comment: lives alone, follows heart healthy. 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 Not on file   Social History Narrative   Physician roster   Ophthalmologist-Dr. Elmer Picker   ENT- Dr. Annalee Genta    Outpatient Prescriptions Prior to Visit  Medication Sig Dispense Refill  . albuterol (PROVENTIL HFA;VENTOLIN HFA) 108 (90 BASE) MCG/ACT inhaler Inhale 2 puffs into the lungs every 6 (six) hours as needed for wheezing or shortness of breath. 1 Inhaler 1  . allopurinol (ZYLOPRIM) 100 MG tablet TAKE 1 TABLET EVERY DAY 90 tablet 1  . aspirin 81 MG EC tablet Take 81 mg by mouth daily.      Marland Kitchen azelastine (ASTELIN) 137 MCG/SPRAY nasal spray Place 2 sprays into the nose 2 (two) times daily. Use in each nostril as directed 30 mL  5  . azithromycin (ZITHROMAX) 250 MG tablet As directed 6 tablet 0  . citalopram (CELEXA) 10 MG tablet TAKE 1 TABLET EVERY DAY 90 tablet 1  . estropipate (OGEN) 0.75 MG tablet Take 1 tablet by mouth daily.  0  . hydrOXYzine (ATARAX/VISTARIL) 25 MG tablet Take 1-2 tablets every 8 hours as needed for itching 60 tablet 1  . nebivolol (BYSTOLIC) 5 MG tablet Take 1 tablet (5 mg total) by mouth daily. 90 tablet 1  . pravastatin (PRAVACHOL) 20 MG tablet Take 1 tablet (20 mg total) by mouth daily. 90 tablet 1  . triamcinolone cream (KENALOG) 0.1 % Apply 1 application topically 2 (two) times daily. Applied to affected areas twice daily as needed 453.6 g 1  . vitamin B-12 (CYANOCOBALAMIN) 100 MCG tablet Take 50 mcg by mouth daily.      . mupirocin ointment (BACTROBAN) 2 % Place 1 application into the nose 2 (two) times daily.  22 g 0   Facility-Administered Medications Prior to Visit  Medication Dose Route Frequency Provider Last Rate Last Dose  . ipratropium (ATROVENT) nebulizer solution 0.5 mg  0.5 mg Nebulization Once Jonita Albee, MD        Allergies  Allergen Reactions  . Methylprednisolone Palpitations    Chest pain  . Ampicillin Other (See Comments)    REACTION: Chest Pain  . Hydrocodone-Acetaminophen Nausea And Vomiting  . Olmesartan Medoxomil Nausea Only    REACTION: stomach upse    Review of Systems  Constitutional: Negative for fever, chills and malaise/fatigue.  HENT: Negative for congestion and hearing loss.   Eyes: Negative for discharge.  Respiratory: Negative for cough, sputum production and shortness of breath.   Cardiovascular: Negative for chest pain, palpitations and leg swelling.  Gastrointestinal: Negative for heartburn, nausea, vomiting, abdominal pain, diarrhea, constipation and blood in stool.  Genitourinary: Negative for dysuria, urgency, frequency and hematuria.  Musculoskeletal: Positive for myalgias, joint pain and neck pain. Negative for back pain and falls.  Skin: Negative for rash.  Neurological: Positive for headaches. Negative for dizziness, sensory change, loss of consciousness and weakness.  Endo/Heme/Allergies: Negative for environmental allergies. Does not bruise/bleed easily.  Psychiatric/Behavioral: Negative for depression and suicidal ideas. The patient is not nervous/anxious and does not have insomnia.        Objective:    Physical Exam  Constitutional: She is oriented to person, place, and time. She appears well-developed and well-nourished. No distress.  HENT:  Head: Normocephalic and atraumatic.  Eyes: Conjunctivae are normal.  Neck: Neck supple. No thyromegaly present.  Cardiovascular: Normal rate, regular rhythm and normal heart sounds.   No murmur heard. Pulmonary/Chest: Effort normal and breath sounds normal. No respiratory distress.  Abdominal:  Soft. Bowel sounds are normal. She exhibits no distension and no mass. There is no tenderness.  Musculoskeletal: She exhibits no edema.  Lymphadenopathy:    She has no cervical adenopathy.  Neurological: She is alert and oriented to person, place, and time.  Skin: Skin is warm and dry.  Psychiatric: She has a normal mood and affect. Her behavior is normal.    BP 131/82 mmHg  Pulse 93  Temp(Src) 97.5 F (36.4 C) (Oral)  Ht 5' 1.75" (1.568 m)  Wt 170 lb 9.6 oz (77.384 kg)  BMI 31.47 kg/m2  SpO2 97% Wt Readings from Last 3 Encounters:  12/02/14 170 lb 9.6 oz (77.384 kg)  05/20/14 172 lb (78.019 kg)  04/08/14 171 lb 12.8 oz (77.928 kg)     Lab Results  Component Value Date   WBC 8.3 12/02/2014   HGB 14.2 12/02/2014   HCT 42.8 12/02/2014   PLT 319.0 12/02/2014   GLUCOSE 67* 12/02/2014   CHOL 260* 12/02/2014   TRIG 173.0* 12/02/2014   HDL 72.20 12/02/2014   LDLDIRECT 145.9 09/07/2011   LDLCALC 153* 12/02/2014   ALT 15 12/02/2014   AST 22 12/02/2014   NA 141 12/02/2014   K 3.9 12/02/2014   CL 104 12/02/2014   CREATININE 1.43* 12/02/2014   BUN 20 12/02/2014   CO2 24 12/02/2014   TSH 2.75 12/02/2014   INR 1.15 12/21/2011    Lab Results  Component Value Date   TSH 2.75 12/02/2014   Lab Results  Component Value Date   WBC 8.3 12/02/2014   HGB 14.2 12/02/2014   HCT 42.8 12/02/2014   MCV 89.1 12/02/2014   PLT 319.0 12/02/2014   Lab Results  Component Value Date   NA 141 12/02/2014   K 3.9 12/02/2014   CO2 24 12/02/2014   GLUCOSE 67* 12/02/2014   BUN 20 12/02/2014   CREATININE 1.43* 12/02/2014   BILITOT 0.5 12/02/2014   ALKPHOS 114 12/02/2014   AST 22 12/02/2014   ALT 15 12/02/2014   PROT 8.3 12/02/2014   ALBUMIN 4.5 12/02/2014   CALCIUM 10.2 12/02/2014   GFR 37.63* 12/02/2014   Lab Results  Component Value Date   CHOL 260* 12/02/2014   Lab Results  Component Value Date   HDL 72.20 12/02/2014   Lab Results  Component Value Date   LDLCALC 153*  12/02/2014   Lab Results  Component Value Date   TRIG 173.0* 12/02/2014   Lab Results  Component Value Date   CHOLHDL 4 12/02/2014   No results found for: HGBA1C     Assessment & Plan:   Problem List Items Addressed This Visit    Osteoarthritis    Neck pain, maintain activity as tolerated. Try ice, alternate with heat, salon pas gel      Hyperlipidemia    Encouraged heart healthy diet, increase exercise, avoid trans fats, consider a krill oil cap daily      Headache    Encouraged increased hydration, 64 ounces of clear fluids daily. Minimize alcohol and caffeine. Eat small frequent meals with lean proteins and complex carbs. Avoid high and low blood sugars. Get adequate sleep, 7-8 hours a night. Needs exercise daily preferably in the morning.      Relevant Orders   CBC (Completed)   TSH (Completed)   Uric acid (Completed)   Lipid panel (Completed)   Comprehensive metabolic panel (Completed)   DG Cervical Spine Complete   Essential hypertension - Primary    Well controlled, no changes to meds. Encouraged heart healthy diet such as the DASH diet and exercise as tolerated.       Relevant Orders   CBC (Completed)   TSH (Completed)   Uric acid (Completed)   Lipid panel (Completed)   Comprehensive metabolic panel (Completed)   DG Cervical Spine Complete   Dermatitis    Right elbow intermittently over past 2 years      Relevant Orders   CBC (Completed)   TSH (Completed)   Uric acid (Completed)   Lipid panel (Completed)   Comprehensive metabolic panel (Completed)   DG Cervical Spine Complete    Other Visit Diagnoses    Neck pain        Relevant Orders    CBC (Completed)    TSH (Completed)    Uric  acid (Completed)    Lipid panel (Completed)    Comprehensive metabolic panel (Completed)    DG Cervical Spine Complete    Gout without tophus, unspecified cause, unspecified chronicity, unspecified site        Relevant Orders    CBC (Completed)    TSH (Completed)     Uric acid (Completed)    Lipid panel (Completed)    Comprehensive metabolic panel (Completed)    DG Cervical Spine Complete       I have discontinued Ms. Clites's mupirocin ointment. I am also having her maintain her aspirin, vitamin B-12, azelastine, albuterol, pravastatin, nebivolol, estropipate, triamcinolone cream, hydrOXYzine, azithromycin, citalopram, and allopurinol. We will continue to administer ipratropium.  No orders of the defined types were placed in this encounter.     Danise Edge, MD

## 2014-12-14 NOTE — Assessment & Plan Note (Signed)
minimize simple carbs. Increase exercise as tolerated. Request old records

## 2014-12-14 NOTE — Assessment & Plan Note (Signed)
Encouraged increased hydration, 64 ounces of clear fluids daily. Minimize alcohol and caffeine. Eat small frequent meals with lean proteins and complex carbs. Avoid high and low blood sugars. Get adequate sleep, 7-8 hours a night. Needs exercise daily preferably in the morning.  

## 2014-12-14 NOTE — Assessment & Plan Note (Signed)
Neck pain, maintain activity as tolerated. Try ice, alternate with heat, salon pas gel

## 2014-12-19 ENCOUNTER — Other Ambulatory Visit: Payer: Self-pay | Admitting: Internal Medicine

## 2014-12-24 DIAGNOSIS — I10 Essential (primary) hypertension: Secondary | ICD-10-CM | POA: Diagnosis not present

## 2014-12-24 DIAGNOSIS — E785 Hyperlipidemia, unspecified: Secondary | ICD-10-CM | POA: Diagnosis not present

## 2014-12-24 DIAGNOSIS — I2510 Atherosclerotic heart disease of native coronary artery without angina pectoris: Secondary | ICD-10-CM | POA: Diagnosis not present

## 2014-12-24 DIAGNOSIS — R7309 Other abnormal glucose: Secondary | ICD-10-CM | POA: Diagnosis not present

## 2014-12-24 DIAGNOSIS — I209 Angina pectoris, unspecified: Secondary | ICD-10-CM | POA: Diagnosis not present

## 2014-12-25 ENCOUNTER — Other Ambulatory Visit: Payer: Self-pay | Admitting: Internal Medicine

## 2015-01-02 ENCOUNTER — Other Ambulatory Visit (INDEPENDENT_AMBULATORY_CARE_PROVIDER_SITE_OTHER): Payer: Commercial Managed Care - HMO

## 2015-01-02 DIAGNOSIS — E860 Dehydration: Secondary | ICD-10-CM

## 2015-01-02 LAB — COMPREHENSIVE METABOLIC PANEL
ALBUMIN: 3.8 g/dL (ref 3.5–5.2)
ALT: 12 U/L (ref 0–35)
AST: 17 U/L (ref 0–37)
Alkaline Phosphatase: 100 U/L (ref 39–117)
BUN: 20 mg/dL (ref 6–23)
CALCIUM: 9.4 mg/dL (ref 8.4–10.5)
CHLORIDE: 108 meq/L (ref 96–112)
CO2: 25 mEq/L (ref 19–32)
CREATININE: 1.21 mg/dL — AB (ref 0.40–1.20)
GFR: 45.62 mL/min — ABNORMAL LOW (ref >60.00)
Glucose, Bld: 114 mg/dL — ABNORMAL HIGH (ref 70–99)
Potassium: 4.1 mEq/L (ref 3.5–5.1)
Sodium: 141 mEq/L (ref 135–145)
Total Bilirubin: 0.6 mg/dL (ref 0.2–1.2)
Total Protein: 7.2 g/dL (ref 6.0–8.3)

## 2015-01-19 DIAGNOSIS — Z1231 Encounter for screening mammogram for malignant neoplasm of breast: Secondary | ICD-10-CM | POA: Diagnosis not present

## 2015-01-19 DIAGNOSIS — Z01419 Encounter for gynecological examination (general) (routine) without abnormal findings: Secondary | ICD-10-CM | POA: Diagnosis not present

## 2015-02-04 DIAGNOSIS — I209 Angina pectoris, unspecified: Secondary | ICD-10-CM | POA: Diagnosis not present

## 2015-02-04 DIAGNOSIS — Z8249 Family history of ischemic heart disease and other diseases of the circulatory system: Secondary | ICD-10-CM | POA: Diagnosis not present

## 2015-02-04 DIAGNOSIS — I2510 Atherosclerotic heart disease of native coronary artery without angina pectoris: Secondary | ICD-10-CM | POA: Diagnosis not present

## 2015-02-04 DIAGNOSIS — E785 Hyperlipidemia, unspecified: Secondary | ICD-10-CM | POA: Diagnosis not present

## 2015-02-04 DIAGNOSIS — R7309 Other abnormal glucose: Secondary | ICD-10-CM | POA: Diagnosis not present

## 2015-02-04 DIAGNOSIS — I10 Essential (primary) hypertension: Secondary | ICD-10-CM | POA: Diagnosis not present

## 2015-02-17 ENCOUNTER — Ambulatory Visit (HOSPITAL_COMMUNITY)
Admission: RE | Admit: 2015-02-17 | Payer: Commercial Managed Care - HMO | Source: Ambulatory Visit | Admitting: Cardiology

## 2015-02-17 ENCOUNTER — Encounter (HOSPITAL_COMMUNITY): Admission: RE | Payer: Self-pay | Source: Ambulatory Visit

## 2015-02-17 SURGERY — LEFT HEART CATH AND CORONARY ANGIOGRAPHY
Anesthesia: LOCAL

## 2015-02-19 DIAGNOSIS — I10 Essential (primary) hypertension: Secondary | ICD-10-CM | POA: Diagnosis not present

## 2015-02-19 DIAGNOSIS — R7309 Other abnormal glucose: Secondary | ICD-10-CM | POA: Diagnosis not present

## 2015-02-19 DIAGNOSIS — Z8249 Family history of ischemic heart disease and other diseases of the circulatory system: Secondary | ICD-10-CM | POA: Diagnosis not present

## 2015-02-19 DIAGNOSIS — I208 Other forms of angina pectoris: Secondary | ICD-10-CM | POA: Diagnosis not present

## 2015-02-19 DIAGNOSIS — I2510 Atherosclerotic heart disease of native coronary artery without angina pectoris: Secondary | ICD-10-CM | POA: Diagnosis not present

## 2015-02-19 DIAGNOSIS — R9430 Abnormal result of cardiovascular function study, unspecified: Secondary | ICD-10-CM | POA: Diagnosis not present

## 2015-02-19 DIAGNOSIS — E785 Hyperlipidemia, unspecified: Secondary | ICD-10-CM | POA: Diagnosis not present

## 2015-04-03 ENCOUNTER — Ambulatory Visit: Payer: Commercial Managed Care - HMO

## 2015-05-08 ENCOUNTER — Ambulatory Visit (INDEPENDENT_AMBULATORY_CARE_PROVIDER_SITE_OTHER): Payer: Commercial Managed Care - HMO | Admitting: Family Medicine

## 2015-05-08 ENCOUNTER — Ambulatory Visit: Payer: Commercial Managed Care - HMO | Admitting: Family Medicine

## 2015-05-08 ENCOUNTER — Encounter: Payer: Self-pay | Admitting: Family Medicine

## 2015-05-08 VITALS — BP 122/80 | HR 59 | Temp 97.8°F | Ht 62.0 in | Wt 171.1 lb

## 2015-05-08 DIAGNOSIS — Z23 Encounter for immunization: Secondary | ICD-10-CM | POA: Diagnosis not present

## 2015-05-08 DIAGNOSIS — K219 Gastro-esophageal reflux disease without esophagitis: Secondary | ICD-10-CM

## 2015-05-08 DIAGNOSIS — I10 Essential (primary) hypertension: Secondary | ICD-10-CM

## 2015-05-08 DIAGNOSIS — R7309 Other abnormal glucose: Secondary | ICD-10-CM

## 2015-05-08 DIAGNOSIS — E782 Mixed hyperlipidemia: Secondary | ICD-10-CM | POA: Diagnosis not present

## 2015-05-08 DIAGNOSIS — E785 Hyperlipidemia, unspecified: Secondary | ICD-10-CM

## 2015-05-08 DIAGNOSIS — K5900 Constipation, unspecified: Secondary | ICD-10-CM

## 2015-05-08 NOTE — Patient Instructions (Signed)
Probiotic daily such as [employer] has a 10 strain cap daily Add a fiber supplement such as Benefiber once to twice  Hypertension Hypertension, commonly called high blood pressure, is when the force of blood pumping through your arteries is too strong. Your arteries are the blood vessels that carry blood from your heart throughout your body. A blood pressure reading consists of a higher number over a lower number, such as 110/72. The higher number (systolic) is the pressure inside your arteries when your heart pumps. The lower number (diastolic) is the pressure inside your arteries when your heart relaxes. Ideally you want your blood pressure below 120/80. Hypertension forces your heart to work harder to pump blood. Your arteries may become narrow or stiff. Having untreated or uncontrolled hypertension can cause heart attack, stroke, kidney disease, and other problems. RISK FACTORS Some risk factors for high blood pressure are controllable. Others are not.  Risk factors you cannot control include:   Race. You may be at higher risk if you are African American.  Age. Risk increases with age.  Gender. Men are at higher risk than women before age 53 years. After age 59, women are at higher risk than men. Risk factors you can control include:  Not getting enough exercise or physical activity.  Being overweight.  Getting too much fat, sugar, calories, or salt in your diet.  Drinking too much alcohol. SIGNS AND SYMPTOMS Hypertension does not usually cause signs or symptoms. Extremely high blood pressure (hypertensive crisis) may cause headache, anxiety, shortness of breath, and nosebleed. DIAGNOSIS To check if you have hypertension, your health care provider will measure your blood pressure while you are seated, with your arm held at the level of your heart. It should be measured at least twice using the same arm. Certain conditions can cause a difference in blood pressure between your right and  left arms. A blood pressure reading that is higher than normal on one occasion does not mean that you need treatment. If it is not clear whether you have high blood pressure, you may be asked to return on a different day to have your blood pressure checked again. Or, you may be asked to monitor your blood pressure at home for 1 or more weeks. TREATMENT Treating high blood pressure includes making lifestyle changes and possibly taking medicine. Living a healthy lifestyle can help lower high blood pressure. You may need to change some of your habits. Lifestyle changes may include:  Following the DASH diet. This diet is high in fruits, vegetables, and whole grains. It is low in salt, red meat, and added sugars.  Keep your sodium intake below 2,300 mg per day.  Getting at least 30-45 minutes of aerobic exercise at least 4 times per week.  Losing weight if necessary.  Not smoking.  Limiting alcoholic beverages.  Learning ways to reduce stress. Your health care provider may prescribe medicine if lifestyle changes are not enough to get your blood pressure under control, and if one of the following is true:  You are 25-64 years of age and your systolic blood pressure is above 140.  You are 48 years of age or older, and your systolic blood pressure is above 150.  Your diastolic blood pressure is above 90.  You have diabetes, and your systolic blood pressure is over 140 or your diastolic blood pressure is over 90.  You have kidney disease and your blood pressure is above 140/90.  You have heart disease and your blood pressure  is above 140/90. Your personal target blood pressure may vary depending on your medical conditions, your age, and other factors. HOME CARE INSTRUCTIONS  Have your blood pressure rechecked as directed by your health care provider.   Take medicines only as directed by your health care provider. Follow the directions carefully. Blood pressure medicines must be taken as  prescribed. The medicine does not work as well when you skip doses. Skipping doses also puts you at risk for problems.  Do not smoke.   Monitor your blood pressure at home as directed by your health care provider. SEEK MEDICAL CARE IF:   You think you are having a reaction to medicines taken.  You have recurrent headaches or feel dizzy.  You have swelling in your ankles.  You have trouble with your vision. SEEK IMMEDIATE MEDICAL CARE IF:  You develop a severe headache or confusion.  You have unusual weakness, numbness, or feel faint.  You have severe chest or abdominal pain.  You vomit repeatedly.  You have trouble breathing. MAKE SURE YOU:   Understand these instructions.  Will watch your condition.  Will get help right away if you are not doing well or get worse.   This information is not intended to replace advice given to you by your health care provider. Make sure you discuss any questions you have with your health care provider.   Document Released: 03/07/2005 Document Revised: 07/22/2014 Document Reviewed: 12/28/2012 Elsevier Interactive Patient Education 2016 [employer]. [place] once or twice daily

## 2015-05-08 NOTE — Progress Notes (Signed)
Pre visit review using our clinic review tool, if applicable. No additional management support is needed unless otherwise documented below in the visit note. 

## 2015-05-15 ENCOUNTER — Other Ambulatory Visit: Payer: Self-pay | Admitting: Family Medicine

## 2015-05-15 ENCOUNTER — Other Ambulatory Visit (INDEPENDENT_AMBULATORY_CARE_PROVIDER_SITE_OTHER): Payer: Commercial Managed Care - HMO

## 2015-05-15 DIAGNOSIS — K625 Hemorrhage of anus and rectum: Secondary | ICD-10-CM | POA: Diagnosis not present

## 2015-05-15 LAB — FECAL OCCULT BLOOD, IMMUNOCHEMICAL: FECAL OCCULT BLD: NEGATIVE

## 2015-05-15 NOTE — Telephone Encounter (Signed)
IFOB entered. 

## 2015-05-17 ENCOUNTER — Encounter: Payer: Self-pay | Admitting: Family Medicine

## 2015-05-17 DIAGNOSIS — K5900 Constipation, unspecified: Secondary | ICD-10-CM

## 2015-05-17 HISTORY — DX: Constipation, unspecified: K59.00

## 2015-05-17 NOTE — Assessment & Plan Note (Signed)
hgba1c acceptable, minimize simple carbs. Increase exercise as tolerated.  

## 2015-05-17 NOTE — Assessment & Plan Note (Signed)
Avoid offending foods, start probiotics. Do not eat large meals in late evening and consider raising head of bed.  

## 2015-05-17 NOTE — Assessment & Plan Note (Signed)
Well controlled, no changes to meds. Encouraged heart healthy diet such as the DASH diet and exercise as tolerated.  °

## 2015-05-17 NOTE — Assessment & Plan Note (Signed)
Encouraged increased hydration and fiber in diet. Daily probiotics. If bowels not moving can use MOM 2 tbls po in 4 oz of warm prune juice by mouth every 2-3 days. If no results then repeat in 4 hours with  Dulcolax suppository pr, may repeat again in 4 more hours as needed. Seek care if symptoms worsen. Consider daily Miralax and/or Dulcolax if symptoms persist.  

## 2015-05-17 NOTE — Progress Notes (Signed)
Patient ID: Marilynn Ekstein, female   DOB: 1933/05/11, 82 y.o.   MRN: [id number]   Subjective:    Patient ID: Hayden Pedro, female    DOB: 11/30/1933, 82 y.o.   MRN: [id number]  Chief Complaint  Patient presents with  . Follow-up    bowel movement problems    HPI Patient is in today for follow-up. Overall feeling well although he is noting smaller harder bowel movements for about a month. He denies any abdominal pain. He denies any blood or melanotic stool. No anorexia or fevers. No other recent illness or acute concerns. Denies CP/palp/SOB/HA/congestion/fevers/GI or GU c/o. Taking meds as prescribed  Past Medical History  Diagnosis Date  . Hyperlipidemia   . Hypertension   . Vertigo   . Chronic sinus infection     "terrible" (12/21/2011)  . Anginal pain (HCC) 12/21/2011  . Chronic headaches     "but not all the time" (12/21/2011)  . Osteoarthritis     "hands" (12/21/2011)  . Allergy   . History of chicken pox   . H/O measles   . H/O mumps   . Headache 12/14/2014  . Constipation 05/17/2015    Past Surgical History  Procedure Laterality Date  . Nasal septum surgery  01/24/13    [facility]  . Left heart catheterization with coronary angiogram N/A 12/22/2011    Procedure: LEFT HEART CATHETERIZATION WITH CORONARY ANGIOGRAM;  Surgeon: Robynn Pane, MD;  Location: [facility] CATH LAB;  Service: Cardiovascular;  Laterality: N/A;  . Abdominal hysterectomy  1970's    partial,     Family History  Problem Relation Age of Onset  . Cancer Sister   . Coronary artery disease Brother     s/p 3 stents  . Heart disease Brother     s/p 3 stents  . Arthritis Brother   . Hypertension Other   . Cancer Other     lung  . Diabetes Neg Hx   . Colon cancer Neg Hx   . Heart attack Son   . Heart disease Son   . Cancer Mother     breast    Social History   Social History  . Marital Status: Widowed    Spouse Name: N/A  . Number of Children: N/A  . Years of Education: N/A    Occupational History  . 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 Smoking status: Never Smoker   . Smokeless tobacco: Never Used  . Alcohol Use: No  . Drug Use: No  . Sexual Activity: No     Comment: lives alone, follows heart healthy. 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 Not on file   Social History Narrative   Physician roster   Ophthalmologist-Dr. Elmer Picker   ENT- Dr. Annalee Genta    Outpatient Prescriptions Prior to Visit  Medication Sig Dispense Refill  . allopurinol (ZYLOPRIM) 100 MG tablet TAKE 1 TABLET EVERY DAY 90 tablet 1  . aspirin 81 MG EC tablet Take 81 mg by mouth daily.      Marland Kitchen azelastine (ASTELIN) 137 MCG/SPRAY nasal spray Place 2 sprays into the nose 2 (two) times daily. Use in each nostril as directed 30 mL 5  . BYSTOLIC 5 MG tablet take 1 tablet by mouth once daily 90 tablet 0  . citalopram (CELEXA) 10 MG tablet TAKE 1 TABLET EVERY DAY 90 tablet 1  . pravastatin (PRAVACHOL) 20 MG tablet Take 1 tablet (20 mg  total) by mouth daily. 90 tablet 1  . vitamin B-12 (CYANOCOBALAMIN) 100 MCG tablet Take 50 mcg by mouth daily.      Marland Kitchen estropipate (OGEN) 0.75 MG tablet Take 1 tablet by mouth daily. Reported on 05/08/2015  0  . hydrOXYzine (ATARAX/VISTARIL) 25 MG tablet Take 1-2 tablets every 8 hours as needed for itching (Patient not taking: Reported on 05/08/2015) 60 tablet 1  . triamcinolone cream (KENALOG) 0.1 % Apply 1 application topically 2 (two) times daily. Applied to affected areas twice daily as needed (Patient not taking: Reported on 05/08/2015) 453.6 g 1  . albuterol (PROVENTIL HFA;VENTOLIN HFA) 108 (90 BASE) MCG/ACT inhaler Inhale 2 puffs into the lungs every 6 (six) hours as needed for wheezing or shortness of breath. 1 Inhaler 1  . azithromycin (ZITHROMAX) 250 MG tablet As directed 6 tablet 0   Facility-Administered Medications Prior to Visit  Medication Dose Route Frequency Provider Last Rate Last Dose  . ipratropium  (ATROVENT) nebulizer solution 0.5 mg  0.5 mg Nebulization Once Jonita Albee, MD        Allergies  Allergen Reactions  . Methylprednisolone Palpitations    Chest pain  . Ampicillin Other (See Comments)    REACTION: Chest Pain  . Hydrocodone-Acetaminophen Nausea And Vomiting  . Olmesartan Medoxomil Nausea Only    REACTION: stomach upse    Review of Systems  Constitutional: Negative for fever and malaise/fatigue.  HENT: Negative for congestion.   Eyes: Negative for discharge.  Respiratory: Negative for shortness of breath.   Cardiovascular: Negative for chest pain, palpitations and leg swelling.  Gastrointestinal: Negative for nausea and abdominal pain.  Genitourinary: Negative for dysuria.  Musculoskeletal: Negative for falls.  Skin: Negative for rash.  Neurological: Negative for loss of consciousness and headaches.  Endo/Heme/Allergies: Negative for environmental allergies.  Psychiatric/Behavioral: Negative for depression. The patient is not nervous/anxious.        Objective:    Physical Exam  Constitutional: She is oriented to person, place, and time. She appears well-developed and well-nourished. No distress.  HENT:  Head: Normocephalic and atraumatic.  Nose: Nose normal.  Eyes: Right eye exhibits no discharge. Left eye exhibits no discharge.  Neck: Normal range of motion. Neck supple.  Cardiovascular: Normal rate and regular rhythm.   Pulmonary/Chest: Effort normal and breath sounds normal.  Abdominal: Soft. Bowel sounds are normal. There is no tenderness.  Musculoskeletal: She exhibits no edema.  Neurological: She is alert and oriented to person, place, and time.  Skin: Skin is warm and dry.  Psychiatric: She has a normal mood and affect.  Nursing note and vitals reviewed.   BP 122/80 mmHg  Pulse 59  Temp(Src) 97.8 F (36.6 C) (Oral)  Ht  (1.575 m)  Wt 171 lb 2 oz (77.622 kg)  BMI 31.29 kg/m2  SpO2 97% Wt Readings from Last 3 Encounters:  05/08/15  171 lb 2 oz (77.622 kg)  12/02/14 170 lb 9.6 oz (77.384 kg)  05/20/14 172 lb (78.019 kg)     Lab Results  Component Value Date   WBC 8.3 12/02/2014   HGB 14.2 12/02/2014   HCT 42.8 12/02/2014   PLT 319.0 12/02/2014   GLUCOSE 114* 01/02/2015   CHOL 260* 12/02/2014   TRIG 173.0* 12/02/2014   HDL 72.20 12/02/2014   LDLDIRECT 145.9 09/07/2011   LDLCALC 153* 12/02/2014   ALT 12 01/02/2015   AST 17 01/02/2015   NA 141 01/02/2015   K 4.1 01/02/2015   CL 108 01/02/2015  CREATININE 1.21* 01/02/2015   BUN 20 01/02/2015   CO2 25 01/02/2015   TSH 2.75 12/02/2014   INR 1.15 12/21/2011    Lab Results  Component Value Date   TSH 2.75 12/02/2014   Lab Results  Component Value Date   WBC 8.3 12/02/2014   HGB 14.2 12/02/2014   HCT 42.8 12/02/2014   MCV 89.1 12/02/2014   PLT 319.0 12/02/2014   Lab Results  Component Value Date   NA 141 01/02/2015   K 4.1 01/02/2015   CO2 25 01/02/2015   GLUCOSE 114* 01/02/2015   BUN 20 01/02/2015   CREATININE 1.21* 01/02/2015   BILITOT 0.6 01/02/2015   ALKPHOS 100 01/02/2015   AST 17 01/02/2015   ALT 12 01/02/2015   PROT 7.2 01/02/2015   ALBUMIN 3.8 01/02/2015   CALCIUM 9.4 01/02/2015   GFR 45.62* 01/02/2015   Lab Results  Component Value Date   CHOL 260* 12/02/2014   Lab Results  Component Value Date   HDL 72.20 12/02/2014   Lab Results  Component Value Date   LDLCALC 153* 12/02/2014   Lab Results  Component Value Date   TRIG 173.0* 12/02/2014   Lab Results  Component Value Date   CHOLHDL 4 12/02/2014   No results found for: HGBA1C     Assessment & Plan:   Problem List Items Addressed This Visit    Constipation    Encouraged increased hydration and fiber in diet. Daily probiotics. If bowels not moving can use MOM 2 tbls po in 4 oz of warm prune juice by mouth every 2-3 days. If no results then repeat in 4 hours with  Dulcolax suppository pr, may repeat again in 4 more hours as needed. Seek care if symptoms worsen.  Consider daily Miralax and/or Dulcolax if symptoms persist.       Essential hypertension - Primary    Well controlled, no changes to meds. Encouraged heart healthy diet such as the DASH diet and exercise as tolerated.       Relevant Orders   CBC   TSH   Lipid panel   Comprehensive metabolic panel   GERD (gastroesophageal reflux disease)    Avoid offending foods, start probiotics. Do not eat large meals in late evening and consider raising head of bed.       HYPERGLYCEMIA    hgba1c acceptable, minimize simple carbs. Increase exercise as tolerated.       Hyperlipidemia    Encouraged heart healthy diet, increase exercise, avoid trans fats, consider a krill oil cap daily       Other Visit Diagnoses    Hyperlipidemia, mixed        Relevant Orders    CBC    TSH    Lipid panel    Comprehensive metabolic panel    Need for vaccination with 13-polyvalent pneumococcal conjugate vaccine        Relevant Orders    Pneumococcal conjugate vaccine 13-valent (Completed)       I have discontinued Ms. Turski's albuterol and azithromycin. I am also having her maintain her aspirin, vitamin B-12, azelastine, pravastatin, estropipate, triamcinolone cream, hydrOXYzine, allopurinol, citalopram, BYSTOLIC, and clopidogrel. We will continue to administer ipratropium.  Meds ordered this encounter  Medications  . clopidogrel (PLAVIX) 75 MG tablet    Sig: Take 1 tablet by mouth daily.    Refill:  0     Danise Edge, MD

## 2015-05-17 NOTE — Assessment & Plan Note (Signed)
Encouraged heart healthy diet, increase exercise, avoid trans fats, consider a krill oil cap daily 

## 2015-05-25 DIAGNOSIS — I10 Essential (primary) hypertension: Secondary | ICD-10-CM | POA: Diagnosis not present

## 2015-05-25 DIAGNOSIS — E785 Hyperlipidemia, unspecified: Secondary | ICD-10-CM | POA: Diagnosis not present

## 2015-05-25 DIAGNOSIS — I25110 Atherosclerotic heart disease of native coronary artery with unstable angina pectoris: Secondary | ICD-10-CM | POA: Diagnosis not present

## 2015-05-25 DIAGNOSIS — R7309 Other abnormal glucose: Secondary | ICD-10-CM | POA: Diagnosis not present

## 2015-05-25 DIAGNOSIS — Z8249 Family history of ischemic heart disease and other diseases of the circulatory system: Secondary | ICD-10-CM | POA: Diagnosis not present

## 2015-05-26 ENCOUNTER — Ambulatory Visit: Payer: Commercial Managed Care - HMO | Admitting: Family Medicine

## 2015-05-27 ENCOUNTER — Other Ambulatory Visit: Payer: Self-pay | Admitting: Internal Medicine

## 2015-05-27 ENCOUNTER — Other Ambulatory Visit: Payer: Self-pay | Admitting: Family Medicine

## 2015-05-27 NOTE — Telephone Encounter (Signed)
Rx sent to the pharmacy by e-script.//AB/CMA 

## 2015-06-01 ENCOUNTER — Telehealth: Payer: Self-pay

## 2015-06-01 DIAGNOSIS — R9430 Abnormal result of cardiovascular function study, unspecified: Secondary | ICD-10-CM | POA: Diagnosis not present

## 2015-06-01 DIAGNOSIS — I200 Unstable angina: Secondary | ICD-10-CM | POA: Diagnosis not present

## 2015-06-01 NOTE — Telephone Encounter (Signed)
Rescheduled CPE for another time.

## 2015-06-02 ENCOUNTER — Ambulatory Visit (HOSPITAL_COMMUNITY)
Admission: RE | Admit: 2015-06-02 | Discharge: 2015-06-02 | Disposition: A | Discharge status: Home / Self Care | Payer: Commercial Managed Care - HMO | Source: Ambulatory Visit | Attending: Cardiology | Admitting: Cardiology

## 2015-06-02 ENCOUNTER — Encounter (HOSPITAL_COMMUNITY)
Admission: RE | Disposition: A | Discharge status: Home / Self Care | Payer: Self-pay | Source: Ambulatory Visit | Attending: Cardiology

## 2015-06-02 ENCOUNTER — Encounter: Payer: Commercial Managed Care - HMO | Admitting: Family Medicine

## 2015-06-02 DIAGNOSIS — I2510 Atherosclerotic heart disease of native coronary artery without angina pectoris: Secondary | ICD-10-CM | POA: Diagnosis not present

## 2015-06-02 DIAGNOSIS — R7309 Other abnormal glucose: Secondary | ICD-10-CM | POA: Diagnosis not present

## 2015-06-02 DIAGNOSIS — I10 Essential (primary) hypertension: Secondary | ICD-10-CM | POA: Diagnosis not present

## 2015-06-02 DIAGNOSIS — I25119 Atherosclerotic heart disease of native coronary artery with unspecified angina pectoris: Secondary | ICD-10-CM | POA: Diagnosis not present

## 2015-06-02 DIAGNOSIS — R9430 Abnormal result of cardiovascular function study, unspecified: Secondary | ICD-10-CM | POA: Diagnosis not present

## 2015-06-02 DIAGNOSIS — I209 Angina pectoris, unspecified: Secondary | ICD-10-CM | POA: Diagnosis not present

## 2015-06-02 HISTORY — PX: CARDIAC CATHETERIZATION: SHX172

## 2015-06-02 SURGERY — LEFT HEART CATH AND CORONARY ANGIOGRAPHY

## 2015-06-02 MED ORDER — LIDOCAINE HCL (PF) 1 % IJ SOLN
INTRAMUSCULAR | Status: DC | PRN
  Administered 2015-06-02: 22 mL via INTRADERMAL

## 2015-06-02 MED ORDER — IOHEXOL 350 MG/ML SOLN
INTRAVENOUS | Status: DC | PRN
  Administered 2015-06-02: 85 mL via INTRA_ARTERIAL

## 2015-06-02 MED ORDER — SODIUM CHLORIDE 0.9% FLUSH: 3.0000 mL | Freq: Two times a day (BID) | INTRAVENOUS | Status: DC

## 2015-06-02 MED ORDER — SODIUM CHLORIDE 0.9 % IV SOLN: 250.0000 mL | INTRAVENOUS | Status: DC | PRN

## 2015-06-02 MED ORDER — ASPIRIN 81 MG PO CHEW
CHEWABLE_TABLET | ORAL | Status: AC
  Administered 2015-06-02: 81 mg via ORAL
  Filled 2015-06-02: qty 1

## 2015-06-02 MED ORDER — MIDAZOLAM HCL 2 MG/2ML IJ SOLN
INTRAMUSCULAR | Status: AC
  Filled 2015-06-02: qty 2

## 2015-06-02 MED ORDER — MIDAZOLAM HCL 2 MG/2ML IJ SOLN
INTRAMUSCULAR | Status: DC | PRN
  Administered 2015-06-02: 1 mg via INTRAVENOUS

## 2015-06-02 MED ORDER — SODIUM CHLORIDE 0.9 % WEIGHT BASED INFUSION: 1.0000 mL/kg/h | INTRAVENOUS | Status: DC

## 2015-06-02 MED ORDER — SODIUM CHLORIDE 0.9 % WEIGHT BASED INFUSION
3.0000 mL/kg/h | INTRAVENOUS | Status: DC
Start: 2015-06-03 — End: 2015-06-02
  Administered 2015-06-02: 3 mL/kg/h via INTRAVENOUS

## 2015-06-02 MED ORDER — FENTANYL CITRATE (PF) 100 MCG/2ML IJ SOLN
INTRAMUSCULAR | Status: AC
  Filled 2015-06-02: qty 2

## 2015-06-02 MED ORDER — HEPARIN (PORCINE) IN NACL 2-0.9 UNIT/ML-% IJ SOLN: INTRAMUSCULAR | Status: DC | PRN

## 2015-06-02 MED ORDER — SODIUM CHLORIDE 0.9% FLUSH: 3.0000 mL | INTRAVENOUS | Status: DC | PRN

## 2015-06-02 MED ORDER — LIDOCAINE HCL (PF) 1 % IJ SOLN
INTRAMUSCULAR | Status: AC
  Filled 2015-06-02: qty 30

## 2015-06-02 MED ORDER — FENTANYL CITRATE (PF) 100 MCG/2ML IJ SOLN
INTRAMUSCULAR | Status: DC | PRN
  Administered 2015-06-02: 25 ug via INTRAVENOUS

## 2015-06-02 MED ORDER — HEPARIN (PORCINE) IN NACL 2-0.9 UNIT/ML-% IJ SOLN
INTRAMUSCULAR | Status: DC | PRN
  Administered 2015-06-02: 1000 mL

## 2015-06-02 MED ORDER — SODIUM CHLORIDE 0.9 % WEIGHT BASED INFUSION: 3.0000 mL/kg/h | INTRAVENOUS | Status: AC

## 2015-06-02 MED ORDER — NITROGLYCERIN 1 MG/10 ML FOR IR/CATH LAB
INTRA_ARTERIAL | Status: AC
  Filled 2015-06-02: qty 10

## 2015-06-02 MED ORDER — ONDANSETRON HCL 4 MG/2ML IJ SOLN: 4.0000 mg | Freq: Four times a day (QID) | INTRAMUSCULAR | Status: DC | PRN

## 2015-06-02 MED ORDER — ASPIRIN 81 MG PO CHEW
81.0000 mg | CHEWABLE_TABLET | ORAL | Status: AC
  Administered 2015-06-02: 81 mg via ORAL

## 2015-06-02 MED ORDER — HEPARIN (PORCINE) IN NACL 2-0.9 UNIT/ML-% IJ SOLN
INTRAMUSCULAR | Status: AC
  Filled 2015-06-02: qty 1000

## 2015-06-02 SURGICAL SUPPLY — 7 items
CATH INFINITI 5FR MULTPACK ANG (CATHETERS) ×2 IMPLANT
KIT HEART LEFT (KITS) ×3 IMPLANT
PACK CARDIAC CATHETERIZATION (CUSTOM PROCEDURE TRAY) ×3 IMPLANT
SHEATH PINNACLE 5F 10CM (SHEATH) ×2 IMPLANT
SYR MEDRAD MARK V 150ML (SYRINGE) ×3 IMPLANT
TRANSDUCER W/STOPCOCK (MISCELLANEOUS) ×3 IMPLANT
WIRE EMERALD 3MM-J .035X150CM (WIRE) ×2 IMPLANT

## 2015-06-02 NOTE — Discharge Instructions (Signed)
Angiogram, Care After °Refer to this sheet in the next few weeks. These instructions provide you with information about caring for yourself after your procedure. Your health care provider may also give you more specific instructions. Your treatment has been planned according to current medical practices, but problems sometimes occur. Call your health care provider if you have any problems or questions after your procedure. °WHAT TO EXPECT AFTER THE PROCEDURE °After your procedure, it is typical to have the following: °· Bruising at the catheter insertion site that usually fades within 1-2 weeks. °· Blood collecting in the tissue (hematoma) that may be painful to the touch. It should usually decrease in size and tenderness within 1-2 weeks. °HOME CARE INSTRUCTIONS °· Take medicines only as directed by your health care provider. °· You may shower 24-48 hours after the procedure or as directed by your health care provider. Remove the bandage (dressing) and gently wash the site with plain soap and water. Pat the area dry with a clean towel. Do not rub the site, because this may cause bleeding. °· Do not take baths, swim, or use a hot tub until your health care provider approves. °· Check your insertion site every day for redness, swelling, or drainage. °· Do not apply powder or lotion to the site. °· Do not lift over 10 lb (4.5 kg) for 5 days after your procedure or as directed by your health care provider. °· Ask your health care provider when it is okay to: °¨ Return to work or school. °¨ Resume usual physical activities or sports. °¨ Resume sexual activity. °· Do not drive home if you are discharged the same day as the procedure. Have someone else drive you. °· You may drive 24 hours after the procedure unless otherwise instructed by your health care provider. °· Do not operate machinery or power tools for 24 hours after the procedure or as directed by your health care provider. °· If your procedure was done as an  outpatient procedure, which means that you went home the same day as your procedure, a responsible adult should be with you for the first 24 hours after you arrive home. °· Keep all follow-up visits as directed by your health care provider. This is important. °SEEK MEDICAL CARE IF: °· You have a fever. °· You have chills. °· You have increased bleeding from the catheter insertion site. Hold pressure on the site. °SEEK IMMEDIATE MEDICAL CARE IF: °· You have unusual pain at the catheter insertion site. °· You have redness, warmth, or swelling at the catheter insertion site. °· You have drainage (other than a small amount of blood on the dressing) from the catheter insertion site. °· The catheter insertion site is bleeding, and the bleeding does not stop after 30 minutes of holding steady pressure on the site. °· The area near or just beyond the catheter insertion site becomes pale, cool, tingly, or numb. °  °This information is not intended to replace advice given to you by your health care provider. Make sure you discuss any questions you have with your health care provider. °  °Document Released: 09/23/2004 Document Revised: 03/28/2014 Document Reviewed: 08/08/2012 °Elsevier Interactive Patient Education ©2016 [employer] ° °

## 2015-06-02 NOTE — Progress Notes (Signed)
845fr sheath pulled from rfa and direct pressure maintained for 15 minutes resulting in complete hemostasis it site.  Tegaderm applied to site and distal pulses easily palpatable.  Post instructions given and voiced back.

## 2015-06-02 NOTE — Interval H&P Note (Signed)
Cath Lab Visit (complete for each Cath Lab visit)  Clinical Evaluation Leading to the Procedure:   ACS: No.  Non-ACS:    Anginal Classification: CCS III  Anti-ischemic medical therapy: Maximal Therapy (2 or more classes of medications)  Non-Invasive Test Results: Intermediate-risk stress test findings: cardiac mortality 1-3%/year  Prior CABG: No previous CABG      History and Physical Interval Note:  06/02/2015 11:59 AM  Carolyn Adams  has presented today for surgery, with the diagnosis of abnormal stress test  The various methods of treatment have been discussed with the patient and family. After consideration of risks, benefits and other options for treatment, the patient has consented to  Procedure(s): Left Heart Cath and Coronary Angiography (N/A) as a surgical intervention .  The patient's history has been reviewed, patient examined, no change in status, stable for surgery.  I have reviewed the patient's chart and labs.  Questions were answered to the patient's satisfaction.     Rinaldo CloudHarwani, Carolyn Adams

## 2015-06-02 NOTE — H&P (Signed)
Printed H&P in the chart needs to be scanned 

## 2015-06-03 ENCOUNTER — Encounter (HOSPITAL_COMMUNITY): Payer: Self-pay | Admitting: Cardiology

## 2015-06-12 DIAGNOSIS — I10 Essential (primary) hypertension: Secondary | ICD-10-CM | POA: Diagnosis not present

## 2015-06-12 DIAGNOSIS — R7309 Other abnormal glucose: Secondary | ICD-10-CM | POA: Diagnosis not present

## 2015-06-12 DIAGNOSIS — E785 Hyperlipidemia, unspecified: Secondary | ICD-10-CM | POA: Diagnosis not present

## 2015-06-12 DIAGNOSIS — I2510 Atherosclerotic heart disease of native coronary artery without angina pectoris: Secondary | ICD-10-CM | POA: Diagnosis not present

## 2015-06-18 ENCOUNTER — Other Ambulatory Visit: Payer: Self-pay | Admitting: Internal Medicine

## 2015-10-09 DIAGNOSIS — I2510 Atherosclerotic heart disease of native coronary artery without angina pectoris: Secondary | ICD-10-CM | POA: Diagnosis not present

## 2015-10-09 DIAGNOSIS — R7309 Other abnormal glucose: Secondary | ICD-10-CM | POA: Diagnosis not present

## 2015-10-09 DIAGNOSIS — M1000 Idiopathic gout, unspecified site: Secondary | ICD-10-CM | POA: Diagnosis not present

## 2015-10-09 DIAGNOSIS — Z8249 Family history of ischemic heart disease and other diseases of the circulatory system: Secondary | ICD-10-CM | POA: Diagnosis not present

## 2015-10-09 DIAGNOSIS — I10 Essential (primary) hypertension: Secondary | ICD-10-CM | POA: Diagnosis not present

## 2015-10-09 DIAGNOSIS — E785 Hyperlipidemia, unspecified: Secondary | ICD-10-CM | POA: Diagnosis not present

## 2015-10-21 ENCOUNTER — Other Ambulatory Visit: Payer: Self-pay | Admitting: Family Medicine

## 2015-10-21 NOTE — Telephone Encounter (Signed)
Rx request to pharmacy/SLS  

## 2015-11-10 ENCOUNTER — Ambulatory Visit (INDEPENDENT_AMBULATORY_CARE_PROVIDER_SITE_OTHER): Payer: Commercial Managed Care - HMO | Admitting: Family Medicine

## 2015-11-10 ENCOUNTER — Encounter: Payer: Self-pay | Admitting: Family Medicine

## 2015-11-10 VITALS — BP 112/58 | HR 65 | Temp 97.8°F | Resp 16 | Ht 63.0 in | Wt 175.8 lb

## 2015-11-10 DIAGNOSIS — E785 Hyperlipidemia, unspecified: Secondary | ICD-10-CM

## 2015-11-10 DIAGNOSIS — Z0000 Encounter for general adult medical examination without abnormal findings: Secondary | ICD-10-CM

## 2015-11-10 DIAGNOSIS — R7309 Other abnormal glucose: Secondary | ICD-10-CM | POA: Diagnosis not present

## 2015-11-10 DIAGNOSIS — I10 Essential (primary) hypertension: Secondary | ICD-10-CM

## 2015-11-10 DIAGNOSIS — H547 Unspecified visual loss: Secondary | ICD-10-CM

## 2015-11-10 DIAGNOSIS — K219 Gastro-esophageal reflux disease without esophagitis: Secondary | ICD-10-CM

## 2015-11-10 DIAGNOSIS — Z780 Asymptomatic menopausal state: Secondary | ICD-10-CM

## 2015-11-10 NOTE — Progress Notes (Signed)
Pre visit review using our clinic review tool, if applicable. No additional management support is needed unless otherwise documented below in the visit note. 

## 2015-11-10 NOTE — Patient Instructions (Signed)
Preventive Care for Adults, Female A healthy lifestyle and preventive care can promote health and wellness. Preventive health guidelines for women include the following key practices.  A routine yearly physical is a good way to check with your health care provider about your health and preventive screening. It is a chance to share any concerns and updates on your health and to receive a thorough exam.  Visit your dentist for a routine exam and preventive care every 6 months. Brush your teeth twice a day and floss once a day. Good oral hygiene prevents tooth decay and gum disease.  The frequency of eye exams is based on your age, health, family medical history, use of contact lenses, and other factors. Follow your health care provider's recommendations for frequency of eye exams.  Eat a healthy diet. Foods like vegetables, fruits, whole grains, low-fat dairy products, and lean protein foods contain the nutrients you need without too many calories. Decrease your intake of foods high in solid fats, added sugars, and salt. Eat the right amount of calories for you.Get information about a proper diet from your health care provider, if necessary.  Regular physical exercise is one of the most important things you can do for your health. Most adults should get at least 150 minutes of moderate-intensity exercise (any activity that increases your heart rate and causes you to sweat) each week. In addition, most adults need muscle-strengthening exercises on 2 or more days a week.  Maintain a healthy weight. The body mass index (BMI) is a screening tool to identify possible weight problems. It provides an estimate of body fat based on height and weight. Your health care provider can find your BMI and can help you achieve or maintain a healthy weight.For adults 20 years and older:  A BMI below 18.5 is considered underweight.  A BMI of 18.5 to 24.9 is normal.  A BMI of 25 to 29.9 is considered overweight.  A  BMI of 30 and above is considered obese.  Maintain normal blood lipids and cholesterol levels by exercising and minimizing your intake of saturated fat. Eat a balanced diet with plenty of fruit and vegetables. Blood tests for lipids and cholesterol should begin at age 45 and be repeated every 5 years. If your lipid or cholesterol levels are high, you are over 50, or you are at high risk for heart disease, you may need your cholesterol levels checked more frequently.Ongoing high lipid and cholesterol levels should be treated with medicines if diet and exercise are not working.  If you smoke, find out from your health care provider how to quit. If you do not use tobacco, do not start.  Lung cancer screening is recommended for adults aged 45-80 years who are at high risk for developing lung cancer because of a history of smoking. A yearly low-dose CT scan of the lungs is recommended for people who have at least a 30-pack-year history of smoking and are a current smoker or have quit within the past 15 years. A pack year of smoking is smoking an average of 1 pack of cigarettes a day for 1 year (for example: 1 pack a day for 30 years or 2 packs a day for 15 years). Yearly screening should continue until the smoker has stopped smoking for at least 15 years. Yearly screening should be stopped for people who develop a health problem that would prevent them from having lung cancer treatment.  If you are pregnant, do not drink alcohol. If you are  breastfeeding, be very cautious about drinking alcohol. If you are not pregnant and choose to drink alcohol, do not have more than 1 drink per day. One drink is considered to be 12 ounces (355 mL) of beer, 5 ounces (148 mL) of wine, or 1.5 ounces (44 mL) of liquor.  Avoid use of street drugs. Do not share needles with anyone. Ask for help if you need support or instructions about stopping the use of drugs.  High blood pressure causes heart disease and increases the risk  of stroke. Your blood pressure should be checked at least every 1 to 2 years. Ongoing high blood pressure should be treated with medicines if weight loss and exercise do not work.  If you are 55-79 years old, ask your health care provider if you should take aspirin to prevent strokes.  Diabetes screening is done by taking a blood sample to check your blood glucose level after you have not eaten for a certain period of time (fasting). If you are not overweight and you do not have risk factors for diabetes, you should be screened once every 3 years starting at age 45. If you are overweight or obese and you are 40-70 years of age, you should be screened for diabetes every year as part of your cardiovascular risk assessment.  Breast cancer screening is essential preventive care for women. You should practice "breast self-awareness." This means understanding the normal appearance and feel of your breasts and may include breast self-examination. Any changes detected, no matter how small, should be reported to a health care provider. Women in their 20s and 30s should have a clinical breast exam (CBE) by a health care provider as part of a regular health exam every 1 to 3 years. After age 40, women should have a CBE every year. Starting at age 40, women should consider having a mammogram (breast X-ray test) every year. Women who have a family history of breast cancer should talk to their health care provider about genetic screening. Women at a high risk of breast cancer should talk to their health care providers about having an MRI and a mammogram every year.  Breast cancer gene (BRCA)-related cancer risk assessment is recommended for women who have family members with BRCA-related cancers. BRCA-related cancers include breast, ovarian, tubal, and peritoneal cancers. Having family members with these cancers may be associated with an increased risk for harmful changes (mutations) in the breast cancer genes BRCA1 and  BRCA2. Results of the assessment will determine the need for genetic counseling and BRCA1 and BRCA2 testing.  Your health care provider may recommend that you be screened regularly for cancer of the pelvic organs (ovaries, uterus, and vagina). This screening involves a pelvic examination, including checking for microscopic changes to the surface of your cervix (Pap test). You may be encouraged to have this screening done every 3 years, beginning at age 21.  For women ages 30-65, health care providers may recommend pelvic exams and Pap testing every 3 years, or they may recommend the Pap and pelvic exam, combined with testing for human papilloma virus (HPV), every 5 years. Some types of HPV increase your risk of cervical cancer. Testing for HPV may also be done on women of any age with unclear Pap test results.  Other health care providers may not recommend any screening for nonpregnant women who are considered low risk for pelvic cancer and who do not have symptoms. Ask your health care provider if a screening pelvic exam is right for   you.  If you have had past treatment for cervical cancer or a condition that could lead to cancer, you need Pap tests and screening for cancer for at least 20 years after your treatment. If Pap tests have been discontinued, your risk factors (such as having a new sexual partner) need to be reassessed to determine if screening should resume. Some women have medical problems that increase the chance of getting cervical cancer. In these cases, your health care provider may recommend more frequent screening and Pap tests.  Colorectal cancer can be detected and often prevented. Most routine colorectal cancer screening begins at the age of 50 years and continues through age 75 years. However, your health care provider may recommend screening at an earlier age if you have risk factors for colon cancer. On a yearly basis, your health care provider may provide home test kits to check  for hidden blood in the stool. Use of a small camera at the end of a tube, to directly examine the colon (sigmoidoscopy or colonoscopy), can detect the earliest forms of colorectal cancer. Talk to your health care provider about this at age 50, when routine screening begins. Direct exam of the colon should be repeated every 5-10 years through age 75 years, unless early forms of precancerous polyps or small growths are found.  People who are at an increased risk for hepatitis B should be screened for this virus. You are considered at high risk for hepatitis B if:  You were born in a country where hepatitis B occurs often. Talk with your health care provider about which countries are considered high risk.  Your parents were born in a high-risk country and you have not received a shot to protect against hepatitis B (hepatitis B vaccine).  You have HIV or AIDS.  You use needles to inject street drugs.  You live with, or have sex with, someone who has hepatitis B.  You get hemodialysis treatment.  You take certain medicines for conditions like cancer, organ transplantation, and autoimmune conditions.  Hepatitis C blood testing is recommended for all people born from 1945 through 1965 and any individual with known risks for hepatitis C.  Practice safe sex. Use condoms and avoid high-risk sexual practices to reduce the spread of sexually transmitted infections (STIs). STIs include gonorrhea, chlamydia, syphilis, trichomonas, herpes, HPV, and human immunodeficiency virus (HIV). Herpes, HIV, and HPV are viral illnesses that have no cure. They can result in disability, cancer, and death.  You should be screened for sexually transmitted illnesses (STIs) including gonorrhea and chlamydia if:  You are sexually active and are younger than 24 years.  You are older than 24 years and your health care provider tells you that you are at risk for this type of infection.  Your sexual activity has changed  since you were last screened and you are at an increased risk for chlamydia or gonorrhea. Ask your health care provider if you are at risk.  If you are at risk of being infected with HIV, it is recommended that you take a prescription medicine daily to prevent HIV infection. This is called preexposure prophylaxis (PrEP). You are considered at risk if:  You are sexually active and do not regularly use condoms or know the HIV status of your partner(s).  You take drugs by injection.  You are sexually active with a partner who has HIV.  Talk with your health care provider about whether you are at high risk of being infected with HIV. If   you choose to begin PrEP, you should first be tested for HIV. You should then be tested every 3 months for as long as you are taking PrEP.  Osteoporosis is a disease in which the bones lose minerals and strength with aging. This can result in serious bone fractures or breaks. The risk of osteoporosis can be identified using a bone density scan. Women ages 67 years and over and women at risk for fractures or osteoporosis should discuss screening with their health care providers. Ask your health care provider whether you should take a calcium supplement or vitamin D to reduce the rate of osteoporosis.  Menopause can be associated with physical symptoms and risks. Hormone replacement therapy is available to decrease symptoms and risks. You should talk to your health care provider about whether hormone replacement therapy is right for you.  Use sunscreen. Apply sunscreen liberally and repeatedly throughout the day. You should seek shade when your shadow is shorter than you. Protect yourself by wearing long sleeves, pants, a wide-brimmed hat, and sunglasses year round, whenever you are outdoors.  Once a month, do a whole body skin exam, using a mirror to look at the skin on your back. Tell your health care provider of new moles, moles that have irregular borders, moles that  are larger than a pencil eraser, or moles that have changed in shape or color.  Stay current with required vaccines (immunizations).  Influenza vaccine. All adults should be immunized every year.  Tetanus, diphtheria, and acellular pertussis (Td, Tdap) vaccine. Pregnant women should receive 1 dose of Tdap vaccine during each pregnancy. The dose should be obtained regardless of the length of time since the last dose. Immunization is preferred during the 27th-36th week of gestation. An adult who has not previously received Tdap or who does not know her vaccine status should receive 1 dose of Tdap. This initial dose should be followed by tetanus and diphtheria toxoids (Td) booster doses every 10 years. Adults with an unknown or incomplete history of completing a 3-dose immunization series with Td-containing vaccines should begin or complete a primary immunization series including a Tdap dose. Adults should receive a Td booster every 10 years.  Varicella vaccine. An adult without evidence of immunity to varicella should receive 2 doses or a second dose if she has previously received 1 dose. Pregnant females who do not have evidence of immunity should receive the first dose after pregnancy. This first dose should be obtained before leaving the health care facility. The second dose should be obtained 4-8 weeks after the first dose.  Human papillomavirus (HPV) vaccine. Females aged 13-26 years who have not received the vaccine previously should obtain the 3-dose series. The vaccine is not recommended for use in pregnant females. However, pregnancy testing is not needed before receiving a dose. If a female is found to be pregnant after receiving a dose, no treatment is needed. In that case, the remaining doses should be delayed until after the pregnancy. Immunization is recommended for any person with an immunocompromised condition through the age of 61 years if she did not get any or all doses earlier. During the  3-dose series, the second dose should be obtained 4-8 weeks after the first dose. The third dose should be obtained 24 weeks after the first dose and 16 weeks after the second dose.  Zoster vaccine. One dose is recommended for adults aged 30 years or older unless certain conditions are present.  Measles, mumps, and rubella (MMR) vaccine. Adults born  before 1957 generally are considered immune to measles and mumps. Adults born in 1957 or later should have 1 or more doses of MMR vaccine unless there is a contraindication to the vaccine or there is laboratory evidence of immunity to each of the three diseases. A routine second dose of MMR vaccine should be obtained at least 28 days after the first dose for students attending postsecondary schools, health care workers, or international travelers. People who received inactivated measles vaccine or an unknown type of measles vaccine during 1963-1967 should receive 2 doses of MMR vaccine. People who received inactivated mumps vaccine or an unknown type of mumps vaccine before 1979 and are at high risk for mumps infection should consider immunization with 2 doses of MMR vaccine. For females of childbearing age, rubella immunity should be determined. If there is no evidence of immunity, females who are not pregnant should be vaccinated. If there is no evidence of immunity, females who are pregnant should delay immunization until after pregnancy. Unvaccinated health care workers born before 1957 who lack laboratory evidence of measles, mumps, or rubella immunity or laboratory confirmation of disease should consider measles and mumps immunization with 2 doses of MMR vaccine or rubella immunization with 1 dose of MMR vaccine.  Pneumococcal 13-valent conjugate (PCV13) vaccine. When indicated, a person who is uncertain of his immunization history and has no record of immunization should receive the PCV13 vaccine. All adults 65 years of age and older should receive this  vaccine. An adult aged 19 years or older who has certain medical conditions and has not been previously immunized should receive 1 dose of PCV13 vaccine. This PCV13 should be followed with a dose of pneumococcal polysaccharide (PPSV23) vaccine. Adults who are at high risk for pneumococcal disease should obtain the PPSV23 vaccine at least 8 weeks after the dose of PCV13 vaccine. Adults older than 80 years of age who have normal immune system function should obtain the PPSV23 vaccine dose at least 1 year after the dose of PCV13 vaccine.  Pneumococcal polysaccharide (PPSV23) vaccine. When PCV13 is also indicated, PCV13 should be obtained first. All adults aged 65 years and older should be immunized. An adult younger than age 65 years who has certain medical conditions should be immunized. Any person who resides in a nursing home or long-term care facility should be immunized. An adult smoker should be immunized. People with an immunocompromised condition and certain other conditions should receive both PCV13 and PPSV23 vaccines. People with human immunodeficiency virus (HIV) infection should be immunized as soon as possible after diagnosis. Immunization during chemotherapy or radiation therapy should be avoided. Routine use of PPSV23 vaccine is not recommended for American Indians, [place] Natives, or people younger than 65 years unless there are medical conditions that require PPSV23 vaccine. When indicated, people who have unknown immunization and have no record of immunization should receive PPSV23 vaccine. One-time revaccination 5 years after the first dose of PPSV23 is recommended for people aged 19-64 years who have chronic kidney failure, nephrotic syndrome, asplenia, or immunocompromised conditions. People who received 1-2 doses of PPSV23 before age 65 years should receive another dose of PPSV23 vaccine at age 65 years or later if at least 5 years have passed since the previous dose. Doses of PPSV23 are not  needed for people immunized with PPSV23 at or after age 65 years.  Meningococcal vaccine. Adults with asplenia or persistent complement component deficiencies should receive 2 doses of quadrivalent meningococcal conjugate (MenACWY-D) vaccine. The doses should be obtained   at least 2 months apart. Microbiologists working with certain meningococcal bacteria, [place] recruits, people at risk during an outbreak, and people who travel to or live in countries with a high rate of meningitis should be immunized. A first-year college student up through age 34 years who is living in a residence hall should receive a dose if she did not receive a dose on or after her 16th birthday. Adults who have certain high-risk conditions should receive one or more doses of vaccine.  Hepatitis A vaccine. Adults who wish to be protected from this disease, have certain high-risk conditions, work with hepatitis A-infected animals, work in hepatitis A research labs, or travel to or work in countries with a high rate of hepatitis A should be immunized. Adults who were previously unvaccinated and who anticipate close contact with an international adoptee during the first 60 days after arrival in the [place] from a country with a high rate of hepatitis A should be immunized.  Hepatitis B vaccine. Adults who wish to be protected from this disease, have certain high-risk conditions, may be exposed to blood or other infectious body fluids, are household contacts or sex partners of hepatitis B positive people, are clients or workers in certain care facilities, or travel to or work in countries with a high rate of hepatitis B should be immunized.  Haemophilus influenzae type b (Hib) vaccine. A previously unvaccinated person with asplenia or sickle cell disease or having a scheduled splenectomy should receive 1 dose of Hib vaccine. Regardless of previous immunization, a recipient of a hematopoietic stem cell transplant should receive a  3-dose series 6-12 months after her successful transplant. Hib vaccine is not recommended for adults with HIV infection. Preventive Services / Frequency Ages 35 to 4 years  Blood pressure check.** / Every 3-5 years.  Lipid and cholesterol check.** / Every 5 years beginning at age 60.  Clinical breast exam.** / Every 3 years for women in their 71s and 10s.  BRCA-related cancer risk assessment.** / For women who have family members with a BRCA-related cancer (breast, ovarian, tubal, or peritoneal cancers).  Pap test.** / Every 2 years from ages 76 through 26. Every 3 years starting at age 61 through age 76 or 93 with a history of 3 consecutive normal Pap tests.  HPV screening.** / Every 3 years from ages 37 through ages 60 to 51 with a history of 3 consecutive normal Pap tests.  Hepatitis C blood test.** / For any individual with known risks for hepatitis C.  Skin self-exam. / Monthly.  Influenza vaccine. / Every year.  Tetanus, diphtheria, and acellular pertussis (Tdap, Td) vaccine.** / Consult your health care provider. Pregnant women should receive 1 dose of Tdap vaccine during each pregnancy. 1 dose of Td every 10 years.  Varicella vaccine.** / Consult your health care provider. Pregnant females who do not have evidence of immunity should receive the first dose after pregnancy.  HPV vaccine. / 3 doses over 6 months, if 93 and younger. The vaccine is not recommended for use in pregnant females. However, pregnancy testing is not needed before receiving a dose.  Measles, mumps, rubella (MMR) vaccine.** / You need at least 1 dose of MMR if you were born in 1957 or later. You may also need a 2nd dose. For females of childbearing age, rubella immunity should be determined. If there is no evidence of immunity, females who are not pregnant should be vaccinated. If there is no evidence of immunity, females who are  pregnant should delay immunization until after pregnancy.  Pneumococcal  13-valent conjugate (PCV13) vaccine.** / Consult your health care provider.  Pneumococcal polysaccharide (PPSV23) vaccine.** / 1 to 2 doses if you smoke cigarettes or if you have certain conditions.  Meningococcal vaccine.** / 1 dose if you are age 68 to 8 years and a Market researcher living in a residence hall, or have one of several medical conditions, you need to get vaccinated against meningococcal disease. You may also need additional booster doses.  Hepatitis A vaccine.** / Consult your health care provider.  Hepatitis B vaccine.** / Consult your health care provider.  Haemophilus influenzae type b (Hib) vaccine.** / Consult your health care provider. Ages 7 to 53 years  Blood pressure check.** / Every year.  Lipid and cholesterol check.** / Every 5 years beginning at age 25 years.  Lung cancer screening. / Every year if you are aged 11-80 years and have a 30-pack-year history of smoking and currently smoke or have quit within the past 15 years. Yearly screening is stopped once you have quit smoking for at least 15 years or develop a health problem that would prevent you from having lung cancer treatment.  Clinical breast exam.** / Every year after age 48 years.  BRCA-related cancer risk assessment.** / For women who have family members with a BRCA-related cancer (breast, ovarian, tubal, or peritoneal cancers).  Mammogram.** / Every year beginning at age 41 years and continuing for as long as you are in good health. Consult with your health care provider.  Pap test.** / Every 3 years starting at age 65 years through age 37 or 70 years with a history of 3 consecutive normal Pap tests.  HPV screening.** / Every 3 years from ages 72 years through ages 60 to 40 years with a history of 3 consecutive normal Pap tests.  Fecal occult blood test (FOBT) of stool. / Every year beginning at age 21 years and continuing until age 5 years. You may not need to do this test if you get  a colonoscopy every 10 years.  Flexible sigmoidoscopy or colonoscopy.** / Every 5 years for a flexible sigmoidoscopy or every 10 years for a colonoscopy beginning at age 35 years and continuing until age 48 years.  Hepatitis C blood test.** / For all people born from [birth year] through 1965 and any individual with known risks for hepatitis C.  Skin self-exam. / Monthly.  Influenza vaccine. / Every year.  Tetanus, diphtheria, and acellular pertussis (Tdap/Td) vaccine.** / Consult your health care provider. Pregnant women should receive 1 dose of Tdap vaccine during each pregnancy. 1 dose of Td every 10 years.  Varicella vaccine.** / Consult your health care provider. Pregnant females who do not have evidence of immunity should receive the first dose after pregnancy.  Zoster vaccine.** / 1 dose for adults aged 30 years or older.  Measles, mumps, rubella (MMR) vaccine.** / You need at least 1 dose of MMR if you were born in 1957 or later. You may also need a second dose. For females of childbearing age, rubella immunity should be determined. If there is no evidence of immunity, females who are not pregnant should be vaccinated. If there is no evidence of immunity, females who are pregnant should delay immunization until after pregnancy.  Pneumococcal 13-valent conjugate (PCV13) vaccine.** / Consult your health care provider.  Pneumococcal polysaccharide (PPSV23) vaccine.** / 1 to 2 doses if you smoke cigarettes or if you have certain conditions.  Meningococcal vaccine.** /  Consult your health care provider.  Hepatitis A vaccine.** / Consult your health care provider.  Hepatitis B vaccine.** / Consult your health care provider.  Haemophilus influenzae type b (Hib) vaccine.** / Consult your health care provider. Ages 64 years and over  Blood pressure check.** / Every year.  Lipid and cholesterol check.** / Every 5 years beginning at age 23 years.  Lung cancer screening. / Every year if you  are aged 16-80 years and have a 30-pack-year history of smoking and currently smoke or have quit within the past 15 years. Yearly screening is stopped once you have quit smoking for at least 15 years or develop a health problem that would prevent you from having lung cancer treatment.  Clinical breast exam.** / Every year after age 74 years.  BRCA-related cancer risk assessment.** / For women who have family members with a BRCA-related cancer (breast, ovarian, tubal, or peritoneal cancers).  Mammogram.** / Every year beginning at age 44 years and continuing for as long as you are in good health. Consult with your health care provider.  Pap test.** / Every 3 years starting at age 58 years through age 22 or 39 years with 3 consecutive normal Pap tests. Testing can be stopped between 65 and 70 years with 3 consecutive normal Pap tests and no abnormal Pap or HPV tests in the past 10 years.  HPV screening.** / Every 3 years from ages 64 years through ages 70 or 61 years with a history of 3 consecutive normal Pap tests. Testing can be stopped between 65 and 70 years with 3 consecutive normal Pap tests and no abnormal Pap or HPV tests in the past 10 years.  Fecal occult blood test (FOBT) of stool. / Every year beginning at age 40 years and continuing until age 27 years. You may not need to do this test if you get a colonoscopy every 10 years.  Flexible sigmoidoscopy or colonoscopy.** / Every 5 years for a flexible sigmoidoscopy or every 10 years for a colonoscopy beginning at age 7 years and continuing until age 32 years.  Hepatitis C blood test.** / For all people born from [birth year] through 1965 and any individual with known risks for hepatitis C.  Osteoporosis screening.** / A one-time screening for women ages 30 years and over and women at risk for fractures or osteoporosis.  Skin self-exam. / Monthly.  Influenza vaccine. / Every year.  Tetanus, diphtheria, and acellular pertussis (Tdap/Td)  vaccine.** / 1 dose of Td every 10 years.  Varicella vaccine.** / Consult your health care provider.  Zoster vaccine.** / 1 dose for adults aged 35 years or older.  Pneumococcal 13-valent conjugate (PCV13) vaccine.** / Consult your health care provider.  Pneumococcal polysaccharide (PPSV23) vaccine.** / 1 dose for all adults aged 46 years and older.  Meningococcal vaccine.** / Consult your health care provider.  Hepatitis A vaccine.** / Consult your health care provider.  Hepatitis B vaccine.** / Consult your health care provider.  Haemophilus influenzae type b (Hib) vaccine.** / Consult your health care provider. ** Family history and personal history of risk and conditions may change your health care provider's recommendations.   This information is not intended to replace advice given to you by your health care provider. Make sure you discuss any questions you have with your health care provider.   Document Released: 05/03/2001 Document Revised: 03/28/2014 Document Reviewed: 08/02/2010 Elsevier Interactive Patient Education [employer].

## 2015-11-11 LAB — LIPID PANEL
CHOL/HDL RATIO: 4
Cholesterol: 250 mg/dL — ABNORMAL HIGH (ref 0–200)
HDL: 56.6 mg/dL (ref >39.00)
NONHDL: 193.69
Triglycerides: 273 mg/dL — ABNORMAL HIGH (ref 0.0–149.0)
VLDL: 54.6 mg/dL — ABNORMAL HIGH (ref 0.0–40.0)

## 2015-11-11 LAB — COMPREHENSIVE METABOLIC PANEL
ALT: 17 U/L (ref 0–35)
AST: 20 U/L (ref 0–37)
Albumin: 4.2 g/dL (ref 3.5–5.2)
Alkaline Phosphatase: 98 U/L (ref 39–117)
BUN: 17 mg/dL (ref 6–23)
CALCIUM: 9.4 mg/dL (ref 8.4–10.5)
CHLORIDE: 106 meq/L (ref 96–112)
CO2: 28 meq/L (ref 19–32)
CREATININE: 1.06 mg/dL (ref 0.40–1.20)
GFR: 53.03 mL/min — ABNORMAL LOW (ref >60.00)
Glucose, Bld: 72 mg/dL (ref 70–99)
Potassium: 3.9 mEq/L (ref 3.5–5.1)
SODIUM: 142 meq/L (ref 135–145)
Total Bilirubin: 0.4 mg/dL (ref 0.2–1.2)
Total Protein: 7.5 g/dL (ref 6.0–8.3)

## 2015-11-11 LAB — CBC
HEMATOCRIT: 38.3 % (ref 36.0–46.0)
HEMOGLOBIN: 13.1 g/dL (ref 12.0–15.0)
MCHC: 34.2 g/dL (ref 30.0–36.0)
MCV: 87.7 fl (ref 78.0–100.0)
PLATELETS: 286 10*3/uL (ref 150.0–400.0)
RBC: 4.37 Mil/uL (ref 3.87–5.11)
RDW: 14.2 % (ref 11.5–15.5)
WBC: 6.9 10*3/uL (ref 4.0–10.5)

## 2015-11-11 LAB — HEMOGLOBIN A1C: Hgb A1c MFr Bld: 6.2 % (ref 4.6–6.5)

## 2015-11-11 LAB — TSH: TSH: 3.1 u[IU]/mL (ref 0.35–4.50)

## 2015-11-11 LAB — LDL CHOLESTEROL, DIRECT: LDL DIRECT: 146 mg/dL

## 2015-11-11 MED ORDER — PRAVASTATIN SODIUM 40 MG PO TABS: 40.0000 mg | ORAL_TABLET | Freq: Every day | ORAL | 0 refills | Status: DC

## 2015-11-17 ENCOUNTER — Other Ambulatory Visit (HOSPITAL_BASED_OUTPATIENT_CLINIC_OR_DEPARTMENT_OTHER): Payer: Commercial Managed Care - HMO

## 2015-11-17 DIAGNOSIS — H52223 Regular astigmatism, bilateral: Secondary | ICD-10-CM | POA: Diagnosis not present

## 2015-11-17 DIAGNOSIS — H5213 Myopia, bilateral: Secondary | ICD-10-CM | POA: Diagnosis not present

## 2015-11-17 DIAGNOSIS — H26493 Other secondary cataract, bilateral: Secondary | ICD-10-CM | POA: Diagnosis not present

## 2015-11-17 DIAGNOSIS — H35033 Hypertensive retinopathy, bilateral: Secondary | ICD-10-CM | POA: Diagnosis not present

## 2015-11-17 DIAGNOSIS — H524 Presbyopia: Secondary | ICD-10-CM | POA: Diagnosis not present

## 2015-11-23 DIAGNOSIS — H547 Unspecified visual loss: Secondary | ICD-10-CM | POA: Insufficient documentation

## 2015-11-23 DIAGNOSIS — Z780 Asymptomatic menopausal state: Secondary | ICD-10-CM | POA: Insufficient documentation

## 2015-11-23 NOTE — Assessment & Plan Note (Signed)
Well controlled, no changes to meds. Encouraged heart healthy diet such as the DASH diet and exercise as tolerated.  °

## 2015-11-23 NOTE — Assessment & Plan Note (Signed)
Tolerating statin, encouraged heart healthy diet, avoid trans fats, minimize simple carbs and saturated fats. Increase exercise as tolerated 

## 2015-11-23 NOTE — Assessment & Plan Note (Signed)
Referred to opthamology for further consideration 

## 2015-11-23 NOTE — Progress Notes (Signed)
Patient ID: Carolyn Adams, female   DOB: Nov 17, 1933, 82 y.o.   MRN: [id number]   Subjective:    Patient ID: Carolyn Adams, female    DOB: 1933/09/29, 82 y.o.   MRN: [id number]  Chief Complaint  Patient presents with  . Annual Exam    HPI Patient is in today for annual preventative exam and follow up on medical concerns such as hyperlipidemia, hypertension and hyperglycemia. Feels well today. No polyuria or polydipsia. Notes some decreased visual acuity. Is trying to eat well and stay active. Denies CP/palp/SOB/HA/congestion/fevers/GI or GU c/o. Taking meds as prescribed   Past Medical History:  Diagnosis Date  . Allergy   . Anginal pain (HCC) 12/21/2011  . Chronic headaches    "but not all the time" (12/21/2011)  . Chronic sinus infection    "terrible" (12/21/2011)  . Constipation 05/17/2015  . H/O measles   . H/O mumps   . Headache 12/14/2014  . History of chicken pox   . Hyperlipidemia   . Hypertension   . Osteoarthritis    "hands" (12/21/2011)  . Vertigo     Past Surgical History:  Procedure Laterality Date  . ABDOMINAL HYSTERECTOMY  1970's   partial,   . CARDIAC CATHETERIZATION N/A 06/02/2015   Procedure: Left Heart Cath and Coronary Angiography;  Surgeon: Rinaldo Cloud, MD;  Location: [facility]VASIVE CV LAB;  Service: Cardiovascular;  Laterality: N/A;  . LEFT HEART CATHETERIZATION WITH CORONARY ANGIOGRAM N/A 12/22/2011   Procedure: LEFT HEART CATHETERIZATION WITH CORONARY ANGIOGRAM;  Surgeon: Robynn Pane, MD;  Location: [facility] CATH [facility];  Service: Cardiovascular;  Laterality: N/A;  . NASAL SEPTUM SURGERY  01/24/13   [facility]    Family History  Problem Relation Age of Onset  . Cancer Sister   . Coronary artery disease Brother     s/p 3 stents  . Heart disease Brother     s/p 3 stents  . Arthritis Brother   . Heart attack Son   . Heart disease Son   . Cancer Mother     breast  . Hypertension Other   . Cancer Other     lung  . Diabetes Neg Hx   . Colon  cancer Neg Hx     Social History   Social History  . Marital status: Widowed    Spouse name: N/A  . Number of children: N/A  . Years of education: N/A   Occupational History  . 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 Smoking status: Never Smoker  . Smokeless tobacco: Never Used  . Alcohol use No  . Drug use: No  . Sexual activity: No     Comment: lives alone, follows heart healthy. 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 Not on file   Social History Narrative   Physician roster   Ophthalmologist-Dr. Elmer Picker   ENT- Dr. Annalee Genta    Outpatient Medications Prior to Visit  Medication Sig Dispense Refill  . allopurinol (ZYLOPRIM) 100 MG tablet TAKE 1 TABLET EVERY DAY 90 tablet 0  . aspirin 81 MG EC tablet Take 81 mg by mouth daily.      Marland Kitchen azelastine (ASTELIN) 137 MCG/SPRAY nasal spray Place 2 sprays into the nose 2 (two) times daily. Use in each nostril as directed 30 mL 5  . citalopram (CELEXA) 10 MG tablet TAKE 1 TABLET EVERY DAY 90 tablet 1  . clopidogrel (PLAVIX) 75 MG tablet Take 1 tablet by  mouth daily.  0  . estropipate (OGEN) 0.75 MG tablet Take 1 tablet by mouth daily. Reported on 05/08/2015  0  . vitamin B-12 (CYANOCOBALAMIN) 50 MCG tablet Take 50 mcg by mouth daily.    . pravastatin (PRAVACHOL) 20 MG tablet Take 1 tablet (20 mg total) by mouth daily. 90 tablet 1  . BYSTOLIC 5 MG tablet take 1 tablet by mouth once daily (Patient not taking: Reported on 11/10/2015) 90 tablet 0   Facility-Administered Medications Prior to Visit  Medication Dose Route Frequency Provider Last Rate Last Dose  . ipratropium (ATROVENT) nebulizer solution 0.5 mg  0.5 mg Nebulization Once Jonita Albeehris W Guest, MD        Allergies  Allergen Reactions  . Methylprednisolone Palpitations    Chest pain  . Ampicillin Other (See Comments)    REACTION: Chest Pain  . Hydrocodone-Acetaminophen Nausea And Vomiting  . Olmesartan Medoxomil Nausea Only     REACTION: stomach upse    Review of Systems  Constitutional: Negative for chills, fever and malaise/fatigue.  HENT: Negative for congestion and hearing loss.   Eyes: Negative for discharge.  Respiratory: Negative for cough, sputum production and shortness of breath.   Cardiovascular: Negative for chest pain, palpitations and leg swelling.  Gastrointestinal: Negative for abdominal pain, blood in stool, constipation, diarrhea, heartburn, nausea and vomiting.  Genitourinary: Negative for dysuria, frequency, hematuria and urgency.  Musculoskeletal: Negative for back pain, falls and myalgias.  Skin: Negative for rash.  Neurological: Negative for dizziness, sensory change, loss of consciousness, weakness and headaches.  Endo/Heme/Allergies: Negative for environmental allergies. Does not bruise/bleed easily.  Psychiatric/Behavioral: Negative for depression and suicidal ideas. The patient is not nervous/anxious and does not have insomnia.        Objective:    Physical Exam  Constitutional: She is oriented to person, place, and time. She appears well-developed and well-nourished. No distress.  HENT:  Head: Normocephalic and atraumatic.  Eyes: Conjunctivae are normal.  Neck: Neck supple. No thyromegaly present.  Cardiovascular: Normal rate, regular rhythm and normal heart sounds.   No murmur heard. Pulmonary/Chest: Effort normal and breath sounds normal. No respiratory distress.  Abdominal: Soft. Bowel sounds are normal. She exhibits no distension and no mass. There is no tenderness.  Musculoskeletal: She exhibits no edema.  Lymphadenopathy:    She has no cervical adenopathy.  Neurological: She is alert and oriented to person, place, and time.  Skin: Skin is warm and dry.  Psychiatric: She has a normal mood and affect. Her behavior is normal.    BP (!) 112/58 (BP Location: Left Arm, Patient Position: Sitting, Cuff Size: Large)   Pulse 65   Temp 97.8 F (36.6 C) (Oral)   Resp 16   Ht  5\' 3"  (1.6 m)   Wt 175 lb 12.8 oz (79.7 kg)   SpO2 97%   BMI 31.14 kg/m  Wt Readings from Last 3 Encounters:  11/10/15 175 lb 12.8 oz (79.7 kg)  06/02/15 160 lb (72.6 kg)  05/08/15 171 lb 2 oz (77.6 kg)     Lab Results  Component Value Date   WBC 6.9 11/10/2015   HGB 13.1 11/10/2015   HCT 38.3 11/10/2015   PLT 286.0 11/10/2015   GLUCOSE 72 11/10/2015   CHOL 250 (H) 11/10/2015   TRIG 273.0 (H) 11/10/2015   HDL 56.60 11/10/2015   LDLDIRECT 146.0 11/10/2015   LDLCALC 153 (H) 12/02/2014   ALT 17 11/10/2015   AST 20 11/10/2015   NA 142 11/10/2015  K 3.9 11/10/2015   CL 106 11/10/2015   CREATININE 1.06 11/10/2015   BUN 17 11/10/2015   CO2 28 11/10/2015   TSH 3.10 11/10/2015   INR 1.15 12/21/2011   HGBA1C 6.2 11/10/2015    Lab Results  Component Value Date   TSH 3.10 11/10/2015   Lab Results  Component Value Date   WBC 6.9 11/10/2015   HGB 13.1 11/10/2015   HCT 38.3 11/10/2015   MCV 87.7 11/10/2015   PLT 286.0 11/10/2015   Lab Results  Component Value Date   NA 142 11/10/2015   K 3.9 11/10/2015   CO2 28 11/10/2015   GLUCOSE 72 11/10/2015   BUN 17 11/10/2015   CREATININE 1.06 11/10/2015   BILITOT 0.4 11/10/2015   ALKPHOS 98 11/10/2015   AST 20 11/10/2015   ALT 17 11/10/2015   PROT 7.5 11/10/2015   ALBUMIN 4.2 11/10/2015   CALCIUM 9.4 11/10/2015   GFR 53.03 (L) 11/10/2015   Lab Results  Component Value Date   CHOL 250 (H) 11/10/2015   Lab Results  Component Value Date   HDL 56.60 11/10/2015   Lab Results  Component Value Date   LDLCALC 153 (H) 12/02/2014   Lab Results  Component Value Date   TRIG 273.0 (H) 11/10/2015   Lab Results  Component Value Date   CHOLHDL 4 11/10/2015   Lab Results  Component Value Date   HGBA1C 6.2 11/10/2015       Assessment & Plan:   Problem List Items Addressed This Visit    Hyperlipidemia    Tolerating statin, encouraged heart healthy diet, avoid trans fats, minimize simple carbs and saturated fats.  Increase exercise as tolerated      Relevant Medications   amLODipine (NORVASC) 2.5 MG tablet   pravastatin (PRAVACHOL) 40 MG tablet   Other Relevant Orders   Lipid panel (Completed)   Essential hypertension - Primary    Well controlled, no changes to meds. Encouraged heart healthy diet such as the DASH diet and exercise as tolerated.       Relevant Medications   amLODipine (NORVASC) 2.5 MG tablet   pravastatin (PRAVACHOL) 40 MG tablet   Other Relevant Orders   CBC (Completed)   TSH (Completed)   Comprehensive metabolic panel (Completed)   HYPERGLYCEMIA     minimize simple carbs. Increase exercise as tolerated.       Relevant Orders   Hemoglobin A1c (Completed)   Ambulatory referral to Ophthalmology   GERD (gastroesophageal reflux disease)   Preventative health care    Patient encouraged to maintain heart healthy diet, regular exercise, adequate sleep. Consider daily probiotics. Take medications as prescribed. Given and reviewed copy of ACP documents from [facility] Secretary of State and encouraged to complete and return      Postmenopausal estrogen deficiency   Relevant Orders   DG Bone Density   Decreased visual acuity    Referred to opthamology for further consideration      Relevant Orders   Ambulatory referral to Ophthalmology    Other Visit Diagnoses   None.     I am having Ms. Cuthbert maintain her aspirin, azelastine, estropipate, BYSTOLIC, clopidogrel, citalopram, vitamin B-12, allopurinol, amLODipine, and pravastatin. We will continue to administer ipratropium.  Meds ordered this encounter  Medications  . amLODipine (NORVASC) 2.5 MG tablet    Sig: Take 2.5 mg by mouth daily.     Refill:  0  . DISCONTD: pravastatin (PRAVACHOL) 40 MG tablet    Sig: Take 1 tablet (  40 mg total) by mouth daily.    Dispense:  15 tablet    Refill:  0    Dosage changed on 11/11/2015, awaiting mail order  . pravastatin (PRAVACHOL) 40 MG tablet    Sig: Take 1 tablet (40 mg total) by  mouth daily.    Dispense:  90 tablet    Refill:  0    Dosage changed on 11/11/2015     Danise Edge, MD

## 2015-11-23 NOTE — Assessment & Plan Note (Signed)
minimize simple carbs. Increase exercise as tolerated.  

## 2015-11-23 NOTE — Assessment & Plan Note (Signed)
Patient encouraged to maintain heart healthy diet, regular exercise, adequate sleep. Consider daily probiotics. Take medications as prescribed. Given and reviewed copy of ACP documents from [place] Secretary of State and encouraged to complete and return 

## 2015-12-07 ENCOUNTER — Other Ambulatory Visit: Payer: Self-pay | Admitting: Family Medicine

## 2015-12-23 ENCOUNTER — Other Ambulatory Visit: Payer: Self-pay | Admitting: Family Medicine

## 2015-12-23 NOTE — Telephone Encounter (Signed)
Medication Detail    Disp Refills Start End   allopurinol (ZYLOPRIM) 100 MG tablet 90 tablet 0 10/21/2015    Sig: TAKE 1 TABLET EVERY DAY   E-Prescribing Status: Receipt confirmed by pharmacy (10/21/2015 4:40 PM EDT)   Pharmacy   [facility] [place] - [phone]   Rx should not be needed until 01/20/16 according to [facility] on 10/21/15 for 90-day supply; Please Advise on refills/SLS 10/04

## 2016-01-08 DIAGNOSIS — M109 Gout, unspecified: Secondary | ICD-10-CM | POA: Diagnosis not present

## 2016-01-08 DIAGNOSIS — I10 Essential (primary) hypertension: Secondary | ICD-10-CM | POA: Diagnosis not present

## 2016-01-08 DIAGNOSIS — I2510 Atherosclerotic heart disease of native coronary artery without angina pectoris: Secondary | ICD-10-CM | POA: Diagnosis not present

## 2016-01-08 DIAGNOSIS — E785 Hyperlipidemia, unspecified: Secondary | ICD-10-CM | POA: Diagnosis not present

## 2016-01-08 DIAGNOSIS — R7309 Other abnormal glucose: Secondary | ICD-10-CM | POA: Diagnosis not present

## 2016-01-24 ENCOUNTER — Encounter (HOSPITAL_COMMUNITY): Payer: Self-pay

## 2016-01-24 ENCOUNTER — Ambulatory Visit (HOSPITAL_COMMUNITY)
Admission: EM | Admit: 2016-01-24 | Discharge: 2016-01-24 | Disposition: A | Discharge status: Home / Self Care | Payer: Commercial Managed Care - HMO | Attending: Emergency Medicine | Admitting: Emergency Medicine

## 2016-01-24 DIAGNOSIS — R079 Chest pain, unspecified: Secondary | ICD-10-CM

## 2016-01-24 DIAGNOSIS — S40022A Contusion of left upper arm, initial encounter: Secondary | ICD-10-CM | POA: Diagnosis not present

## 2016-01-24 MED ORDER — NITROGLYCERIN 0.4 MG SL SUBL: SUBLINGUAL_TABLET | SUBLINGUAL | 0 refills | Status: DC

## 2016-01-24 NOTE — ED Triage Notes (Signed)
Pt woke at 2am this morning with a pain in her left arm but didn't notice a bruise until this morning. Said she didn't fall or remember hurting it in any way

## 2016-01-24 NOTE — ED Provider Notes (Signed)
[facility]    CSN: [id number] Arrival date & time: 01/24/16  1159     History   Chief Complaint Chief Complaint  Patient presents with  . Arm Injury    woke up 2am this morning in pain and didn't notice the bruise until this morning    HPI Carolyn Adams is a 82 y.o. female.   HPI  She is an 82 year old woman here for evaluation of chest pressure. She states Friday night something woke her up and she was experiencing some pressure in her chest. This was associated with mild diaphoresis. It lasted about 10 minutes and resolved spontaneously. There was no associated shortness of breath, dizziness, or nausea. Saturday morning, she noted a bruise on her left medial upper arm. She does not remember any specific injury.  She does have a history of heart issues that are followed by Dr. Sharyn LullHarwani. She had a heart cath done 06/02/2015. This showed distal LAD lesion, 60% stenosed; mid LAD lesion, 15% stenosed; and mid RCA lesion, 15% stenosed.  Aspirin and Plavix. She does not have any nitroglycerin at home.  She has an appointment with Dr. Sharyn LullHarwani on November 16.  Past Medical History:  Diagnosis Date  . Allergy   . Anginal pain (HCC) 12/21/2011  . Chronic headaches    "but not all the time" (12/21/2011)  . Chronic sinus infection    "terrible" (12/21/2011)  . Constipation 05/17/2015  . H/O measles   . H/O mumps   . Headache 12/14/2014  . History of chicken pox   . Hyperlipidemia   . Hypertension   . Osteoarthritis    "hands" (12/21/2011)  . Vertigo     Patient Active Problem List   Diagnosis Date Noted  . Postmenopausal estrogen deficiency 11/23/2015  . Decreased visual acuity 11/23/2015  . Constipation 05/17/2015  . Headache 12/14/2014  . History of chicken pox   . Dermatitis 08/30/2013  . Preventative health care 09/07/2011  . GERD (gastroesophageal reflux disease) 11/12/2010  . HYPERGLYCEMIA 04/01/2010  . CHEST PAIN, ATYPICAL 02/22/2010  . INTERMITTENT VERTIGO  06/03/2008  . HOT FLASHES 04/10/2008  . Osteoarthritis 03/13/2007  . Hyperlipidemia 10/07/2006  . Essential hypertension 10/07/2006    Past Surgical History:  Procedure Laterality Date  . ABDOMINAL HYSTERECTOMY  1970's   partial,   . CARDIAC CATHETERIZATION N/A 06/02/2015   Procedure: Left Heart Cath and Coronary Angiography;  Surgeon: Rinaldo CloudMohan Harwani, MD;  [facility];  Service: Cardiovascular;  Laterality: N/A;  . LEFT HEART CATHETERIZATION WITH CORONARY ANGIOGRAM N/A 12/22/2011   Procedure: LEFT HEART CATHETERIZATION WITH CORONARY ANGIOGRAM;  Surgeon: Robynn PaneMohan N Harwani, MD;  Location: [facility];  Service: Cardiovascular;  Laterality: N/A;  . NASAL SEPTUM SURGERY  01/24/13   [facility]    OB History    No data available       Home Medications    Prior to Admission medications   Medication Sig Start Date End Date Taking? Authorizing Provider  allopurinol (ZYLOPRIM) 100 MG tablet TAKE 1 TABLET EVERY DAY 12/23/15  Yes Bradd CanaryStacey A Blyth, MD  amLODipine (NORVASC) 2.5 MG tablet Take 2.5 mg by mouth daily.  10/14/15  Yes Historical Provider, MD  aspirin 81 MG EC tablet Take 81 mg by mouth daily.     Yes Historical Provider, MD  azelastine (ASTELIN) 137 MCG/SPRAY nasal spray Place 2 sprays into the nose 2 (two) times daily. Use in each nostril as directed 05/29/12  Yes Doe-Hyun Sherran Needs Yoo, DO  BYSTOLIC 5 MG tablet take 1 tablet by mouth once daily 12/25/14  Yes Bradd Canary, MD  citalopram (CELEXA) 10 MG tablet TAKE 1 TABLET EVERY DAY 12/07/15  Yes Bradd Canary, MD  clopidogrel (PLAVIX) 75 MG tablet Take 1 tablet by mouth daily. 05/02/15  Yes Historical Provider, MD  pravastatin (PRAVACHOL) 40 MG tablet Take 1 tablet (40 mg total) by mouth daily. 11/11/15  Yes Bradd Canary, MD  vitamin B-12 (CYANOCOBALAMIN) 50 MCG tablet Take 50 mcg by mouth daily.   Yes Historical Provider, MD  estropipate (OGEN) 0.75 MG tablet Take 1 tablet by mouth daily. Reported on 05/08/2015 02/11/14    Historical Provider, MD  nitroGLYCERIN (NITROSTAT) 0.4 MG SL tablet Place 1 tablet under the tongue as needed for chest pressure that does not resolve in 10 minutes. 01/24/16   Charm Rings, MD    Family History Family History  Problem Relation Age of Onset  . Cancer Sister   . Coronary artery disease Brother     s/p 3 stents  . Heart disease Brother     s/p 3 stents  . Arthritis Brother   . Heart attack Son   . Heart disease Son   . Cancer Mother     breast  . Hypertension Other   . Cancer Other     lung  . Diabetes Neg Hx   . Colon cancer Neg Hx     Social History Social History  Substance Use Topics  . Smoking status: Never Smoker  . Smokeless tobacco: Never Used  . Alcohol use No     Allergies   Methylprednisolone; Ampicillin; Hydrocodone-acetaminophen; and Olmesartan medoxomil   Review of Systems Review of Systems As in history of present illness  Physical Exam Triage Vital Signs ED Triage Vitals [01/24/16 1210]  Enc Vitals Group     BP 174/83     Pulse Rate 74     Resp 16     Temp 97.3 F (36.3 C)     Temp Source Oral     SpO2 97 %     Weight      Height      Head Circumference      Peak Flow      Pain Score      Pain Loc      Pain Edu?      Excl. in GC?    No data found.   Updated Vital Signs BP 174/83 (BP Location: Right Arm)   Pulse 74   Temp 97.3 F (36.3 C) (Oral)   Resp 16   SpO2 97%   Visual Acuity Right Eye Distance:   Left Eye Distance:   Bilateral Distance:    Right Eye Near:   Left Eye Near:    Bilateral Near:     Physical Exam  Constitutional: She is oriented to person, place, and time. She appears well-developed and well-nourished. No distress.  Cardiovascular: Normal rate, regular rhythm and normal heart sounds.   No murmur heard. Pulmonary/Chest: Effort normal and breath sounds normal. She has no wheezes. She has no rales.  Neurological: She is alert and oriented to person, place, and time.  Skin:  Bruise on  left upper medial arm. This appears to be 45-70 days old. She also has a smaller bruise on the left side and some very superficial abrasions on the left side.     UC Treatments / Results  Labs (all labs ordered are listed, but only abnormal results  are displayed) Labs Reviewed - No data to display  EKG  EKG Interpretation None       Radiology No results found.  Procedures ED EKG Date/Time: 01/24/2016 12:50 PM Performed by: Charm RingsHONIG, Julien Oscar J Authorized by: Charm RingsHONIG, Deziyah Arvin J   ECG reviewed by ED Physician in the absence of a cardiologist: yes   Previous ECG:    Previous ECG:  Compared to current   Similarity:  No change Interpretation:    Interpretation: normal   Rate:    ECG rate:  72   ECG rate assessment: normal   Rhythm:    Rhythm: sinus rhythm   Ectopy:    Ectopy: none   QRS:    QRS axis:  Normal Conduction:    Conduction: normal   ST segments:    ST segments:  Normal T waves:    T waves: normal   Comments:     NSR, normal EKG   (including critical care time)  Medications Ordered in UC Medications - No data to display   Initial Impression / Assessment and Plan / UC Course  I have reviewed the triage vital signs and the nursing notes.  Pertinent labs & imaging results that were available during my care of the patient were reviewed by me and considered in my medical decision making (see chart for details).  Clinical Course     EKG is normal.  Unclear etiology of chest pain, but cardiac etiology is a possibility. She has follow-up scheduled with her cardiologist next week. We'll provide a prescription for nitroglycerin. Instructions on when and how to use this provided. I don't think the bruising is related to chest pain as the bruises appear older than 2 days. No specific treatment needed.  Final Clinical Impressions(s) / UC Diagnoses   Final diagnoses:  Arm bruise, left, initial encounter  Chest pain, unspecified type    New Prescriptions New  Prescriptions   NITROGLYCERIN (NITROSTAT) 0.4 MG SL TABLET    Place 1 tablet under the tongue as needed for chest pressure that does not resolve in 10 minutes.     Charm RingsErin J Kristel Durkee, MD 01/24/16 [phone]

## 2016-01-24 NOTE — Discharge Instructions (Signed)
Your EKG is normal. I am giving you a prescription for nitroglycerin to be on the safe side. If you develop chest pressure that does not resolve within 10 minutes, place one of the nitroglycerin tablets under your tongue and let it dissolve.  If you need to use a nitroglycerin tablet, please call 911 or go to the emergency room for additional evaluation. Follow-up with Dr. Sharyn LullHarwani as scheduled next week.

## 2016-02-08 ENCOUNTER — Other Ambulatory Visit: Payer: Self-pay | Admitting: Family Medicine

## 2016-02-17 ENCOUNTER — Other Ambulatory Visit: Payer: Self-pay | Admitting: Family Medicine

## 2016-02-18 ENCOUNTER — Ambulatory Visit (INDEPENDENT_AMBULATORY_CARE_PROVIDER_SITE_OTHER): Payer: Commercial Managed Care - HMO | Admitting: Family Medicine

## 2016-02-18 VITALS — BP 122/70 | HR 67 | Temp 97.7°F | Wt 172.4 lb

## 2016-02-18 DIAGNOSIS — E782 Mixed hyperlipidemia: Secondary | ICD-10-CM

## 2016-02-18 DIAGNOSIS — R7309 Other abnormal glucose: Secondary | ICD-10-CM | POA: Diagnosis not present

## 2016-02-18 DIAGNOSIS — I10 Essential (primary) hypertension: Secondary | ICD-10-CM

## 2016-02-18 DIAGNOSIS — K219 Gastro-esophageal reflux disease without esophagitis: Secondary | ICD-10-CM

## 2016-02-18 DIAGNOSIS — R413 Other amnesia: Secondary | ICD-10-CM

## 2016-02-18 DIAGNOSIS — K5900 Constipation, unspecified: Secondary | ICD-10-CM

## 2016-02-18 LAB — TSH: TSH: 2.17 u[IU]/mL (ref 0.35–4.50)

## 2016-02-18 LAB — LIPID PANEL
CHOLESTEROL: 206 mg/dL — AB (ref 0–200)
HDL: 66.1 mg/dL (ref >39.00)
LDL CALC: 108 mg/dL — AB (ref 0–99)
NonHDL: 140.21
TRIGLYCERIDES: 160 mg/dL — AB (ref 0.0–149.0)
Total CHOL/HDL Ratio: 3
VLDL: 32 mg/dL (ref 0.0–40.0)

## 2016-02-18 LAB — COMPREHENSIVE METABOLIC PANEL
ALBUMIN: 4.3 g/dL (ref 3.5–5.2)
ALK PHOS: 90 U/L (ref 39–117)
ALT: 15 U/L (ref 0–35)
AST: 19 U/L (ref 0–37)
BUN: 13 mg/dL (ref 6–23)
CALCIUM: 9.8 mg/dL (ref 8.4–10.5)
CHLORIDE: 105 meq/L (ref 96–112)
CO2: 28 mEq/L (ref 19–32)
Creatinine, Ser: 1.13 mg/dL (ref 0.40–1.20)
GFR: 49.23 mL/min — AB (ref >60.00)
Glucose, Bld: 102 mg/dL — ABNORMAL HIGH (ref 70–99)
POTASSIUM: 4 meq/L (ref 3.5–5.1)
SODIUM: 141 meq/L (ref 135–145)
TOTAL PROTEIN: 7.7 g/dL (ref 6.0–8.3)
Total Bilirubin: 0.6 mg/dL (ref 0.2–1.2)

## 2016-02-18 LAB — CBC
HEMATOCRIT: 41 % (ref 36.0–46.0)
HEMOGLOBIN: 13.8 g/dL (ref 12.0–15.0)
MCHC: 33.6 g/dL (ref 30.0–36.0)
MCV: 87.4 fl (ref 78.0–100.0)
PLATELETS: 280 10*3/uL (ref 150.0–400.0)
RBC: 4.69 Mil/uL (ref 3.87–5.11)
RDW: 13.6 % (ref 11.5–15.5)
WBC: 6.7 10*3/uL (ref 4.0–10.5)

## 2016-02-18 LAB — HEMOGLOBIN A1C: Hgb A1c MFr Bld: 6.1 % (ref 4.6–6.5)

## 2016-02-18 NOTE — Patient Instructions (Addendum)
Vitamin D 2,000 units a day Mylanta as needed for heartburn and chest pain if no relief get looked at  Encouraged increased hydration and fiber in diet. Daily probiotics. If bowels not moving can use MOM 2 tbls po in 4 oz of warm prune juice by mouth every 2-3 days. If no results then repeat in 4 hours with  Dulcolax suppository pr, may repeat again in 4 more hours as needed. Seek care if symptoms worsen. Consider daily Miralax and/or Dulcolax if symptoms persist. 64 oz of clear fluids and exercise Hypertension Hypertension, commonly called high blood pressure, is when the force of blood pumping through your arteries is too strong. Your arteries are the blood vessels that carry blood from your heart throughout your body. A blood pressure reading consists of a higher number over a lower number, such as 110/72. The higher number (systolic) is the pressure inside your arteries when your heart pumps. The lower number (diastolic) is the pressure inside your arteries when your heart relaxes. Ideally you want your blood pressure below 120/80. Hypertension forces your heart to work harder to pump blood. Your arteries may become narrow or stiff. Having untreated or uncontrolled hypertension can cause heart attack, stroke, kidney disease, and other problems. What increases the risk? Some risk factors for high blood pressure are controllable. Others are not. Risk factors you cannot control include:  Race. You may be at higher risk if you are African American.  Age. Risk increases with age.  Gender. Men are at higher risk than women before age 80 years. After age 80, women are at higher risk than men. Risk factors you can control include:  Not getting enough exercise or physical activity.  Being overweight.  Getting too much fat, sugar, calories, or salt in your diet.  Drinking too much alcohol. What are the signs or symptoms? Hypertension does not usually cause signs or symptoms. Extremely high blood  pressure (hypertensive crisis) may cause headache, anxiety, shortness of breath, and nosebleed. How is this diagnosed? To check if you have hypertension, your health care provider will measure your blood pressure while you are seated, with your arm held at the level of your heart. It should be measured at least twice using the same arm. Certain conditions can cause a difference in blood pressure between your right and left arms. A blood pressure reading that is higher than normal on one occasion does not mean that you need treatment. If it is not clear whether you have high blood pressure, you may be asked to return on a different day to have your blood pressure checked again. Or, you may be asked to monitor your blood pressure at home for 1 or more weeks. How is this treated? Treating high blood pressure includes making lifestyle changes and possibly taking medicine. Living a healthy lifestyle can help lower high blood pressure. You may need to change some of your habits. Lifestyle changes may include:  Following the DASH diet. This diet is high in fruits, vegetables, and whole grains. It is low in salt, red meat, and added sugars.  Keep your sodium intake below 2,300 mg per day.  Getting at least 30-45 minutes of aerobic exercise at least 4 times per week.  Losing weight if necessary.  Not smoking.  Limiting alcoholic beverages.  Learning ways to reduce stress. Your health care provider may prescribe medicine if lifestyle changes are not enough to get your blood pressure under control, and if one of the following is true:  You are 2118-80 years of age and your systolic blood pressure is above 140.  You are 80 years of age or older, and your systolic blood pressure is above 150.  Your diastolic blood pressure is above 90.  You have diabetes, and your systolic blood pressure is over 140 or your diastolic blood pressure is over 90.  You have kidney disease and your blood pressure is above  140/90.  You have heart disease and your blood pressure is above 140/90. Your personal target blood pressure may vary depending on your medical conditions, your age, and other factors. Follow these instructions at home:  Have your blood pressure rechecked as directed by your health care provider.  Take medicines only as directed by your health care provider. Follow the directions carefully. Blood pressure medicines must be taken as prescribed. The medicine does not work as well when you skip doses. Skipping doses also puts you at risk for problems.  Do not smoke.  Monitor your blood pressure at home as directed by your health care provider. Contact a health care provider if:  You think you are having a reaction to medicines taken.  You have recurrent headaches or feel dizzy.  You have swelling in your ankles.  You have trouble with your vision. Get help right away if:  You develop a severe headache or confusion.  You have unusual weakness, numbness, or feel faint.  You have severe chest or abdominal pain.  You vomit repeatedly.  You have trouble breathing. This information is not intended to replace advice given to you by your health care provider. Make sure you discuss any questions you have with your health care provider. Document Released: 03/07/2005 Document Revised: 08/13/2015 Document Reviewed: 12/28/2012 Elsevier Interactive Patient Education  2017 [employer]

## 2016-02-18 NOTE — Progress Notes (Signed)
Patient ID: Carolyn Adams, female   DOB: [birth year], 82 y.o.   MRN: [id number]   Subjective:    Patient ID: Carolyn Adams, female    DOB: 07-Sep-1933, 82 y.o.   MRN: [id number]  Chief Complaint  Patient presents with  . Follow-up    HPI Patient is in today for 3 month follow up pt c/o of sob a few weeks ago when she stated she was laying down. Patient also c/o of constipation. The SOB has not recurred. The constipation is still present. No bloody or tarry stool. Otherwise feels well. No recent hospitalization or acute concerns. Doing well with ADLs and staying active as able. Bowels move a couple times a week. Denies CP/palp/SOB/HA/congestion/fevers or GU c/o. Taking meds as prescribed  Past Medical History:  Diagnosis Date  . Allergy   . Anginal pain (HCC) 12/21/2011  . Chronic headaches    "but not all the time" (12/21/2011)  . Chronic sinus infection    "terrible" (12/21/2011)  . Constipation 05/17/2015  . H/O measles   . H/O mumps   . Headache 12/14/2014  . History of chicken pox   . Hyperlipidemia   . Hypertension   . Memory loss 02/28/2016  . Osteoarthritis    "hands" (12/21/2011)  . Vertigo     Past Surgical History:  Procedure Laterality Date  . ABDOMINAL HYSTERECTOMY  1970's   partial,   . CARDIAC CATHETERIZATION N/A 06/02/2015   Procedure: Left Heart Cath and Coronary Angiography;  Surgeon: Rinaldo Cloud, MD;  Location: [facility] INVASIVE CV LAB;  Service: Cardiovascular;  Laterality: N/A;  . LEFT HEART CATHETERIZATION WITH CORONARY ANGIOGRAM N/A 12/22/2011   Procedure: LEFT HEART CATHETERIZATION WITH CORONARY ANGIOGRAM;  Surgeon: Robynn Pane, MD;  Location: [facility] CATH LAB;  Service: Cardiovascular;  Laterality: N/A;  . NASAL SEPTUM SURGERY  01/24/13   [facility]    Family History  Problem Relation Age of Onset  . Cancer Sister   . Coronary artery disease Brother     s/p 3 stents  . Heart disease Brother     s/p 3 stents  . Arthritis Brother   . Heart attack  Son   . Heart disease Son   . Cancer Mother     breast  . Hypertension Other   . Cancer Other     lung  . Diabetes Neg Hx   . Colon cancer Neg Hx     Social History   Social History  . Marital status: Widowed    Spouse name: N/A  . Number of children: N/A  . Years of education: N/A   Occupational History  . 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 Smoking status: Never Smoker  . Smokeless tobacco: Never Used  . Alcohol use No  . Drug use: No  . Sexual activity: No     Comment: lives alone, follows heart healthy. 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 Not on file   Social History Narrative   Physician roster   Ophthalmologist-Dr. Elmer Picker   ENT- Dr. Annalee Genta    Outpatient Medications Prior to Visit  Medication Sig Dispense Refill  . amLODipine (NORVASC) 2.5 MG tablet Take 2.5 mg by mouth daily.   0  . aspirin 81 MG EC tablet Take 81 mg by mouth daily.      Marland Kitchen azelastine (ASTELIN) 137 MCG/SPRAY nasal spray Place 2 sprays into the nose 2 (two) times daily. Use in  each nostril as directed 30 mL 5  . BYSTOLIC 5 MG tablet take 1 tablet by mouth once daily 90 tablet 0  . citalopram (CELEXA) 10 MG tablet TAKE 1 TABLET EVERY DAY 90 tablet 2  . clopidogrel (PLAVIX) 75 MG tablet Take 1 tablet by mouth daily.  0  . estropipate (OGEN) 0.75 MG tablet Take 1 tablet by mouth daily. Reported on 05/08/2015  0  . nitroGLYCERIN (NITROSTAT) 0.4 MG SL tablet Place 1 tablet under the tongue as needed for chest pressure that does not resolve in 10 minutes. 10 tablet 0  . pravastatin (PRAVACHOL) 40 MG tablet TAKE 1 TABLET EVERY DAY 90 tablet 1  . vitamin B-12 (CYANOCOBALAMIN) 50 MCG tablet Take 50 mcg by mouth daily.    Marland Kitchen. allopurinol (ZYLOPRIM) 100 MG tablet TAKE 1 TABLET EVERY DAY 90 tablet 0   Facility-Administered Medications Prior to Visit  Medication Dose Route Frequency Provider Last Rate Last Dose  . ipratropium (ATROVENT) nebulizer  solution 0.5 mg  0.5 mg Nebulization Once Jonita Albeehris W Guest, MD        Allergies  Allergen Reactions  . Methylprednisolone Palpitations    Chest pain  . Ampicillin Other (See Comments)    REACTION: Chest Pain  . Hydrocodone-Acetaminophen Nausea And Vomiting  . Olmesartan Medoxomil Nausea Only    REACTION: stomach upse    Review of Systems  Constitutional: Negative for fever.  Eyes: Negative for blurred vision.  Respiratory: Positive for shortness of breath. Negative for cough.   Cardiovascular: Negative for chest pain and palpitations.  Gastrointestinal: Negative for vomiting.  Musculoskeletal: Negative for back pain.  Skin: Negative for rash.  Neurological: Negative for loss of consciousness and headaches.       Objective:    Physical Exam  Constitutional: She is oriented to person, place, and time. She appears well-developed and well-nourished. No distress.  HENT:  Head: Normocephalic and atraumatic.  Eyes: Conjunctivae are normal.  Neck: Normal range of motion. No thyromegaly present.  Cardiovascular: Normal rate and regular rhythm.   Pulmonary/Chest: Effort normal and breath sounds normal. She has no wheezes.  Abdominal: Soft. Bowel sounds are normal. There is no tenderness.  Musculoskeletal: Normal range of motion. She exhibits no edema or deformity.  Neurological: She is alert and oriented to person, place, and time.  Skin: Skin is warm and dry. She is not diaphoretic.  Psychiatric: She has a normal mood and affect.    BP 122/70 (BP Location: Left Arm, Patient Position: Sitting, Cuff Size: Normal)   Pulse 67   Temp 97.7 F (36.5 C) (Oral)   Wt 172 lb 6.4 oz (78.2 kg)   SpO2 96%   BMI 30.54 kg/m  Wt Readings from Last 3 Encounters:  02/18/16 172 lb 6.4 oz (78.2 kg)  11/10/15 175 lb 12.8 oz (79.7 kg)  06/02/15 160 lb (72.6 kg)     Lab Results  Component Value Date   WBC 6.7 02/18/2016   HGB 13.8 02/18/2016   HCT 41.0 02/18/2016   PLT 280.0 02/18/2016    GLUCOSE 102 (H) 02/18/2016   CHOL 206 (H) 02/18/2016   TRIG 160.0 (H) 02/18/2016   HDL 66.10 02/18/2016   LDLDIRECT 146.0 11/10/2015   LDLCALC 108 (H) 02/18/2016   ALT 15 02/18/2016   AST 19 02/18/2016   NA 141 02/18/2016   K 4.0 02/18/2016   CL 105 02/18/2016   CREATININE 1.13 02/18/2016   BUN 13 02/18/2016   CO2 28 02/18/2016   TSH 2.17  02/18/2016   INR 1.15 12/21/2011   HGBA1C 6.1 02/18/2016    Lab Results  Component Value Date   TSH 2.17 02/18/2016   Lab Results  Component Value Date   WBC 6.7 02/18/2016   HGB 13.8 02/18/2016   HCT 41.0 02/18/2016   MCV 87.4 02/18/2016   PLT 280.0 02/18/2016   Lab Results  Component Value Date   NA 141 02/18/2016   K 4.0 02/18/2016   CO2 28 02/18/2016   GLUCOSE 102 (H) 02/18/2016   BUN 13 02/18/2016   CREATININE 1.13 02/18/2016   BILITOT 0.6 02/18/2016   ALKPHOS 90 02/18/2016   AST 19 02/18/2016   ALT 15 02/18/2016   PROT 7.7 02/18/2016   ALBUMIN 4.3 02/18/2016   CALCIUM 9.8 02/18/2016   GFR 49.23 (L) 02/18/2016   Lab Results  Component Value Date   CHOL 206 (H) 02/18/2016   Lab Results  Component Value Date   HDL 66.10 02/18/2016   Lab Results  Component Value Date   LDLCALC 108 (H) 02/18/2016   Lab Results  Component Value Date   TRIG 160.0 (H) 02/18/2016   Lab Results  Component Value Date   CHOLHDL 3 02/18/2016   Lab Results  Component Value Date   HGBA1C 6.1 02/18/2016       Assessment & Plan:   Problem List Items Addressed This Visit    Hyperlipidemia    Tolerating statin, encouraged heart healthy diet, avoid trans fats, minimize simple carbs and saturated fats. Increase exercise as tolerated      Relevant Orders   Lipid panel (Completed)   Essential hypertension - Primary    Well controlled, no changes to meds. Encouraged heart healthy diet such as the DASH diet and exercise as tolerated.       Relevant Orders   CBC (Completed)   Comprehensive metabolic panel (Completed)   TSH  (Completed)   HYPERGLYCEMIA    hgba1c acceptable, minimize simple carbs. Increase exercise as tolerated.       Relevant Orders   Hemoglobin A1c (Completed)   GERD (gastroesophageal reflux disease)    Had a recent episode of chest pain at night suspicious for reflux, encouraged Mylanta prn       Constipation    Encouraged increased hydration and fiber in diet. Daily probiotics. If bowels not moving can use MOM 2 tbls po in 4 oz of warm prune juice by mouth every 2-3 days. If no results then repeat in 4 hours with  Dulcolax suppository pr, may repeat again in 4 more hours as needed. Seek care if symptoms worsen. Consider daily Miralax and/or Dulcolax if symptoms persist.       Memory loss    MMSE 24/30. Encouraged to maintain heart healthy diet, stay active and stay mentally active with brain gym etc.          I am having Ms. Clovis RileyMitchell maintain her aspirin, azelastine, estropipate, BYSTOLIC, clopidogrel, vitamin B-12, amLODipine, nitroGLYCERIN, citalopram, and pravastatin. We will continue to administer ipratropium.  No orders of the defined types were placed in this encounter.    Danise EdgeBLYTH, STACEY, MD

## 2016-02-18 NOTE — Assessment & Plan Note (Signed)
Well controlled, no changes to meds. Encouraged heart healthy diet such as the DASH diet and exercise as tolerated.  °

## 2016-02-18 NOTE — Progress Notes (Signed)
Pre visit review using our clinic review tool, if applicable. No additional management support is needed unless otherwise documented below in the visit note. 

## 2016-02-18 NOTE — Assessment & Plan Note (Signed)
Encouraged increased hydration and fiber in diet. Daily probiotics. If bowels not moving can use MOM 2 tbls po in 4 oz of warm prune juice by mouth every 2-3 days. If no results then repeat in 4 hours with  Dulcolax suppository pr, may repeat again in 4 more hours as needed. Seek care if symptoms worsen. Consider daily Miralax and/or Dulcolax if symptoms persist.  

## 2016-02-18 NOTE — Assessment & Plan Note (Signed)
hgba1c acceptable, minimize simple carbs. Increase exercise as tolerated.  

## 2016-02-18 NOTE — Assessment & Plan Note (Signed)
Tolerating statin, encouraged heart healthy diet, avoid trans fats, minimize simple carbs and saturated fats. Increase exercise as tolerated 

## 2016-02-18 NOTE — Assessment & Plan Note (Signed)
Had a recent episode of chest pain at night suspicious for reflux, encouraged Mylanta prn

## 2016-02-24 ENCOUNTER — Other Ambulatory Visit: Payer: Self-pay | Admitting: Family Medicine

## 2016-02-28 ENCOUNTER — Encounter: Payer: Self-pay | Admitting: Family Medicine

## 2016-02-28 DIAGNOSIS — R413 Other amnesia: Secondary | ICD-10-CM

## 2016-02-28 HISTORY — DX: Other amnesia: R41.3

## 2016-02-28 NOTE — Assessment & Plan Note (Signed)
MMSE 24/30. Encouraged to maintain heart healthy diet, stay active and stay mentally active with brain gym etc.

## 2016-04-18 DIAGNOSIS — I2510 Atherosclerotic heart disease of native coronary artery without angina pectoris: Secondary | ICD-10-CM | POA: Diagnosis not present

## 2016-04-18 DIAGNOSIS — I10 Essential (primary) hypertension: Secondary | ICD-10-CM | POA: Diagnosis not present

## 2016-04-18 DIAGNOSIS — M109 Gout, unspecified: Secondary | ICD-10-CM | POA: Diagnosis not present

## 2016-04-18 DIAGNOSIS — R7309 Other abnormal glucose: Secondary | ICD-10-CM | POA: Diagnosis not present

## 2016-04-18 DIAGNOSIS — E785 Hyperlipidemia, unspecified: Secondary | ICD-10-CM | POA: Diagnosis not present

## 2016-06-27 ENCOUNTER — Other Ambulatory Visit: Payer: Self-pay | Admitting: Family Medicine

## 2016-07-11 ENCOUNTER — Other Ambulatory Visit: Payer: Self-pay | Admitting: Family Medicine

## 2016-07-22 DIAGNOSIS — R7309 Other abnormal glucose: Secondary | ICD-10-CM | POA: Diagnosis not present

## 2016-07-22 DIAGNOSIS — I10 Essential (primary) hypertension: Secondary | ICD-10-CM | POA: Diagnosis not present

## 2016-07-22 DIAGNOSIS — M109 Gout, unspecified: Secondary | ICD-10-CM | POA: Diagnosis not present

## 2016-07-22 DIAGNOSIS — E785 Hyperlipidemia, unspecified: Secondary | ICD-10-CM | POA: Diagnosis not present

## 2016-07-22 DIAGNOSIS — I2510 Atherosclerotic heart disease of native coronary artery without angina pectoris: Secondary | ICD-10-CM | POA: Diagnosis not present

## 2016-08-19 ENCOUNTER — Telehealth: Payer: Self-pay | Admitting: Family Medicine

## 2016-08-19 ENCOUNTER — Ambulatory Visit: Payer: Self-pay | Admitting: Family Medicine

## 2016-08-19 DIAGNOSIS — Z0289 Encounter for other administrative examinations: Secondary | ICD-10-CM

## 2016-08-19 NOTE — Telephone Encounter (Addendum)
Patient lvm today at 9am cancelling 10am appointment today due to her not feeling well, patient states she will call back to rsc, charge or no charge

## 2016-08-21 NOTE — Telephone Encounter (Signed)
No charge. 

## 2016-09-09 ENCOUNTER — Other Ambulatory Visit: Payer: Self-pay | Admitting: Family Medicine

## 2016-09-12 NOTE — Telephone Encounter (Signed)
Pt is due for follow up please call and schedule appointment.  

## 2016-09-20 ENCOUNTER — Telehealth: Payer: Self-pay | Admitting: Family Medicine

## 2016-09-20 NOTE — Telephone Encounter (Signed)
Patient needed to cancel appt on 09/22/16 with PCP due to conflict

## 2016-09-20 NOTE — Telephone Encounter (Signed)
Patient scheduled for 09/22/16 with PCP

## 2016-09-20 NOTE — Telephone Encounter (Signed)
Patient left a VM on [facility] VM to call her back.

## 2016-09-22 ENCOUNTER — Ambulatory Visit: Payer: Medicare HMO | Admitting: Family Medicine

## 2016-09-27 ENCOUNTER — Ambulatory Visit (INDEPENDENT_AMBULATORY_CARE_PROVIDER_SITE_OTHER): Payer: Medicare HMO | Admitting: Family Medicine

## 2016-09-27 ENCOUNTER — Encounter: Payer: Self-pay | Admitting: Family Medicine

## 2016-09-27 DIAGNOSIS — E782 Mixed hyperlipidemia: Secondary | ICD-10-CM

## 2016-09-27 DIAGNOSIS — I10 Essential (primary) hypertension: Secondary | ICD-10-CM

## 2016-09-27 DIAGNOSIS — R7309 Other abnormal glucose: Secondary | ICD-10-CM

## 2016-09-27 DIAGNOSIS — M109 Gout, unspecified: Secondary | ICD-10-CM | POA: Diagnosis not present

## 2016-09-27 DIAGNOSIS — E669 Obesity, unspecified: Secondary | ICD-10-CM | POA: Diagnosis not present

## 2016-09-27 LAB — COMPREHENSIVE METABOLIC PANEL
ALBUMIN: 4 g/dL (ref 3.5–5.2)
ALT: 15 U/L (ref 0–35)
AST: 19 U/L (ref 0–37)
Alkaline Phosphatase: 88 U/L (ref 39–117)
BILIRUBIN TOTAL: 0.5 mg/dL (ref 0.2–1.2)
BUN: 16 mg/dL (ref 6–23)
CALCIUM: 9.7 mg/dL (ref 8.4–10.5)
CHLORIDE: 105 meq/L (ref 96–112)
CO2: 26 meq/L (ref 19–32)
Creatinine, Ser: 1.1 mg/dL (ref 0.40–1.20)
GFR: 50.7 mL/min — AB (ref >60.00)
Glucose, Bld: 111 mg/dL — ABNORMAL HIGH (ref 70–99)
Potassium: 3.9 mEq/L (ref 3.5–5.1)
Sodium: 139 mEq/L (ref 135–145)
Total Protein: 7.4 g/dL (ref 6.0–8.3)

## 2016-09-27 LAB — LIPID PANEL
CHOLESTEROL: 226 mg/dL — AB (ref 0–200)
HDL: 57.4 mg/dL (ref >39.00)
NonHDL: 169.08
TRIGLYCERIDES: 223 mg/dL — AB (ref 0.0–149.0)
Total CHOL/HDL Ratio: 4
VLDL: 44.6 mg/dL — AB (ref 0.0–40.0)

## 2016-09-27 LAB — CBC
HCT: 38.8 % (ref 36.0–46.0)
Hemoglobin: 13 g/dL (ref 12.0–15.0)
MCHC: 33.4 g/dL (ref 30.0–36.0)
MCV: 90 fl (ref 78.0–100.0)
Platelets: 271 10*3/uL (ref 150.0–400.0)
RBC: 4.32 Mil/uL (ref 3.87–5.11)
RDW: 14 % (ref 11.5–15.5)
WBC: 6.3 10*3/uL (ref 4.0–10.5)

## 2016-09-27 LAB — HEMOGLOBIN A1C: HEMOGLOBIN A1C: 6.4 % (ref 4.6–6.5)

## 2016-09-27 LAB — URIC ACID: URIC ACID, SERUM: 4.8 mg/dL (ref 2.4–7.0)

## 2016-09-27 LAB — TSH: TSH: 2.4 u[IU]/mL (ref 0.35–4.50)

## 2016-09-27 LAB — LDL CHOLESTEROL, DIRECT: LDL DIRECT: 131 mg/dL

## 2016-09-27 NOTE — Assessment & Plan Note (Signed)
hgba1c acceptable, minimize simple carbs. Increase exercise as tolerated.  

## 2016-09-27 NOTE — Assessment & Plan Note (Signed)
Well controlled, no changes to meds. Encouraged heart healthy diet such as the DASH diet and exercise as tolerated.  °

## 2016-09-27 NOTE — Assessment & Plan Note (Signed)
Patient does not recall any gout flares and her uric acid in 2016 was 5.8. Will d/c Allopurinol and monitor

## 2016-09-27 NOTE — Assessment & Plan Note (Signed)
Encouraged DASH diet, decrease po intake and increase exercise as tolerated. Needs 7-8 hours of sleep nightly. Avoid trans fats, eat small, frequent meals every 4-5 hours with lean proteins, complex carbs and healthy fats. Minimize simple carbs, declines bariatric referral 

## 2016-09-27 NOTE — Patient Instructions (Addendum)
New shingles shot is called Shingrix it is two shots over 6 months. Can get at pharmacy  Hypertension Hypertension, commonly called high blood pressure, is when the force of blood pumping through the arteries is too strong. The arteries are the blood vessels that carry blood from the heart throughout the body. Hypertension forces the heart to work harder to pump blood and may cause arteries to become narrow or stiff. Having untreated or uncontrolled hypertension can cause heart attacks, strokes, kidney disease, and other problems. A blood pressure reading consists of a higher number over a lower number. Ideally, your blood pressure should be below 120/80. The first ("top") number is called the systolic pressure. It is a measure of the pressure in your arteries as your heart beats. The second ("bottom") number is called the diastolic pressure. It is a measure of the pressure in your arteries as the heart relaxes. What are the causes? The cause of this condition is not known. What increases the risk? Some risk factors for high blood pressure are under your control. Others are not. Factors you can change  Smoking.  Having type 2 diabetes mellitus, high cholesterol, or both.  Not getting enough exercise or physical activity.  Being overweight.  Having too much fat, sugar, calories, or salt (sodium) in your diet.  Drinking too much alcohol. Factors that are difficult or impossible to change  Having chronic kidney disease.  Having a family history of high blood pressure.  Age. Risk increases with age.  Race. You may be at higher risk if you are African-American.  Gender. Men are at higher risk than women before age 81. After age 81, women are at higher risk than men.  Having obstructive sleep apnea.  Stress. What are the signs or symptoms? Extremely high blood pressure (hypertensive crisis) may cause:  Headache.  Anxiety.  Shortness of breath.  Nosebleed.  Nausea and  vomiting.  Severe chest pain.  Jerky movements you cannot control (seizures).  How is this diagnosed? This condition is diagnosed by measuring your blood pressure while you are seated, with your arm resting on a surface. The cuff of the blood pressure monitor will be placed directly against the skin of your upper arm at the level of your heart. It should be measured at least twice using the same arm. Certain conditions can cause a difference in blood pressure between your right and left arms. Certain factors can cause blood pressure readings to be lower or higher than normal (elevated) for a short period of time:  When your blood pressure is higher when you are in a health care provider's office than when you are at home, this is called white coat hypertension. Most people with this condition do not need medicines.  When your blood pressure is higher at home than when you are in a health care provider's office, this is called masked hypertension. Most people with this condition may need medicines to control blood pressure.  If you have a high blood pressure reading during one visit or you have normal blood pressure with other risk factors:  You may be asked to return on a different day to have your blood pressure checked again.  You may be asked to monitor your blood pressure at home for 1 week or longer.  If you are diagnosed with hypertension, you may have other blood or imaging tests to help your health care provider understand your overall risk for other conditions. How is this treated? This condition is treated  by making healthy lifestyle changes, such as eating healthy foods, exercising more, and reducing your alcohol intake. Your health care provider may prescribe medicine if lifestyle changes are not enough to get your blood pressure under control, and if:  Your systolic blood pressure is above 130.  Your diastolic blood pressure is above 80.  Your personal target blood pressure  may vary depending on your medical conditions, your age, and other factors. Follow these instructions at home: Eating and drinking  Eat a diet that is high in fiber and potassium, and low in sodium, added sugar, and fat. An example eating plan is called the DASH (Dietary Approaches to Stop Hypertension) diet. To eat this way: ? Eat plenty of fresh fruits and vegetables. Try to fill half of your plate at each meal with fruits and vegetables. ? Eat whole grains, such as whole wheat pasta, brown rice, or whole grain bread. Fill about one quarter of your plate with whole grains. ? Eat or drink low-fat dairy products, such as skim milk or low-fat yogurt. ? Avoid fatty cuts of meat, processed or cured meats, and poultry with skin. Fill about one quarter of your plate with lean proteins, such as fish, chicken without skin, beans, eggs, and tofu. ? Avoid premade and processed foods. These tend to be higher in sodium, added sugar, and fat.  Reduce your daily sodium intake. Most people with hypertension should eat less than 1,500 mg of sodium a day.  Limit alcohol intake to no more than 1 drink a day for nonpregnant women and 2 drinks a day for men. One drink equals 12 oz of beer, 5 oz of wine, or 1 oz of hard liquor. Lifestyle  Work with your health care provider to maintain a healthy body weight or to lose weight. Ask what an ideal weight is for you.  Get at least 30 minutes of exercise that causes your heart to beat faster (aerobic exercise) most days of the week. Activities may include walking, swimming, or biking.  Include exercise to strengthen your muscles (resistance exercise), such as pilates or lifting weights, as part of your weekly exercise routine. Try to do these types of exercises for 30 minutes at least 3 days a week.  Do not use any products that contain nicotine or tobacco, such as cigarettes and e-cigarettes. If you need help quitting, ask your health care provider.  Monitor your  blood pressure at home as told by your health care provider.  Keep all follow-up visits as told by your health care provider. This is important. Medicines  Take over-the-counter and prescription medicines only as told by your health care provider. Follow directions carefully. Blood pressure medicines must be taken as prescribed.  Do not skip doses of blood pressure medicine. Doing this puts you at risk for problems and can make the medicine less effective.  Ask your health care provider about side effects or reactions to medicines that you should watch for. Contact a health care provider if:  You think you are having a reaction to a medicine you are taking.  You have headaches that keep coming back (recurring).  You feel dizzy.  You have swelling in your ankles.  You have trouble with your vision. Get help right away if:  You develop a severe headache or confusion.  You have unusual weakness or numbness.  You feel faint.  You have severe pain in your chest or abdomen.  You vomit repeatedly.  You have trouble breathing. Summary  Hypertension  is when the force of blood pumping through your arteries is too strong. If this condition is not controlled, it may put you at risk for serious complications.  Your personal target blood pressure may vary depending on your medical conditions, your age, and other factors. For most people, a normal blood pressure is less than 120/80.  Hypertension is treated with lifestyle changes, medicines, or a combination of both. Lifestyle changes include weight loss, eating a healthy, low-sodium diet, exercising more, and limiting alcohol. This information is not intended to replace advice given to you by your health care provider. Make sure you discuss any questions you have with your health care provider. Document Released: 03/07/2005 Document Revised: 02/03/2016 Document Reviewed: 02/03/2016 Elsevier Interactive Patient Education  [employer].

## 2016-09-27 NOTE — Assessment & Plan Note (Signed)
Tolerating statin, encouraged heart healthy diet, avoid trans fats, minimize simple carbs and saturated fats. Increase exercise as tolerated 

## 2016-09-27 NOTE — Progress Notes (Signed)
Subjective:  I acted as a Neurosurgeon for Dr. Abner Greenspan. [name], [place]  Patient ID: Carolyn Adams, female    DOB: 12-08-1933, 83 y.o.   MRN: [id number]  No chief complaint on file.   HPI  Patient is in today for a medication follow up. Patient has no acute concerns. No recent febrile illness or acute hospitalizations. Denies CP/palp/SOB/HA/congestion/fevers/GI or GU c/o. Taking meds as prescribed. She feels well today she did have to get rid of her dog recently because he became unruly and gave her a bruise on her left arm but it is healing well. She fell and got drug when the dog tried to run so she is a little sad but otherwise well   Patient Care Team: Bradd Canary, MD as PCP - General (Family Medicine) Dr Payton Doughty  (GYN) (Gynecology) Mardella Layman, MD (Gastroenterology) Rinaldo Cloud, MD as Consulting Physician (Cardiology)   Past Medical History:  Diagnosis Date  . Allergy   . Anginal pain (HCC) 12/21/2011  . Chronic headaches    "but not all the time" (12/21/2011)  . Chronic sinus infection    "terrible" (12/21/2011)  . Constipation 05/17/2015  . H/O measles   . H/O mumps   . Headache 12/14/2014  . History of chicken pox   . Hyperlipidemia   . Hypertension   . Memory loss 02/28/2016  . Osteoarthritis    "hands" (12/21/2011)  . Vertigo     Past Surgical History:  Procedure Laterality Date  . ABDOMINAL HYSTERECTOMY  1970's   partial,   . CARDIAC CATHETERIZATION N/A 06/02/2015   Procedure: Left Heart Cath and Coronary Angiography;  Surgeon: Rinaldo Cloud, MD;  Location: [facility] INVASIVE CV LAB;  Service: Cardiovascular;  Laterality: N/A;  . LEFT HEART CATHETERIZATION WITH CORONARY ANGIOGRAM N/A 12/22/2011   Procedure: LEFT HEART CATHETERIZATION WITH CORONARY ANGIOGRAM;  Surgeon: Robynn Pane, MD;  Location: [facility] CATH LAB;  Service: Cardiovascular;  Laterality: N/A;  . NASAL SEPTUM SURGERY  01/24/13   [facility]    Family History  Problem Relation Age of Onset  .  Cancer Sister   . Coronary artery disease Brother        s/p 3 stents  . Heart disease Brother        s/p 3 stents  . Arthritis Brother   . Heart attack Son   . Heart disease Son   . Cancer Mother        breast  . Hypertension Other   . Cancer Other        lung  . Diabetes Neg Hx   . Colon cancer Neg Hx     Social History   Social History  . Marital status: Widowed    Spouse name: N/A  . Number of children: N/A  . Years of education: N/A   Occupational History  . 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 Smoking status: Never Smoker  . Smokeless tobacco: Never Used  . Alcohol use No  . Drug use: No  . Sexual activity: No     Comment: lives alone, follows heart healthy. 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 Not on file   Social History Narrative   Physician roster   Ophthalmologist-Dr. Elmer Picker   ENT- Dr. Annalee Genta    Outpatient Medications Prior to Visit  Medication Sig Dispense Refill  . amLODipine (NORVASC) 2.5 MG tablet Take 2.5 mg by mouth daily.  0  . aspirin 81 MG EC tablet Take 81 mg by mouth daily.      Marland Kitchen. azelastine (ASTELIN) 137 MCG/SPRAY nasal spray Place 2 sprays into the nose 2 (two) times daily. Use in each nostril as directed 30 mL 5  . citalopram (CELEXA) 10 MG tablet TAKE 1 TABLET EVERY DAY 90 tablet 0  . clopidogrel (PLAVIX) 75 MG tablet Take 1 tablet by mouth daily.  0  . estropipate (OGEN) 0.75 MG tablet Take 1 tablet by mouth daily. Reported on 05/08/2015  0  . nitroGLYCERIN (NITROSTAT) 0.4 MG SL tablet Place 1 tablet under the tongue as needed for chest pressure that does not resolve in 10 minutes. 10 tablet 0  . pravastatin (PRAVACHOL) 40 MG tablet TAKE 1 TABLET EVERY DAY 90 tablet 1  . allopurinol (ZYLOPRIM) 100 MG tablet TAKE 1 TABLET EVERY DAY 90 tablet 1  . BYSTOLIC 5 MG tablet take 1 tablet by mouth once daily 90 tablet 0  . vitamin B-12 (CYANOCOBALAMIN) 50 MCG tablet Take 50 mcg by  mouth daily.    Marland Kitchen. ipratropium (ATROVENT) nebulizer solution 0.5 mg      No facility-administered medications prior to visit.     Allergies  Allergen Reactions  . Methylprednisolone Palpitations    Chest pain  . Ampicillin Other (See Comments)    REACTION: Chest Pain  . Hydrocodone-Acetaminophen Nausea And Vomiting  . Olmesartan Medoxomil Nausea Only    REACTION: stomach upse    Review of Systems  Constitutional: Negative for fever and malaise/fatigue.  HENT: Negative for congestion.   Eyes: Negative for blurred vision.  Respiratory: Negative for cough and shortness of breath.   Cardiovascular: Negative for chest pain, palpitations and leg swelling.  Gastrointestinal: Negative for vomiting.  Musculoskeletal: Negative for back pain.  Skin: Negative for rash.  Neurological: Negative for loss of consciousness and headaches.       Objective:    Physical Exam  Constitutional: She is oriented to person, place, and time. She appears well-developed and well-nourished. No distress.  HENT:  Head: Normocephalic and atraumatic.  Eyes: Conjunctivae are normal.  Neck: Normal range of motion. No thyromegaly present.  Cardiovascular: Normal rate and regular rhythm.   Pulmonary/Chest: Effort normal and breath sounds normal. She has no wheezes.  Abdominal: Soft. Bowel sounds are normal. There is no tenderness.  Musculoskeletal: Normal range of motion. She exhibits no edema or deformity.  Neurological: She is alert and oriented to person, place, and time.  Skin: Skin is warm and dry. She is not diaphoretic.  Psychiatric: She has a normal mood and affect.    BP 110/68 (BP Location: Left Arm, Patient Position: Sitting, Cuff Size: Normal)   Pulse 72   Temp 97.7 F (36.5 C) (Oral)   Resp 18   Wt 175 lb 6.4 oz (79.6 kg)   SpO2 98%   BMI 31.07 kg/m  Wt Readings from Last 3 Encounters:  09/27/16 175 lb 6.4 oz (79.6 kg)  02/18/16 172 lb 6.4 oz (78.2 kg)  11/10/15 175 lb 12.8 oz (79.7  kg)   BP Readings from Last 3 Encounters:  09/27/16 110/68  02/18/16 122/70  01/24/16 174/83     Immunization History  Administered Date(s) Administered  . Hepatitis B 04/10/2008, 05/13/2008, 12/05/2008  . Influenza Split 11/25/2011  . Influenza Whole 01/18/2007, 12/06/2007, 12/22/2008, 11/17/2009  . Influenza,inj,Quad PF,36+ Mos 12/13/2012, 12/09/2013  . Influenza-Unspecified 01/20/2015  . Pneumococcal Conjugate-13 05/08/2015  . Pneumococcal Polysaccharide-23 07/11/2006, 09/04/2012  . Tdap 11/25/2011  .  Zoster 01/14/2008    Health Maintenance  Topic Date Due  . DEXA SCAN  01/06/2001  . INFLUENZA VACCINE  10/19/2016  . TETANUS/TDAP  11/24/2021  . PNA vac Low Risk Adult  Completed    Lab Results  Component Value Date   WBC 6.7 02/18/2016   HGB 13.8 02/18/2016   HCT 41.0 02/18/2016   PLT 280.0 02/18/2016   GLUCOSE 102 (H) 02/18/2016   CHOL 206 (H) 02/18/2016   TRIG 160.0 (H) 02/18/2016   HDL 66.10 02/18/2016   LDLDIRECT 146.0 11/10/2015   LDLCALC 108 (H) 02/18/2016   ALT 15 02/18/2016   AST 19 02/18/2016   NA 141 02/18/2016   K 4.0 02/18/2016   CL 105 02/18/2016   CREATININE 1.13 02/18/2016   BUN 13 02/18/2016   CO2 28 02/18/2016   TSH 2.17 02/18/2016   INR 1.15 12/21/2011   HGBA1C 6.1 02/18/2016    Lab Results  Component Value Date   TSH 2.17 02/18/2016   Lab Results  Component Value Date   WBC 6.7 02/18/2016   HGB 13.8 02/18/2016   HCT 41.0 02/18/2016   MCV 87.4 02/18/2016   PLT 280.0 02/18/2016   Lab Results  Component Value Date   NA 141 02/18/2016   K 4.0 02/18/2016   CO2 28 02/18/2016   GLUCOSE 102 (H) 02/18/2016   BUN 13 02/18/2016   CREATININE 1.13 02/18/2016   BILITOT 0.6 02/18/2016   ALKPHOS 90 02/18/2016   AST 19 02/18/2016   ALT 15 02/18/2016   PROT 7.7 02/18/2016   ALBUMIN 4.3 02/18/2016   CALCIUM 9.8 02/18/2016   GFR 49.23 (L) 02/18/2016   Lab Results  Component Value Date   CHOL 206 (H) 02/18/2016   Lab Results    Component Value Date   HDL 66.10 02/18/2016   Lab Results  Component Value Date   LDLCALC 108 (H) 02/18/2016   Lab Results  Component Value Date   TRIG 160.0 (H) 02/18/2016   Lab Results  Component Value Date   CHOLHDL 3 02/18/2016   Lab Results  Component Value Date   HGBA1C 6.1 02/18/2016         Assessment & Plan:   Problem List Items Addressed This Visit    Hyperlipidemia    Tolerating statin, encouraged heart healthy diet, avoid trans fats, minimize simple carbs and saturated fats. Increase exercise as tolerated      Relevant Orders   Lipid panel   Essential hypertension    Well controlled, no changes to meds. Encouraged heart healthy diet such as the DASH diet and exercise as tolerated.       Relevant Orders   CBC   Comprehensive metabolic panel   TSH   HYPERGLYCEMIA    hgba1c acceptable, minimize simple carbs. Increase exercise as tolerated.       Relevant Orders   Hemoglobin A1c   Gout    Patient does not recall any gout flares and her uric acid in 2016 was 5.8. Will d/c Allopurinol and monitor      Relevant Orders   Uric acid   Obesity    Encouraged DASH diet, decrease po intake and increase exercise as tolerated. Needs 7-8 hours of sleep nightly. Avoid trans fats, eat small, frequent meals every 4-5 hours with lean proteins, complex carbs and healthy fats. Minimize simple carbs, declines bariatric referral         I have discontinued Ms. Pfefferle's BYSTOLIC, vitamin B-12, and allopurinol. I am also having her  maintain her aspirin, azelastine, estropipate, clopidogrel, amLODipine, nitroGLYCERIN, pravastatin, and citalopram. We will stop administering ipratropium.  No orders of the defined types were placed in this encounter.   CMA served as Neurosurgeon during this visit. History, Physical and Plan performed by medical provider. Documentation and orders reviewed and attested to.  Danise Edge, MD

## 2016-10-28 DIAGNOSIS — I2510 Atherosclerotic heart disease of native coronary artery without angina pectoris: Secondary | ICD-10-CM | POA: Diagnosis not present

## 2016-10-28 DIAGNOSIS — I10 Essential (primary) hypertension: Secondary | ICD-10-CM | POA: Diagnosis not present

## 2016-10-28 DIAGNOSIS — R7309 Other abnormal glucose: Secondary | ICD-10-CM | POA: Diagnosis not present

## 2016-10-28 DIAGNOSIS — E785 Hyperlipidemia, unspecified: Secondary | ICD-10-CM | POA: Diagnosis not present

## 2016-10-28 DIAGNOSIS — M109 Gout, unspecified: Secondary | ICD-10-CM | POA: Diagnosis not present

## 2016-10-31 DIAGNOSIS — I10 Essential (primary) hypertension: Secondary | ICD-10-CM | POA: Diagnosis not present

## 2016-10-31 DIAGNOSIS — M109 Gout, unspecified: Secondary | ICD-10-CM | POA: Diagnosis not present

## 2016-10-31 DIAGNOSIS — E785 Hyperlipidemia, unspecified: Secondary | ICD-10-CM | POA: Diagnosis not present

## 2016-11-15 ENCOUNTER — Other Ambulatory Visit: Payer: Self-pay | Admitting: Family Medicine

## 2016-11-24 ENCOUNTER — Other Ambulatory Visit: Payer: Self-pay | Admitting: Family Medicine

## 2017-01-25 DIAGNOSIS — I2510 Atherosclerotic heart disease of native coronary artery without angina pectoris: Secondary | ICD-10-CM | POA: Diagnosis not present

## 2017-01-25 DIAGNOSIS — M109 Gout, unspecified: Secondary | ICD-10-CM | POA: Diagnosis not present

## 2017-01-25 DIAGNOSIS — E785 Hyperlipidemia, unspecified: Secondary | ICD-10-CM | POA: Diagnosis not present

## 2017-01-25 DIAGNOSIS — I10 Essential (primary) hypertension: Secondary | ICD-10-CM | POA: Diagnosis not present

## 2017-01-25 DIAGNOSIS — R7309 Other abnormal glucose: Secondary | ICD-10-CM | POA: Diagnosis not present

## 2017-02-17 DIAGNOSIS — H35033 Hypertensive retinopathy, bilateral: Secondary | ICD-10-CM | POA: Diagnosis not present

## 2017-02-17 DIAGNOSIS — Z961 Presence of intraocular lens: Secondary | ICD-10-CM | POA: Diagnosis not present

## 2017-02-17 DIAGNOSIS — H26493 Other secondary cataract, bilateral: Secondary | ICD-10-CM | POA: Diagnosis not present

## 2017-02-17 DIAGNOSIS — H35373 Puckering of macula, bilateral: Secondary | ICD-10-CM | POA: Diagnosis not present

## 2017-04-03 ENCOUNTER — Other Ambulatory Visit: Payer: Self-pay | Admitting: Family Medicine

## 2017-04-17 ENCOUNTER — Other Ambulatory Visit: Payer: Self-pay | Admitting: Family Medicine

## 2017-04-28 DIAGNOSIS — I2510 Atherosclerotic heart disease of native coronary artery without angina pectoris: Secondary | ICD-10-CM | POA: Diagnosis not present

## 2017-04-28 DIAGNOSIS — M109 Gout, unspecified: Secondary | ICD-10-CM | POA: Diagnosis not present

## 2017-04-28 DIAGNOSIS — L209 Atopic dermatitis, unspecified: Secondary | ICD-10-CM | POA: Diagnosis not present

## 2017-04-28 DIAGNOSIS — R7309 Other abnormal glucose: Secondary | ICD-10-CM | POA: Diagnosis not present

## 2017-04-28 DIAGNOSIS — E785 Hyperlipidemia, unspecified: Secondary | ICD-10-CM | POA: Diagnosis not present

## 2017-04-28 DIAGNOSIS — I10 Essential (primary) hypertension: Secondary | ICD-10-CM | POA: Diagnosis not present

## 2017-05-30 DIAGNOSIS — Z01419 Encounter for gynecological examination (general) (routine) without abnormal findings: Secondary | ICD-10-CM | POA: Diagnosis not present

## 2017-05-30 DIAGNOSIS — Z1231 Encounter for screening mammogram for malignant neoplasm of breast: Secondary | ICD-10-CM | POA: Diagnosis not present

## 2017-08-21 ENCOUNTER — Other Ambulatory Visit: Payer: Self-pay | Admitting: Family Medicine

## 2017-08-31 ENCOUNTER — Encounter: Payer: Self-pay | Admitting: Family Medicine

## 2017-08-31 ENCOUNTER — Ambulatory Visit (INDEPENDENT_AMBULATORY_CARE_PROVIDER_SITE_OTHER): Payer: Medicare HMO | Admitting: Family Medicine

## 2017-08-31 DIAGNOSIS — M109 Gout, unspecified: Secondary | ICD-10-CM | POA: Diagnosis not present

## 2017-08-31 DIAGNOSIS — I10 Essential (primary) hypertension: Secondary | ICD-10-CM

## 2017-08-31 DIAGNOSIS — E782 Mixed hyperlipidemia: Secondary | ICD-10-CM | POA: Diagnosis not present

## 2017-08-31 DIAGNOSIS — R7309 Other abnormal glucose: Secondary | ICD-10-CM

## 2017-08-31 MED ORDER — PRAVASTATIN SODIUM 40 MG PO TABS: 40.0000 mg | ORAL_TABLET | Freq: Every day | ORAL | 1 refills | Status: DC

## 2017-08-31 NOTE — Assessment & Plan Note (Signed)
Is tolerating Allopurinol check uric acid level

## 2017-08-31 NOTE — Progress Notes (Signed)
Subjective:  I acted as a Neurosurgeon for Dr. Abner Greenspan. [name], [place]  Patient ID: Carolyn Adams, female    DOB: 1934/03/11, 84 y.o.   MRN: [id number]  No chief complaint on file.   HPI  Patient is in today to discuss medications. She feels well today. No recent febrile illness or hospitalizaitons. She is no longer seeing cardiology. Needs refills on her meds. No polyuria or polydipsia. Denies CP/palp/SOB/HA/congestion/fevers/GI or GU c/o. Taking meds as prescribed  Patient Care Team: Bradd Canary, MD as PCP - General (Family Medicine) Dr Payton Doughty  (GYN) (Gynecology) Mardella Layman, MD (Gastroenterology) Rinaldo Cloud, MD as Consulting Physician (Cardiology)   Past Medical History:  Diagnosis Date  . Allergy   . Anginal pain (HCC) 12/21/2011  . Chronic headaches    "but not all the time" (12/21/2011)  . Chronic sinus infection    "terrible" (12/21/2011)  . Constipation 05/17/2015  . H/O measles   . H/O mumps   . Headache 12/14/2014  . History of chicken pox   . Hyperlipidemia   . Hypertension   . Memory loss 02/28/2016  . Osteoarthritis    "hands" (12/21/2011)  . Vertigo     Past Surgical History:  Procedure Laterality Date  . ABDOMINAL HYSTERECTOMY  1970's   partial,   . CARDIAC CATHETERIZATION N/A 06/02/2015   Procedure: Left Heart Cath and Coronary Angiography;  Surgeon: Rinaldo Cloud, MD;  Location: [facility] INVASIVE CV LAB;  Service: Cardiovascular;  Laterality: N/A;  . LEFT HEART CATHETERIZATION WITH CORONARY ANGIOGRAM N/A 12/22/2011   Procedure: LEFT HEART CATHETERIZATION WITH CORONARY ANGIOGRAM;  Surgeon: Robynn Pane, MD;  Location: [facility] CATH LAB;  Service: Cardiovascular;  Laterality: N/A;  . NASAL SEPTUM SURGERY  01/24/13   [facility]    Family History  Problem Relation Age of Onset  . Cancer Sister   . Coronary artery disease Brother        s/p 3 stents  . Heart disease Brother        s/p 3 stents  . Arthritis Brother   . Heart attack Son   . Heart  disease Son   . Cancer Mother        breast  . Hypertension Other   . Cancer Other        lung  . Diabetes Neg Hx   . Colon cancer Neg Hx     Social History   Socioeconomic History  . Marital status: Widowed    Spouse name: Not on file  . Number of children: Not on file  . Years of education: Not on file  . Highest education level: Not on file  Occupational History  . Occupation: Information systems manager Care Asst    Employer: RETIRED  Social Needs  . Financial resource strain: Not on file  . Food insecurity:    Worry: Not on file    Inability: Not on file  . Transportation needs:    Medical: Not on file    Non-medical: Not on file  Tobacco Use  . Smoking status: Never Smoker  . Smokeless tobacco: Never Used  Substance and Sexual Activity  . Alcohol use: No  . Drug use: No  . Sexual activity: Never    Comment: lives alone, follows heart healthy. 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 Physical activity:    Days per week: Not on file    Minutes per session: Not on file  . Stress: Not on file  Relationships  .  Social connections:    Talks on phone: Not on file    Gets together: Not on file    Attends religious service: Not on file    Active member of club or organization: Not on file    Attends meetings of clubs or organizations: Not on file    Relationship status: Not on file  . Intimate partner violence:    Fear of current or ex partner: Not on file    Emotionally abused: Not on file    Physically abused: Not on file    Forced sexual activity: Not on file  Other Topics Concern  . Not on file  Social History Narrative   Physician roster   Ophthalmologist-Dr. Elmer Picker   ENT- Dr. Annalee Genta    Outpatient Medications Prior to Visit  Medication Sig Dispense Refill  . allopurinol (ZYLOPRIM) 100 MG tablet TAKE 1 TABLET EVERY DAY 90 tablet 1  . aspirin 81 MG EC tablet Take 81 mg by mouth daily.      . citalopram (CELEXA) 10 MG tablet TAKE 1 TABLET EVERY DAY  90 tablet 1  . clopidogrel (PLAVIX) 75 MG tablet Take 1 tablet by mouth daily.  0  . pravastatin (PRAVACHOL) 40 MG tablet TAKE 1 TABLET EVERY DAY 90 tablet 1  . amLODipine (NORVASC) 2.5 MG tablet Take 2.5 mg by mouth daily.   0  . azelastine (ASTELIN) 137 MCG/SPRAY nasal spray Place 2 sprays into the nose 2 (two) times daily. Use in each nostril as directed 30 mL 5  . estropipate (OGEN) 0.75 MG tablet Take 1 tablet by mouth daily. Reported on 05/08/2015  0  . nitroGLYCERIN (NITROSTAT) 0.4 MG SL tablet Place 1 tablet under the tongue as needed for chest pressure that does not resolve in 10 minutes. 10 tablet 0   No facility-administered medications prior to visit.     Allergies  Allergen Reactions  . Methylprednisolone Palpitations    Chest pain  . Ampicillin Other (See Comments)    REACTION: Chest Pain  . Hydrocodone-Acetaminophen Nausea And Vomiting  . Olmesartan Medoxomil Nausea Only    REACTION: stomach upse    Review of Systems  Constitutional: Negative for fever and malaise/fatigue.  HENT: Negative for congestion.   Eyes: Negative for blurred vision.  Respiratory: Negative for shortness of breath.   Cardiovascular: Negative for chest pain, palpitations and leg swelling.  Gastrointestinal: Negative for abdominal pain, blood in stool and nausea.  Genitourinary: Negative for dysuria and frequency.  Musculoskeletal: Negative for falls.  Skin: Negative for rash.  Neurological: Negative for dizziness, loss of consciousness and headaches.  Endo/Heme/Allergies: Negative for environmental allergies.  Psychiatric/Behavioral: Negative for depression. The patient is not nervous/anxious.        Objective:    Physical Exam  Constitutional: She is oriented to person, place, and time. No distress.  HENT:  Head: Normocephalic and atraumatic.  Eyes: Conjunctivae are normal.  Neck: Neck supple. No thyromegaly present.  Cardiovascular: Normal rate, regular rhythm and normal heart  sounds.  No murmur heard. Pulmonary/Chest: Effort normal and breath sounds normal. She has no wheezes.  Abdominal: She exhibits no distension and no mass.  Musculoskeletal: She exhibits no edema.  Lymphadenopathy:    She has no cervical adenopathy.  Neurological: She is alert and oriented to person, place, and time.  Skin: Skin is warm and dry. No rash noted. She is not diaphoretic.  Psychiatric: Judgment normal.    BP 122/76 (BP Location: Left Arm, Patient Position: Sitting, Cuff Size:  Normal)   Pulse 67   Temp 97.7 F (36.5 C) (Oral)   Resp 18   Wt 179 lb 3.2 oz (81.3 kg)   SpO2 97%   BMI 31.74 kg/m  Wt Readings from Last 3 Encounters:  08/31/17 179 lb 3.2 oz (81.3 kg)  09/27/16 175 lb 6.4 oz (79.6 kg)  02/18/16 172 lb 6.4 oz (78.2 kg)   BP Readings from Last 3 Encounters:  08/31/17 122/76  09/27/16 110/68  02/18/16 122/70     Immunization History  Administered Date(s) Administered  . Hepatitis B 04/10/2008, 05/13/2008, 12/05/2008  . Influenza Split 11/25/2011  . Influenza Whole 01/18/2007, 12/06/2007, 12/22/2008, 11/17/2009  . Influenza,inj,Quad PF,6+ Mos 12/13/2012, 12/09/2013  . Influenza-Unspecified 01/20/2015  . Pneumococcal Conjugate-13 05/08/2015  . Pneumococcal Polysaccharide-23 07/11/2006, 09/04/2012  . Tdap 11/25/2011  . Zoster 01/14/2008    Health Maintenance  Topic Date Due  . DEXA SCAN  01/06/2001  . INFLUENZA VACCINE  10/19/2017  . TETANUS/TDAP  11/24/2021  . PNA vac Low Risk Adult  Completed    Lab Results  Component Value Date   WBC 7.5 08/31/2017   HGB 13.3 08/31/2017   HCT 39.1 08/31/2017   PLT 280.0 08/31/2017   GLUCOSE 92 08/31/2017   CHOL 253 (H) 08/31/2017   TRIG 395.0 (H) 08/31/2017   HDL 54.90 08/31/2017   LDLDIRECT 135.0 08/31/2017   LDLCALC 108 (H) 02/18/2016   ALT 16 08/31/2017   AST 19 08/31/2017   NA 140 08/31/2017   K 4.1 08/31/2017   CL 103 08/31/2017   CREATININE 1.07 08/31/2017   BUN 26 (H) 08/31/2017   CO2 27  08/31/2017   TSH 2.74 08/31/2017   INR 1.15 12/21/2011   HGBA1C 6.5 08/31/2017    Lab Results  Component Value Date   TSH 2.74 08/31/2017   Lab Results  Component Value Date   WBC 7.5 08/31/2017   HGB 13.3 08/31/2017   HCT 39.1 08/31/2017   MCV 89.8 08/31/2017   PLT 280.0 08/31/2017   Lab Results  Component Value Date   NA 140 08/31/2017   K 4.1 08/31/2017   CO2 27 08/31/2017   GLUCOSE 92 08/31/2017   BUN 26 (H) 08/31/2017   CREATININE 1.07 08/31/2017   BILITOT 0.3 08/31/2017   ALKPHOS 95 08/31/2017   AST 19 08/31/2017   ALT 16 08/31/2017   PROT 7.2 08/31/2017   ALBUMIN 4.1 08/31/2017   CALCIUM 9.8 08/31/2017   GFR 52.23 (L) 08/31/2017   Lab Results  Component Value Date   CHOL 253 (H) 08/31/2017   Lab Results  Component Value Date   HDL 54.90 08/31/2017   Lab Results  Component Value Date   LDLCALC 108 (H) 02/18/2016   Lab Results  Component Value Date   TRIG 395.0 (H) 08/31/2017   Lab Results  Component Value Date   CHOLHDL 5 08/31/2017   Lab Results  Component Value Date   HGBA1C 6.5 08/31/2017         Assessment & Plan:   Problem List Items Addressed This Visit    Hyperlipidemia    Tolerating statin, encouraged heart healthy diet, avoid trans fats, minimize simple carbs and saturated fats. Increase exercise as tolerated      Relevant Medications   pravastatin (PRAVACHOL) 40 MG tablet   Other Relevant Orders   Lipid panel (Completed)   Essential hypertension    Well controlled, no changes to meds. Encouraged heart healthy diet such as the DASH diet and exercise as tolerated.  Relevant Medications   pravastatin (PRAVACHOL) 40 MG tablet   Other Relevant Orders   CBC (Completed)   Comprehensive metabolic panel (Completed)   TSH (Completed)   HYPERGLYCEMIA    hgba1c acceptable, minimize simple carbs. Increase exercise as tolerated.       Relevant Orders   Hemoglobin A1c (Completed)   Gout    Is tolerating Allopurinol check  uric acid level         I have discontinued Samya Puls's azelastine, estropipate, amLODipine, and nitroGLYCERIN. I have also changed her pravastatin. Additionally, I am having her maintain her aspirin, clopidogrel, allopurinol, and citalopram.  Meds ordered this encounter  Medications  . pravastatin (PRAVACHOL) 40 MG tablet    Sig: Take 1 tablet (40 mg total) by mouth daily.    Dispense:  90 tablet    Refill:  1    CMA served as Neurosurgeonscribe during this visit. History, Physical and Plan performed by medical provider. Documentation and orders reviewed and attested to.  Danise EdgeStacey Mirabelle Cyphers, MD

## 2017-08-31 NOTE — Assessment & Plan Note (Signed)
Well controlled, no changes to meds. Encouraged heart healthy diet such as the DASH diet and exercise as tolerated.  °

## 2017-08-31 NOTE — Assessment & Plan Note (Signed)
Tolerating statin, encouraged heart healthy diet, avoid trans fats, minimize simple carbs and saturated fats. Increase exercise as tolerated 

## 2017-08-31 NOTE — Patient Instructions (Addendum)
Consider a fiber supplement daily in a beverage. Call for referral to gastroenterology if bowel habits get worse or pianful Hypertension Hypertension, commonly called high blood pressure, is when the force of blood pumping through the arteries is too strong. The arteries are the blood vessels that carry blood from the heart throughout the body. Hypertension forces the heart to work harder to pump blood and may cause arteries to become narrow or stiff. Having untreated or uncontrolled hypertension can cause heart attacks, strokes, kidney disease, and other problems. A blood pressure reading consists of a higher number over a lower number. Ideally, your blood pressure should be below 120/80. The first ("top") number is called the systolic pressure. It is a measure of the pressure in your arteries as your heart beats. The second ("bottom") number is called the diastolic pressure. It is a measure of the pressure in your arteries as the heart relaxes. What are the causes? The cause of this condition is not known. What increases the risk? Some risk factors for high blood pressure are under your control. Others are not. Factors you can change  Smoking.  Having type 2 diabetes mellitus, high cholesterol, or both.  Not getting enough exercise or physical activity.  Being overweight.  Having too much fat, sugar, calories, or salt (sodium) in your diet.  Drinking too much alcohol. Factors that are difficult or impossible to change  Having chronic kidney disease.  Having a family history of high blood pressure.  Age. Risk increases with age.  Race. You may be at higher risk if you are African-American.  Gender. Men are at higher risk than women before age 82. After age 82, women are at higher risk than men.  Having obstructive sleep apnea.  Stress. What are the signs or symptoms? Extremely high blood pressure (hypertensive crisis) may cause:  Headache.  Anxiety.  Shortness of  breath.  Nosebleed.  Nausea and vomiting.  Severe chest pain.  Jerky movements you cannot control (seizures).  How is this diagnosed? This condition is diagnosed by measuring your blood pressure while you are seated, with your arm resting on a surface. The cuff of the blood pressure monitor will be placed directly against the skin of your upper arm at the level of your heart. It should be measured at least twice using the same arm. Certain conditions can cause a difference in blood pressure between your right and left arms. Certain factors can cause blood pressure readings to be lower or higher than normal (elevated) for a short period of time:  When your blood pressure is higher when you are in a health care provider's office than when you are at home, this is called white coat hypertension. Most people with this condition do not need medicines.  When your blood pressure is higher at home than when you are in a health care provider's office, this is called masked hypertension. Most people with this condition may need medicines to control blood pressure.  If you have a high blood pressure reading during one visit or you have normal blood pressure with other risk factors:  You may be asked to return on a different day to have your blood pressure checked again.  You may be asked to monitor your blood pressure at home for 1 week or longer.  If you are diagnosed with hypertension, you may have other blood or imaging tests to help your health care provider understand your overall risk for other conditions. How is this treated? This condition  is treated by making healthy lifestyle changes, such as eating healthy foods, exercising more, and reducing your alcohol intake. Your health care provider may prescribe medicine if lifestyle changes are not enough to get your blood pressure under control, and if:  Your systolic blood pressure is above 130.  Your diastolic blood pressure is above  80.  Your personal target blood pressure may vary depending on your medical conditions, your age, and other factors. Follow these instructions at home: Eating and drinking  Eat a diet that is high in fiber and potassium, and low in sodium, added sugar, and fat. An example eating plan is called the DASH (Dietary Approaches to Stop Hypertension) diet. To eat this way: ? Eat plenty of fresh fruits and vegetables. Try to fill half of your plate at each meal with fruits and vegetables. ? Eat whole grains, such as whole wheat pasta, brown rice, or whole grain bread. Fill about one quarter of your plate with whole grains. ? Eat or drink low-fat dairy products, such as skim milk or low-fat yogurt. ? Avoid fatty cuts of meat, processed or cured meats, and poultry with skin. Fill about one quarter of your plate with lean proteins, such as fish, chicken without skin, beans, eggs, and tofu. ? Avoid premade and processed foods. These tend to be higher in sodium, added sugar, and fat.  Reduce your daily sodium intake. Most people with hypertension should eat less than 1,500 mg of sodium a day.  Limit alcohol intake to no more than 1 drink a day for nonpregnant women and 2 drinks a day for men. One drink equals 12 oz of beer, 5 oz of wine, or 1 oz of hard liquor. Lifestyle  Work with your health care provider to maintain a healthy body weight or to lose weight. Ask what an ideal weight is for you.  Get at least 30 minutes of exercise that causes your heart to beat faster (aerobic exercise) most days of the week. Activities may include walking, swimming, or biking.  Include exercise to strengthen your muscles (resistance exercise), such as pilates or lifting weights, as part of your weekly exercise routine. Try to do these types of exercises for 30 minutes at least 3 days a week.  Do not use any products that contain nicotine or tobacco, such as cigarettes and e-cigarettes. If you need help quitting, ask  your health care provider.  Monitor your blood pressure at home as told by your health care provider.  Keep all follow-up visits as told by your health care provider. This is important. Medicines  Take over-the-counter and prescription medicines only as told by your health care provider. Follow directions carefully. Blood pressure medicines must be taken as prescribed.  Do not skip doses of blood pressure medicine. Doing this puts you at risk for problems and can make the medicine less effective.  Ask your health care provider about side effects or reactions to medicines that you should watch for. Contact a health care provider if:  You think you are having a reaction to a medicine you are taking.  You have headaches that keep coming back (recurring).  You feel dizzy.  You have swelling in your ankles.  You have trouble with your vision. Get help right away if:  You develop a severe headache or confusion.  You have unusual weakness or numbness.  You feel faint.  You have severe pain in your chest or abdomen.  You vomit repeatedly.  You have trouble breathing. Summary  Hypertension is when the force of blood pumping through your arteries is too strong. If this condition is not controlled, it may put you at risk for serious complications.  Your personal target blood pressure may vary depending on your medical conditions, your age, and other factors. For most people, a normal blood pressure is less than 120/80.  Hypertension is treated with lifestyle changes, medicines, or a combination of both. Lifestyle changes include weight loss, eating a healthy, low-sodium diet, exercising more, and limiting alcohol. This information is not intended to replace advice given to you by your health care provider. Make sure you discuss any questions you have with your health care provider. Document Released: 03/07/2005 Document Revised: 02/03/2016 Document Reviewed: 02/03/2016 Elsevier  Interactive Patient Education  [employer].

## 2017-08-31 NOTE — Assessment & Plan Note (Signed)
hgba1c acceptable, minimize simple carbs. Increase exercise as tolerated.  

## 2017-09-01 LAB — CBC
HCT: 39.1 % (ref 36.0–46.0)
HEMOGLOBIN: 13.3 g/dL (ref 12.0–15.0)
MCHC: 33.9 g/dL (ref 30.0–36.0)
MCV: 89.8 fl (ref 78.0–100.0)
PLATELETS: 280 10*3/uL (ref 150.0–400.0)
RBC: 4.35 Mil/uL (ref 3.87–5.11)
RDW: 13.3 % (ref 11.5–15.5)
WBC: 7.5 10*3/uL (ref 4.0–10.5)

## 2017-09-01 LAB — COMPREHENSIVE METABOLIC PANEL
ALK PHOS: 95 U/L (ref 39–117)
ALT: 16 U/L (ref 0–35)
AST: 19 U/L (ref 0–37)
Albumin: 4.1 g/dL (ref 3.5–5.2)
BILIRUBIN TOTAL: 0.3 mg/dL (ref 0.2–1.2)
BUN: 26 mg/dL — ABNORMAL HIGH (ref 6–23)
CALCIUM: 9.8 mg/dL (ref 8.4–10.5)
CO2: 27 meq/L (ref 19–32)
Chloride: 103 mEq/L (ref 96–112)
Creatinine, Ser: 1.07 mg/dL (ref 0.40–1.20)
GFR: 52.23 mL/min — ABNORMAL LOW (ref >60.00)
GLUCOSE: 92 mg/dL (ref 70–99)
POTASSIUM: 4.1 meq/L (ref 3.5–5.1)
Sodium: 140 mEq/L (ref 135–145)
Total Protein: 7.2 g/dL (ref 6.0–8.3)

## 2017-09-01 LAB — HEMOGLOBIN A1C: HEMOGLOBIN A1C: 6.5 % (ref 4.6–6.5)

## 2017-09-01 LAB — LDL CHOLESTEROL, DIRECT: Direct LDL: 135 mg/dL

## 2017-09-01 LAB — LIPID PANEL
Cholesterol: 253 mg/dL — ABNORMAL HIGH (ref 0–200)
HDL: 54.9 mg/dL (ref >39.00)
NONHDL: 198.01
TRIGLYCERIDES: 395 mg/dL — AB (ref 0.0–149.0)
Total CHOL/HDL Ratio: 5
VLDL: 79 mg/dL — AB (ref 0.0–40.0)

## 2017-09-01 LAB — TSH: TSH: 2.74 u[IU]/mL (ref 0.35–4.50)

## 2017-09-07 DIAGNOSIS — E785 Hyperlipidemia, unspecified: Secondary | ICD-10-CM | POA: Diagnosis not present

## 2017-09-07 DIAGNOSIS — M109 Gout, unspecified: Secondary | ICD-10-CM | POA: Diagnosis not present

## 2017-09-07 DIAGNOSIS — I2510 Atherosclerotic heart disease of native coronary artery without angina pectoris: Secondary | ICD-10-CM | POA: Diagnosis not present

## 2017-09-07 DIAGNOSIS — I10 Essential (primary) hypertension: Secondary | ICD-10-CM | POA: Diagnosis not present

## 2017-09-07 DIAGNOSIS — L239 Allergic contact dermatitis, unspecified cause: Secondary | ICD-10-CM | POA: Diagnosis not present

## 2017-09-07 DIAGNOSIS — R7309 Other abnormal glucose: Secondary | ICD-10-CM | POA: Diagnosis not present

## 2017-09-14 DIAGNOSIS — I10 Essential (primary) hypertension: Secondary | ICD-10-CM | POA: Diagnosis not present

## 2017-09-14 DIAGNOSIS — M109 Gout, unspecified: Secondary | ICD-10-CM | POA: Diagnosis not present

## 2017-09-14 DIAGNOSIS — R7309 Other abnormal glucose: Secondary | ICD-10-CM | POA: Diagnosis not present

## 2017-09-14 DIAGNOSIS — E785 Hyperlipidemia, unspecified: Secondary | ICD-10-CM | POA: Diagnosis not present

## 2017-11-14 ENCOUNTER — Ambulatory Visit (INDEPENDENT_AMBULATORY_CARE_PROVIDER_SITE_OTHER): Payer: Medicare HMO | Admitting: Family Medicine

## 2017-11-14 ENCOUNTER — Other Ambulatory Visit: Payer: Self-pay | Admitting: Family Medicine

## 2017-11-14 VITALS — BP 138/76 | HR 78 | Temp 97.6°F | Resp 18 | Wt 174.2 lb

## 2017-11-14 DIAGNOSIS — I10 Essential (primary) hypertension: Secondary | ICD-10-CM

## 2017-11-14 DIAGNOSIS — R7309 Other abnormal glucose: Secondary | ICD-10-CM

## 2017-11-14 DIAGNOSIS — K219 Gastro-esophageal reflux disease without esophagitis: Secondary | ICD-10-CM

## 2017-11-14 DIAGNOSIS — F0390 Unspecified dementia without behavioral disturbance: Secondary | ICD-10-CM | POA: Diagnosis not present

## 2017-11-14 DIAGNOSIS — R413 Other amnesia: Secondary | ICD-10-CM

## 2017-11-14 DIAGNOSIS — E782 Mixed hyperlipidemia: Secondary | ICD-10-CM | POA: Diagnosis not present

## 2017-11-14 DIAGNOSIS — R0789 Other chest pain: Secondary | ICD-10-CM

## 2017-11-14 MED ORDER — DONEPEZIL HCL 5 MG PO TABS: ORAL_TABLET | ORAL | 0 refills | Status: DC

## 2017-11-14 NOTE — Patient Instructions (Signed)
Can titrate off the Citalopram by taking 1/2 tab daily for 2 weeks then dropping to 1/2 tab every other day til gone then stop Dementia Dementia is the loss of two or more brain functions, such as:  Memory.  Decision making.  Behavior.  Speaking.  Thinking.  Problem solving.  There are many types of dementia. The most common type is called progressive dementia. Progressive dementia gets worse with time and it is irreversible. An example of this type of dementia is Alzheimer disease. What are the causes? This condition may be caused by:  Nerve cell damage in the brain.  Genetic mutations.  Certain medicines.  Multiple small strokes.  An infection, such as chronic meningitis.  A metabolic problem, such as vitamin B12 deficiency or thyroid disease.  Pressure on the brain, such as from a tumor or blood clot.  What are the signs or symptoms? Symptoms of this condition include:  Sudden changes in mood.  Depression.  Problems with balance.  Changes in personality.  Poor short-term memory.  Agitation.  Delusions.  Hallucinations.  Having a hard time: ? Speaking thoughts. ? Finding words. ? Solving problems. ? Doing familiar tasks. ? Understanding familiar ideas.  How is this diagnosed? This condition is diagnosed with an assessment by your health care provider. During this assessment, your health care provider will talk with you and your family, friends, or caregivers about your symptoms. A thorough medical history will be taken, and you will have a physical exam and tests. Tests may include:  Lab tests, such as blood or urine tests.  Imaging tests, such as a CT scan, PET scan, or MRI.  A lumbar puncture. This test involves removing and testing a small amount of the fluid that surrounds the brain and spinal cord.  An electroencephalogram (EEG). In this test, small metal discs are used to measure electrical activity in the brain.  Memory tests, cognitive  tests, and neuropsychological tests. These tests evaluate brain function.  How is this treated? Treatment depends on the cause of the dementia. It may involve taking medicines that may help:  To control the dementia.  To slow down the disease.  To manage symptoms.  In some cases, treating the cause of the dementia can improve symptoms, reverse symptoms, or slow down how quickly the dementia gets worse. Your health care provider can help direct you to support groups, organizations, and other health care providers who can help with decisions about your care. Follow these instructions at home: Medicine  Take over-the-counter and prescription medicines only as told by your health care provider.  Avoid taking medicines that can affect thinking, such as pain or sleeping medicines. Lifestyle   Make healthy lifestyle choices: ? Be physically active as told by your health care provider. ? Do not use any tobacco products, such as cigarettes, chewing tobacco, and e-cigarettes. If you need help quitting, ask your health care provider. ? Eat a healthy diet. ? Practice stress-management techniques when you get stressed. ? Stay social.  Drink enough fluid to keep your urine clear or pale yellow.  Make sure to get quality sleep. These tips can help you to get a good night's rest: ? Avoid napping during the day. ? Keep your sleeping area dark and cool. ? Avoid exercising during the few hours before you go to bed. ? Avoid caffeine products in the evening. General instructions  Work with your health care provider to determine what you need help with and what your safety needs are.  If you were given a bracelet that tracks your location, make sure to wear it.  Keep all follow-up visits as told by your health care provider. This is important. Contact a health care provider if:  You have any new symptoms.  You have problems with choking or swallowing.  You have any symptoms of a different  illness. Get help right away if:  You develop a fever.  You have new or worsening confusion.  You have new or worsening sleepiness.  You have a hard time staying awake.  You or your family members become concerned for your safety. This information is not intended to replace advice given to you by your health care provider. Make sure you discuss any questions you have with your health care provider. Document Released: 08/31/2000 Document Revised: 07/16/2015 Document Reviewed: 12/03/2014 Elsevier Interactive Patient Education  [employer].

## 2017-11-14 NOTE — Progress Notes (Signed)
Subjective:  I acted as a Neurosurgeonscribe for Dr. Abner GreenspanBlyth. [name], [place]MA  Patient ID: Carolyn Adams, female    DOB: 09/09/1933, 84 y.o.   MRN: [id number]  No chief complaint on file.   HPI  Patient is in today to discuss medication and she acknowledges she is having more and more trouble with her memory but she feels she still completes her ADLs well. No recent febile illness or hospitalizations. No polyruia or polydipsia. Denies CP/palp/SOB/HA/congestion/fevers/GI or GU c/o. Taking meds as prescribed Patient Care Team: Bradd CanaryBlyth, Dyrell Tuccillo A, MD as PCP - General (Family Medicine) Dr Payton DoughtyHorvette  (GYN) (Gynecology) Mardella LaymanPatterson, David R, MD (Gastroenterology) Rinaldo CloudHarwani, Mohan, MD as Consulting Physician (Cardiology)   Past Medical History:  Diagnosis Date  . Allergy   . Anginal pain (HCC) 12/21/2011  . Chronic headaches    "but not all the time" (12/21/2011)  . Chronic sinus infection    "terrible" (12/21/2011)  . Constipation 05/17/2015  . H/O measles   . H/O mumps   . Headache 12/14/2014  . History of chicken pox   . Hyperlipidemia   . Hypertension   . Memory loss 02/28/2016  . Osteoarthritis    "hands" (12/21/2011)  . Vertigo     Past Surgical History:  Procedure Laterality Date  . ABDOMINAL HYSTERECTOMY  1970's   partial,   . CARDIAC CATHETERIZATION N/A 06/02/2015   Procedure: Left Heart Cath and Coronary Angiography;  Surgeon: Rinaldo CloudMohan Harwani, MD;  [facility];  Service: Cardiovascular;  Laterality: N/A;  . LEFT HEART CATHETERIZATION WITH CORONARY ANGIOGRAM N/A 12/22/2011   Procedure: LEFT HEART CATHETERIZATION WITH CORONARY ANGIOGRAM;  Surgeon: Robynn PaneMohan N Harwani, MD;  Location: [facility] CATH LAB;  Service: Cardiovascular;  Laterality: N/A;  . NASAL SEPTUM SURGERY  01/24/13   [facility]    Family History  Problem Relation Age of Onset  . Cancer Sister   . Coronary artery disease Brother        s/p 3 stents  . Heart disease Brother        s/p 3 stents  . Arthritis Brother     . Heart attack Son   . Heart disease Son   . Cancer Mother        breast  . Hypertension Other   . Cancer Other        lung  . Diabetes Neg Hx   . Colon cancer Neg Hx     Social History   Socioeconomic History  . Marital status: Widowed    Spouse name: Not on file  . Number of children: Not on file  . Years of education: Not on file  . Highest education level: Not on file  Occupational History  . 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 Financial resource strain: Not on file  . Food insecurity:    Worry: Not on file    Inability: Not on file  . Transportation needs:    Medical: Not on file    Non-medical: Not on file  Tobacco Use  . Smoking status: Never Smoker  . Smokeless tobacco: Never Used  Substance and Sexual Activity  . Alcohol use: No  . Drug use: No  . Sexual activity: Never    Comment: lives alone, follows heart healthy. 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 Physical activity:    Days per week: Not on file    Minutes per session: Not on file  .  Stress: Not on file  Relationships  . Social connections:    Talks on phone: Not on file    Gets together: Not on file    Attends religious service: Not on file    Active member of club or organization: Not on file    Attends meetings of clubs or organizations: Not on file    Relationship status: Not on file  . Intimate partner violence:    Fear of current or ex partner: Not on file    Emotionally abused: Not on file    Physically abused: Not on file    Forced sexual activity: Not on file  Other Topics Concern  . Not on file  Social History Narrative   Physician roster   Ophthalmologist-Dr. Elmer Picker   ENT- Dr. Annalee Genta    Outpatient Medications Prior to Visit  Medication Sig Dispense Refill  . allopurinol (ZYLOPRIM) 100 MG tablet TAKE 1 TABLET EVERY DAY 90 tablet 1  . aspirin 81 MG EC tablet Take 81 mg by mouth daily.      . clopidogrel (PLAVIX) 75 MG  tablet Take 1 tablet by mouth daily.  0  . pravastatin (PRAVACHOL) 40 MG tablet Take 1 tablet (40 mg total) by mouth daily. 90 tablet 1  . citalopram (CELEXA) 10 MG tablet TAKE 1 TABLET EVERY DAY 90 tablet 1   No facility-administered medications prior to visit.     Allergies  Allergen Reactions  . Methylprednisolone Palpitations    Chest pain  . Ampicillin Other (See Comments)    REACTION: Chest Pain  . Hydrocodone-Acetaminophen Nausea And Vomiting  . Olmesartan Medoxomil Nausea Only    REACTION: stomach upse    Review of Systems  Constitutional: Negative for fever and malaise/fatigue.  HENT: Negative for congestion.   Eyes: Negative for blurred vision.  Respiratory: Negative for shortness of breath.   Cardiovascular: Negative for chest pain, palpitations and leg swelling.  Gastrointestinal: Negative for abdominal pain, blood in stool and nausea.  Genitourinary: Negative for dysuria and frequency.  Musculoskeletal: Negative for falls.  Skin: Negative for rash.  Neurological: Negative for dizziness, loss of consciousness and headaches.  Endo/Heme/Allergies: Negative for environmental allergies.  Psychiatric/Behavioral: Positive for memory loss. Negative for depression. The patient is not nervous/anxious.        Objective:    Physical Exam  Constitutional: She is oriented to person, place, and time. She appears well-developed and well-nourished. No distress.  HENT:  Head: Normocephalic and atraumatic.  Nose: Nose normal.  Eyes: Right eye exhibits no discharge. Left eye exhibits no discharge.  Neck: Normal range of motion. Neck supple.  Cardiovascular: Normal rate and regular rhythm.  No murmur heard. Pulmonary/Chest: Effort normal and breath sounds normal.  Abdominal: Soft. Bowel sounds are normal. There is no tenderness.  Musculoskeletal: She exhibits no edema.  Neurological: She is alert and oriented to person, place, and time.  Skin: Skin is warm and dry.    Psychiatric: She has a normal mood and affect.  MMSE 18/30  Nursing note and vitals reviewed.   BP 138/76 (BP Location: Left Arm, Patient Position: Sitting, Cuff Size: Normal)   Pulse 78   Temp 97.6 F (36.4 C) (Oral)   Resp 18   Wt 174 lb 3.2 oz (79 kg)   SpO2 96%   BMI 30.86 kg/m  Wt Readings from Last 3 Encounters:  11/14/17 174 lb 3.2 oz (79 kg)  08/31/17 179 lb 3.2 oz (81.3 kg)  09/27/16 175 lb 6.4 oz (79.6  kg)   BP Readings from Last 3 Encounters:  11/14/17 138/76  08/31/17 122/76  09/27/16 110/68     Immunization History  Administered Date(s) Administered  . Hepatitis B 04/10/2008, 05/13/2008, 12/05/2008  . Influenza Split 11/25/2011  . Influenza Whole 01/18/2007, 12/06/2007, 12/22/2008, 11/17/2009  . Influenza,inj,Quad PF,6+ Mos 12/13/2012, 12/09/2013  . Influenza-Unspecified 01/20/2015  . Pneumococcal Conjugate-13 05/08/2015  . Pneumococcal Polysaccharide-23 07/11/2006, 09/04/2012  . Tdap 11/25/2011  . Zoster 01/14/2008    Health Maintenance  Topic Date Due  . DEXA SCAN  01/06/2001  . INFLUENZA VACCINE  10/19/2017  . TETANUS/TDAP  11/24/2021  . PNA vac Low Risk Adult  Completed    Lab Results  Component Value Date   WBC 7.5 08/31/2017   HGB 13.3 08/31/2017   HCT 39.1 08/31/2017   PLT 280.0 08/31/2017   GLUCOSE 92 08/31/2017   CHOL 253 (H) 08/31/2017   TRIG 395.0 (H) 08/31/2017   HDL 54.90 08/31/2017   LDLDIRECT 135.0 08/31/2017   LDLCALC 108 (H) 02/18/2016   ALT 16 08/31/2017   AST 19 08/31/2017   NA 140 08/31/2017   K 4.1 08/31/2017   CL 103 08/31/2017   CREATININE 1.07 08/31/2017   BUN 26 (H) 08/31/2017   CO2 27 08/31/2017   TSH 2.74 08/31/2017   INR 1.15 12/21/2011   HGBA1C 6.5 08/31/2017    Lab Results  Component Value Date   TSH 2.74 08/31/2017   Lab Results  Component Value Date   WBC 7.5 08/31/2017   HGB 13.3 08/31/2017   HCT 39.1 08/31/2017   MCV 89.8 08/31/2017   PLT 280.0 08/31/2017   Lab Results  Component Value  Date   NA 140 08/31/2017   K 4.1 08/31/2017   CO2 27 08/31/2017   GLUCOSE 92 08/31/2017   BUN 26 (H) 08/31/2017   CREATININE 1.07 08/31/2017   BILITOT 0.3 08/31/2017   ALKPHOS 95 08/31/2017   AST 19 08/31/2017   ALT 16 08/31/2017   PROT 7.2 08/31/2017   ALBUMIN 4.1 08/31/2017   CALCIUM 9.8 08/31/2017   GFR 52.23 (L) 08/31/2017   Lab Results  Component Value Date   CHOL 253 (H) 08/31/2017   Lab Results  Component Value Date   HDL 54.90 08/31/2017   Lab Results  Component Value Date   LDLCALC 108 (H) 02/18/2016   Lab Results  Component Value Date   TRIG 395.0 (H) 08/31/2017   Lab Results  Component Value Date   CHOLHDL 5 08/31/2017   Lab Results  Component Value Date   HGBA1C 6.5 08/31/2017         Assessment & Plan:   Problem List Items Addressed This Visit    Hyperlipidemia    Encouraged heart healthy diet, increase exercise, avoid trans fats, consider a krill oil cap daily      Essential hypertension    Well controlled, no changes to meds. Encouraged heart healthy diet such as the DASH diet and exercise as tolerated.       CHEST PAIN, ATYPICAL - Primary   Relevant Orders   Ambulatory referral to Cardiology   HYPERGLYCEMIA    hgba1c acceptable, minimize simple carbs. Increase exercise as tolerated.       GERD (gastroesophageal reflux disease)    Avoid offending foods, start probiotics. Do not eat large meals in late evening and consider raising head of bed.       Memory loss    Memory loss shown on MMSE will start Donepezil and reassess at  next visit. Report any concerning side effects and asked to bring someone with her to her next visit that she trusts to discuss her oiptions      Dementia    MMSE is 18/30 today. She is started on Aricept 5 mg daily and reevaluate at next visit.          I have discontinued Jessicia Goodpasture's citalopram. I am also having her maintain her aspirin, clopidogrel, allopurinol, and pravastatin.  Meds ordered  this encounter  Medications  . DISCONTD: donepezil (ARICEPT) 5 MG tablet    Sig: 1 tab po x 4 weeks then increase to 2 tabs po daily    Dispense:  60 tablet    Refill:  0   CMA served as scribe during this visit. History, Physical and Plan performed by medical provider. Documentation and orders reviewed and attested to.    Danise Edge, MD

## 2017-11-14 NOTE — Assessment & Plan Note (Addendum)
Memory loss shown on MMSE will start Donepezil and reassess at next visit. Report any concerning side effects and asked to bring someone with her to her next visit that she trusts to discuss her oiptions

## 2017-11-20 DIAGNOSIS — F0390 Unspecified dementia without behavioral disturbance: Secondary | ICD-10-CM | POA: Insufficient documentation

## 2017-11-20 NOTE — Assessment & Plan Note (Signed)
Avoid offending foods, start probiotics. Do not eat large meals in late evening and consider raising head of bed.  

## 2017-11-20 NOTE — Assessment & Plan Note (Signed)
Encouraged heart healthy diet, increase exercise, avoid trans fats, consider a krill oil cap daily 

## 2017-11-20 NOTE — Assessment & Plan Note (Signed)
hgba1c acceptable, minimize simple carbs. Increase exercise as tolerated.  

## 2017-11-20 NOTE — Assessment & Plan Note (Signed)
MMSE is 18/30 today. She is started on Aricept 5 mg daily and reevaluate at next visit.

## 2017-11-20 NOTE — Assessment & Plan Note (Signed)
Well controlled, no changes to meds. Encouraged heart healthy diet such as the DASH diet and exercise as tolerated.  °

## 2017-12-08 DIAGNOSIS — I2510 Atherosclerotic heart disease of native coronary artery without angina pectoris: Secondary | ICD-10-CM | POA: Diagnosis not present

## 2017-12-08 DIAGNOSIS — R7309 Other abnormal glucose: Secondary | ICD-10-CM | POA: Diagnosis not present

## 2017-12-08 DIAGNOSIS — M109 Gout, unspecified: Secondary | ICD-10-CM | POA: Diagnosis not present

## 2017-12-08 DIAGNOSIS — I10 Essential (primary) hypertension: Secondary | ICD-10-CM | POA: Diagnosis not present

## 2017-12-08 DIAGNOSIS — E785 Hyperlipidemia, unspecified: Secondary | ICD-10-CM | POA: Diagnosis not present

## 2017-12-08 DIAGNOSIS — L209 Atopic dermatitis, unspecified: Secondary | ICD-10-CM | POA: Diagnosis not present

## 2017-12-13 DIAGNOSIS — J309 Allergic rhinitis, unspecified: Secondary | ICD-10-CM | POA: Diagnosis not present

## 2018-01-10 DIAGNOSIS — H5212 Myopia, left eye: Secondary | ICD-10-CM | POA: Diagnosis not present

## 2018-03-09 DIAGNOSIS — R7303 Prediabetes: Secondary | ICD-10-CM | POA: Diagnosis not present

## 2018-03-09 DIAGNOSIS — E785 Hyperlipidemia, unspecified: Secondary | ICD-10-CM | POA: Diagnosis not present

## 2018-03-09 DIAGNOSIS — I10 Essential (primary) hypertension: Secondary | ICD-10-CM | POA: Diagnosis not present

## 2018-03-09 DIAGNOSIS — I2510 Atherosclerotic heart disease of native coronary artery without angina pectoris: Secondary | ICD-10-CM | POA: Diagnosis not present

## 2018-03-09 DIAGNOSIS — L209 Atopic dermatitis, unspecified: Secondary | ICD-10-CM | POA: Diagnosis not present

## 2018-03-09 DIAGNOSIS — M109 Gout, unspecified: Secondary | ICD-10-CM | POA: Diagnosis not present

## 2018-03-27 DIAGNOSIS — E7800 Pure hypercholesterolemia, unspecified: Secondary | ICD-10-CM | POA: Diagnosis not present

## 2018-03-27 DIAGNOSIS — Z6831 Body mass index (BMI) 31.0-31.9, adult: Secondary | ICD-10-CM | POA: Diagnosis not present

## 2018-03-27 DIAGNOSIS — I10 Essential (primary) hypertension: Secondary | ICD-10-CM | POA: Diagnosis not present

## 2018-03-27 DIAGNOSIS — E669 Obesity, unspecified: Secondary | ICD-10-CM | POA: Diagnosis not present

## 2018-03-27 DIAGNOSIS — M1A9XX0 Chronic gout, unspecified, without tophus (tophi): Secondary | ICD-10-CM | POA: Diagnosis not present

## 2018-06-17 ENCOUNTER — Other Ambulatory Visit: Payer: Self-pay | Admitting: Family Medicine

## 2018-08-15 DIAGNOSIS — I10 Essential (primary) hypertension: Secondary | ICD-10-CM | POA: Diagnosis not present

## 2018-08-15 DIAGNOSIS — M1A9XX0 Chronic gout, unspecified, without tophus (tophi): Secondary | ICD-10-CM | POA: Diagnosis not present

## 2018-08-15 DIAGNOSIS — E1169 Type 2 diabetes mellitus with other specified complication: Secondary | ICD-10-CM | POA: Diagnosis not present

## 2018-08-15 DIAGNOSIS — E7800 Pure hypercholesterolemia, unspecified: Secondary | ICD-10-CM | POA: Diagnosis not present

## 2018-08-27 DIAGNOSIS — L209 Atopic dermatitis, unspecified: Secondary | ICD-10-CM | POA: Diagnosis not present

## 2018-08-27 DIAGNOSIS — E785 Hyperlipidemia, unspecified: Secondary | ICD-10-CM | POA: Diagnosis not present

## 2018-08-27 DIAGNOSIS — I10 Essential (primary) hypertension: Secondary | ICD-10-CM | POA: Diagnosis not present

## 2018-08-27 DIAGNOSIS — M109 Gout, unspecified: Secondary | ICD-10-CM | POA: Diagnosis not present

## 2018-08-27 DIAGNOSIS — R7303 Prediabetes: Secondary | ICD-10-CM | POA: Diagnosis not present

## 2018-08-27 DIAGNOSIS — I2510 Atherosclerotic heart disease of native coronary artery without angina pectoris: Secondary | ICD-10-CM | POA: Diagnosis not present

## 2018-08-29 DIAGNOSIS — R7303 Prediabetes: Secondary | ICD-10-CM | POA: Diagnosis not present

## 2018-08-29 DIAGNOSIS — I10 Essential (primary) hypertension: Secondary | ICD-10-CM | POA: Diagnosis not present

## 2018-08-29 DIAGNOSIS — M109 Gout, unspecified: Secondary | ICD-10-CM | POA: Diagnosis not present

## 2018-08-29 DIAGNOSIS — E785 Hyperlipidemia, unspecified: Secondary | ICD-10-CM | POA: Diagnosis not present

## 2018-09-07 ENCOUNTER — Telehealth: Payer: Self-pay

## 2018-09-07 NOTE — Telephone Encounter (Signed)
PA initiated via Covermymeds; KEY: [id number]. Awaiting determination.

## 2018-09-10 NOTE — Telephone Encounter (Signed)
PA approved.  PA Case: [id number], Status: Approved, Coverage Starts on: 03/21/2018 12:00:00 AM, Coverage Ends on: 03/21/2019 12:00:00 AM. Questions? Contact [phone]

## 2018-09-16 ENCOUNTER — Encounter (HOSPITAL_COMMUNITY): Payer: Self-pay | Admitting: Emergency Medicine

## 2018-09-16 ENCOUNTER — Emergency Department (HOSPITAL_COMMUNITY)
Admission: EM | Admit: 2018-09-16 | Discharge: 2018-09-16 | Disposition: A | Discharge status: Home / Self Care | Payer: Medicare HMO | Attending: Emergency Medicine | Admitting: Emergency Medicine

## 2018-09-16 ENCOUNTER — Other Ambulatory Visit: Payer: Self-pay

## 2018-09-16 DIAGNOSIS — R55 Syncope and collapse: Secondary | ICD-10-CM | POA: Insufficient documentation

## 2018-09-16 DIAGNOSIS — Z7982 Long term (current) use of aspirin: Secondary | ICD-10-CM | POA: Diagnosis not present

## 2018-09-16 DIAGNOSIS — F0390 Unspecified dementia without behavioral disturbance: Secondary | ICD-10-CM | POA: Diagnosis not present

## 2018-09-16 DIAGNOSIS — I10 Essential (primary) hypertension: Secondary | ICD-10-CM | POA: Diagnosis not present

## 2018-09-16 DIAGNOSIS — Z79899 Other long term (current) drug therapy: Secondary | ICD-10-CM | POA: Insufficient documentation

## 2018-09-16 LAB — CBG MONITORING, ED
Glucose-Capillary: 105 mg/dL — ABNORMAL HIGH (ref 70–99)
Glucose-Capillary: 112 mg/dL — ABNORMAL HIGH (ref 70–99)

## 2018-09-16 LAB — URINALYSIS, ROUTINE W REFLEX MICROSCOPIC
Bacteria, UA: NONE SEEN
Bilirubin Urine: NEGATIVE
Glucose, UA: NEGATIVE mg/dL
Ketones, ur: NEGATIVE mg/dL
Nitrite: NEGATIVE
Protein, ur: NEGATIVE mg/dL
Specific Gravity, Urine: 1.021 (ref 1.005–1.030)
pH: 5 (ref 5.0–8.0)

## 2018-09-16 LAB — CBC
HCT: 41.1 % (ref 36.0–46.0)
Hemoglobin: 13.8 g/dL (ref 12.0–15.0)
MCH: 29.7 pg (ref 26.0–34.0)
MCHC: 33.6 g/dL (ref 30.0–36.0)
MCV: 88.4 fL (ref 80.0–100.0)
Platelets: 270 10*3/uL (ref 150–400)
RBC: 4.65 MIL/uL (ref 3.87–5.11)
RDW: 12.6 % (ref 11.5–15.5)
WBC: 7.3 10*3/uL (ref 4.0–10.5)
nRBC: 0 % (ref 0.0–0.2)

## 2018-09-16 LAB — BASIC METABOLIC PANEL
Anion gap: 9 (ref 5–15)
BUN: 15 mg/dL (ref 8–23)
CO2: 24 mmol/L (ref 22–32)
Calcium: 9.6 mg/dL (ref 8.9–10.3)
Chloride: 108 mmol/L (ref 98–111)
Creatinine, Ser: 1.03 mg/dL — ABNORMAL HIGH (ref 0.44–1.00)
GFR calc Af Amer: 59 mL/min — ABNORMAL LOW (ref >60)
GFR calc non Af Amer: 51 mL/min — ABNORMAL LOW (ref >60)
Glucose, Bld: 117 mg/dL — ABNORMAL HIGH (ref 70–99)
Potassium: 3.9 mmol/L (ref 3.5–5.1)
Sodium: 141 mmol/L (ref 135–145)

## 2018-09-16 MED ORDER — SODIUM CHLORIDE 0.9% FLUSH: 3.0000 mL | Freq: Once | INTRAVENOUS | Status: DC

## 2018-09-16 NOTE — ED Triage Notes (Signed)
Patient reports feeling lightheaded upon bending over to pick something up less than an hour ago. She reports it improved shortly after it came on and she hasn't had it since. She is diaphoretic upon arrival, but states it was hot outside and she feels okay now. She denies chest pain, shortness of breath, dizziness, N/V. Resp e/u.

## 2018-09-16 NOTE — ED Provider Notes (Signed)
[facility] EMERGENCY DEPARTMENT Provider Note   CSN: [id number] Arrival date & time: 09/16/18  1806    History   Chief Complaint Chief Complaint  Patient presents with  . Near Syncope    HPI Carolyn Adams is a 85 y.o. female with past medical history of hypertension, GERD, presenting to the emergency department with complaint of near syncopal episode.  Patient states she was standing out in the yard with her dogs for a while and thinks that she got overheated.  She states she began feeling sweaty and overheated.  She states she bent over her second and felt lightheaded, however did not have any syncopal episode.  When she stood up she felt better.  No palpitations, chest pain, shortness of breath, headache, vision changes or any other complaints.  States she is ready for discharge.     The history is provided by the patient.    Past Medical History:  Diagnosis Date  . Allergy   . Anginal pain ([place]) 12/21/2011  . Chronic headaches    "but not all the time" (12/21/2011)  . Chronic sinus infection    "terrible" (12/21/2011)  . Constipation 05/17/2015  . H/O measles   . H/O mumps   . Headache 12/14/2014  . History of chicken pox   . Hyperlipidemia   . Hypertension   . Memory loss 02/28/2016  . Osteoarthritis    "hands" (12/21/2011)  . Vertigo     Patient Active Problem List   Diagnosis Date Noted  . Dementia ([place]) 11/20/2017  . Gout 09/27/2016  . Obesity 09/27/2016  . Memory loss 02/28/2016  . Postmenopausal estrogen deficiency 11/23/2015  . Decreased visual acuity 11/23/2015  . Constipation 05/17/2015  . Headache 12/14/2014  . History of chicken pox   . Dermatitis 08/30/2013  . Preventative health care 09/07/2011  . GERD (gastroesophageal reflux disease) 11/12/2010  . HYPERGLYCEMIA 04/01/2010  . CHEST PAIN, ATYPICAL 02/22/2010  . INTERMITTENT VERTIGO 06/03/2008  . HOT FLASHES 04/10/2008  . Osteoarthritis 03/13/2007  . Hyperlipidemia  10/07/2006  . Essential hypertension 10/07/2006    Past Surgical History:  Procedure Laterality Date  . ABDOMINAL HYSTERECTOMY  1970's   partial,   . CARDIAC CATHETERIZATION N/A 06/02/2015   Procedure: Left Heart Cath and Coronary Angiography;  Surgeon: Charolette Forward, MD;  Location: [facility];  Service: Cardiovascular;  Laterality: N/A;  . LEFT HEART CATHETERIZATION WITH CORONARY ANGIOGRAM N/A 12/22/2011   Procedure: LEFT HEART CATHETERIZATION WITH CORONARY ANGIOGRAM;  Surgeon: Clent Demark, MD;  Location: [facility];  Service: Cardiovascular;  Laterality: N/A;  . NASAL SEPTUM SURGERY  01/24/13   [facility]     OB History   No obstetric history on file.      Home Medications    Prior to Admission medications   Medication Sig Start Date End Date Taking? Authorizing Provider  allopurinol (ZYLOPRIM) 100 MG tablet TAKE 1 TABLET EVERY DAY 04/18/17   Mosie Lukes, MD  aspirin 81 MG EC tablet Take 81 mg by mouth daily.      [provider]  citalopram (CELEXA) 10 MG tablet TAKE 1 TABLET EVERY DAY 06/18/18   Mosie Lukes, MD  clopidogrel (PLAVIX) 75 MG tablet Take 1 tablet by mouth daily. 05/02/15   [provider]  donepezil (ARICEPT) 5 MG tablet TAKE 1 TABLET BY MOUTH DAILY FOR 28 DAYS THEN INCREASE TO 2 TABS DAILY 11/17/17   Mosie Lukes, MD  pravastatin (PRAVACHOL) 40 MG  tablet TAKE 1 TABLET EVERY DAY 06/18/18   Bradd CanaryBlyth, Stacey A, MD    Family History Family History  Problem Relation Age of Onset  . Cancer Sister   . Coronary artery disease Brother        s/p 3 stents  . Heart disease Brother        s/p 3 stents  . Arthritis Brother   . Heart attack Son   . Heart disease Son   . Cancer Mother        breast  . Hypertension Other   . Cancer Other        lung  . Diabetes Neg Hx   . Colon cancer Neg Hx     Social History Social History   Tobacco Use  . Smoking status: Never Smoker  . Smokeless tobacco: Never Used  Substance Use  Topics  . Alcohol use: No  . Drug use: No     Allergies   Methylprednisolone, Ampicillin, Hydrocodone-acetaminophen, and Olmesartan medoxomil   Review of Systems Review of Systems  All other systems reviewed and are negative.    Physical Exam Updated Vital Signs BP (!) 120/103   Pulse 73   Temp 98.4 F (36.9 C) (Oral)   Resp 16   Ht 5\' 3"  (1.6 m)   Wt 70.8 kg   SpO2 96%   BMI 27.63 kg/m   Physical Exam Vitals signs and nursing note reviewed.  Constitutional:      General: She is not in acute distress.    Appearance: She is well-developed.  HENT:     Head: Normocephalic and atraumatic.  Eyes:     Extraocular Movements: Extraocular movements intact.     Conjunctiva/sclera: Conjunctivae normal.     Pupils: Pupils are equal, round, and reactive to light.  Cardiovascular:     Rate and Rhythm: Normal rate and regular rhythm.     Heart sounds: Normal heart sounds.  Pulmonary:     Effort: Pulmonary effort is normal. No respiratory distress.     Breath sounds: Normal breath sounds.  Abdominal:     Palpations: Abdomen is soft.  Skin:    General: Skin is warm.  Neurological:     Mental Status: She is alert and oriented to person, place, and time.  Psychiatric:        Behavior: Behavior normal.      ED Treatments / Results  Labs (all labs ordered are listed, but only abnormal results are displayed) Labs Reviewed  BASIC METABOLIC PANEL - Abnormal; Notable for the following components:      Result Value   Glucose, Bld 117 (*)    Creatinine, Ser 1.03 (*)    GFR calc non Af Amer 51 (*)    GFR calc Af Amer 59 (*)    All other components within normal limits  URINALYSIS, ROUTINE W REFLEX MICROSCOPIC - Abnormal; Notable for the following components:   APPearance HAZY (*)    Hgb urine dipstick SMALL (*)    Leukocytes,Ua LARGE (*)    All other components within normal limits  CBG MONITORING, ED - Abnormal; Notable for the following components:   Glucose-Capillary  105 (*)    All other components within normal limits  CBG MONITORING, ED - Abnormal; Notable for the following components:   Glucose-Capillary 112 (*)    All other components within normal limits  URINE CULTURE  CBC    EKG EKG Interpretation  Date/Time:  Sunday September 16 2018 18:12:18 EDT Ventricular Rate:  75 PR Interval:  178 QRS Duration: 82 QT Interval:  386 QTC Calculation: 431 R Axis:   72 Text Interpretation:  Normal sinus rhythm Normal ECG Confirmed by Virgina NorfolkAdam, [place] [id number]) on 09/16/2018 8:17:39 PM   Radiology No results found.  Procedures Procedures (including critical care time)  Medications Ordered in ED Medications  sodium chloride flush (NS) 0.9 % injection 3 mL (has no administration in time range)     Initial Impression / Assessment and Plan / ED Course  I have reviewed the triage vital signs and the nursing notes.  Pertinent labs & imaging results that were available during my care of the patient were reviewed by me and considered in my medical decision making (see chart for details).       Patient presenting with near syncopal episode after standing out in the heat.  She states she bent over for something and felt a little bit lightheaded, however did not pass out.  No associated symptoms.  Exam today is reassuring.  No lab abnormalities.  Urine appears to be contaminated specimen.  Urine culture sent.  EKG without arrhythmia.  At this time, will discharge with instructions to hydrate follow-up with PCP.  Discussed results, findings, treatment and follow up. Patient advised of return precautions. Patient verbalized understanding and agreed with plan.   Final Clinical Impressions(s) / ED Diagnoses   Final diagnoses:  Near syncope    ED Discharge Orders    None       Jaci Desanto, [place] N, Cordelia Poche-C 09/16/18 2046    Virgina NorfolkCuratolo, Adam, DO 09/16/18 2055

## 2018-09-16 NOTE — Discharge Instructions (Signed)
Stay hydrated. Follow up with your primary care provider regarding your visit today. Return to the ER for recurrent symptoms, or new or worsening symptoms.

## 2018-09-18 LAB — URINE CULTURE

## 2018-10-03 ENCOUNTER — Other Ambulatory Visit: Payer: Self-pay | Admitting: *Deleted

## 2018-10-03 DIAGNOSIS — Z20822 Contact with and (suspected) exposure to covid-19: Secondary | ICD-10-CM

## 2018-10-03 NOTE — Progress Notes (Signed)
l °

## 2018-10-08 LAB — NOVEL CORONAVIRUS, NAA

## 2018-10-12 ENCOUNTER — Telehealth: Payer: Self-pay

## 2018-10-12 NOTE — Telephone Encounter (Signed)
Contacted pt due to [facility] not running her test due to  CANCELED   Comment: Quantity was not sufficient for analysis.    Pt was very concerned about this and states she was sitting in the car for hours awaiting to be tested and now her test was not done and she really does not want to sit and wait again. Gave pt the information about the testing site at [facility] since she went to a mobile site. Pt took the information. She had no additional questions at this time. Nothing further is needed

## 2018-10-15 ENCOUNTER — Other Ambulatory Visit: Payer: Self-pay

## 2018-10-15 DIAGNOSIS — R6889 Other general symptoms and signs: Secondary | ICD-10-CM | POA: Diagnosis not present

## 2018-10-15 DIAGNOSIS — Z20822 Contact with and (suspected) exposure to covid-19: Secondary | ICD-10-CM

## 2018-10-17 LAB — NOVEL CORONAVIRUS, NAA: SARS-CoV-2, NAA: NOT DETECTED

## 2018-10-29 ENCOUNTER — Other Ambulatory Visit: Payer: Self-pay

## 2018-10-29 DIAGNOSIS — Z20822 Contact with and (suspected) exposure to covid-19: Secondary | ICD-10-CM

## 2018-10-30 LAB — NOVEL CORONAVIRUS, NAA: SARS-CoV-2, NAA: NOT DETECTED

## 2018-10-31 ENCOUNTER — Other Ambulatory Visit: Payer: Self-pay | Admitting: Family Medicine

## 2018-11-27 DIAGNOSIS — E785 Hyperlipidemia, unspecified: Secondary | ICD-10-CM | POA: Diagnosis not present

## 2018-11-27 DIAGNOSIS — M109 Gout, unspecified: Secondary | ICD-10-CM | POA: Diagnosis not present

## 2018-11-27 DIAGNOSIS — R7303 Prediabetes: Secondary | ICD-10-CM | POA: Diagnosis not present

## 2018-11-27 DIAGNOSIS — I10 Essential (primary) hypertension: Secondary | ICD-10-CM | POA: Diagnosis not present

## 2018-11-27 DIAGNOSIS — I2510 Atherosclerotic heart disease of native coronary artery without angina pectoris: Secondary | ICD-10-CM | POA: Diagnosis not present

## 2018-12-24 DIAGNOSIS — E7800 Pure hypercholesterolemia, unspecified: Secondary | ICD-10-CM | POA: Diagnosis not present

## 2018-12-24 DIAGNOSIS — I2510 Atherosclerotic heart disease of native coronary artery without angina pectoris: Secondary | ICD-10-CM | POA: Diagnosis not present

## 2018-12-24 DIAGNOSIS — E1169 Type 2 diabetes mellitus with other specified complication: Secondary | ICD-10-CM | POA: Diagnosis not present

## 2018-12-24 DIAGNOSIS — M1A9XX0 Chronic gout, unspecified, without tophus (tophi): Secondary | ICD-10-CM | POA: Diagnosis not present

## 2018-12-25 ENCOUNTER — Other Ambulatory Visit: Payer: Self-pay

## 2018-12-25 DIAGNOSIS — Z20828 Contact with and (suspected) exposure to other viral communicable diseases: Secondary | ICD-10-CM | POA: Diagnosis not present

## 2018-12-25 DIAGNOSIS — Z20822 Contact with and (suspected) exposure to covid-19: Secondary | ICD-10-CM

## 2018-12-27 LAB — NOVEL CORONAVIRUS, NAA: SARS-CoV-2, NAA: NOT DETECTED

## 2019-01-11 ENCOUNTER — Other Ambulatory Visit: Payer: Self-pay | Admitting: Family Medicine

## 2019-02-26 DIAGNOSIS — E785 Hyperlipidemia, unspecified: Secondary | ICD-10-CM | POA: Diagnosis not present

## 2019-02-26 DIAGNOSIS — I10 Essential (primary) hypertension: Secondary | ICD-10-CM | POA: Diagnosis not present

## 2019-02-26 DIAGNOSIS — M109 Gout, unspecified: Secondary | ICD-10-CM | POA: Diagnosis not present

## 2019-02-26 DIAGNOSIS — R7303 Prediabetes: Secondary | ICD-10-CM | POA: Diagnosis not present

## 2019-02-26 DIAGNOSIS — I2510 Atherosclerotic heart disease of native coronary artery without angina pectoris: Secondary | ICD-10-CM | POA: Diagnosis not present

## 2019-03-14 ENCOUNTER — Ambulatory Visit: Payer: Medicare HMO | Attending: Internal Medicine

## 2019-03-14 DIAGNOSIS — Z20822 Contact with and (suspected) exposure to covid-19: Secondary | ICD-10-CM

## 2019-03-14 DIAGNOSIS — Z20828 Contact with and (suspected) exposure to other viral communicable diseases: Secondary | ICD-10-CM | POA: Diagnosis not present

## 2019-03-15 LAB — NOVEL CORONAVIRUS, NAA: SARS-CoV-2, NAA: NOT DETECTED

## 2019-03-17 ENCOUNTER — Telehealth: Payer: Self-pay | Admitting: Family Medicine

## 2019-03-17 NOTE — Telephone Encounter (Signed)
Patient is calling for the results of her COVID test. Patient expressed understanding.

## 2019-03-22 ENCOUNTER — Other Ambulatory Visit: Payer: Self-pay | Admitting: Family Medicine

## 2019-04-05 ENCOUNTER — Ambulatory Visit: Payer: Medicare HMO | Attending: Internal Medicine

## 2019-04-05 DIAGNOSIS — Z20822 Contact with and (suspected) exposure to covid-19: Secondary | ICD-10-CM | POA: Diagnosis not present

## 2019-04-06 LAB — NOVEL CORONAVIRUS, NAA: SARS-CoV-2, NAA: NOT DETECTED

## 2019-04-07 ENCOUNTER — Ambulatory Visit: Payer: Medicare HMO | Attending: Internal Medicine

## 2019-04-07 DIAGNOSIS — Z23 Encounter for immunization: Secondary | ICD-10-CM | POA: Insufficient documentation

## 2019-04-07 NOTE — Progress Notes (Signed)
   Covid-19 Vaccination Clinic  Name:  Carolyn Adams    MRN: [id number] DOB: 10-05-1933  04/07/2019  [name] was observed post Covid-19 immunization for 15 minutes without incidence. She was provided with Vaccine Information Sheet and instruction to access the V-Safe system.   [name] was instructed to call 911 with any severe reactions post vaccine: Marland Kitchen Difficulty breathing  . Swelling of your face and throat  . A fast heartbeat  . A bad rash all over your body  . Dizziness and weakness

## 2019-04-08 DIAGNOSIS — I10 Essential (primary) hypertension: Secondary | ICD-10-CM | POA: Diagnosis not present

## 2019-04-08 DIAGNOSIS — E7800 Pure hypercholesterolemia, unspecified: Secondary | ICD-10-CM | POA: Diagnosis not present

## 2019-04-08 DIAGNOSIS — I2510 Atherosclerotic heart disease of native coronary artery without angina pectoris: Secondary | ICD-10-CM | POA: Diagnosis not present

## 2019-04-08 DIAGNOSIS — E1169 Type 2 diabetes mellitus with other specified complication: Secondary | ICD-10-CM | POA: Diagnosis not present

## 2019-04-14 ENCOUNTER — Ambulatory Visit: Payer: Medicare HMO

## 2019-04-28 ENCOUNTER — Ambulatory Visit: Payer: Medicare HMO | Attending: Internal Medicine

## 2019-04-28 DIAGNOSIS — Z23 Encounter for immunization: Secondary | ICD-10-CM | POA: Insufficient documentation

## 2019-05-06 DIAGNOSIS — I10 Essential (primary) hypertension: Secondary | ICD-10-CM | POA: Diagnosis not present

## 2019-05-06 DIAGNOSIS — E1169 Type 2 diabetes mellitus with other specified complication: Secondary | ICD-10-CM | POA: Diagnosis not present

## 2019-05-06 DIAGNOSIS — I2510 Atherosclerotic heart disease of native coronary artery without angina pectoris: Secondary | ICD-10-CM | POA: Diagnosis not present

## 2019-05-06 DIAGNOSIS — E7800 Pure hypercholesterolemia, unspecified: Secondary | ICD-10-CM | POA: Diagnosis not present

## 2019-05-27 DIAGNOSIS — I2510 Atherosclerotic heart disease of native coronary artery without angina pectoris: Secondary | ICD-10-CM | POA: Diagnosis not present

## 2019-05-27 DIAGNOSIS — I10 Essential (primary) hypertension: Secondary | ICD-10-CM | POA: Diagnosis not present

## 2019-05-27 DIAGNOSIS — M109 Gout, unspecified: Secondary | ICD-10-CM | POA: Diagnosis not present

## 2019-05-27 DIAGNOSIS — E785 Hyperlipidemia, unspecified: Secondary | ICD-10-CM | POA: Diagnosis not present

## 2019-05-27 DIAGNOSIS — R7303 Prediabetes: Secondary | ICD-10-CM | POA: Diagnosis not present

## 2019-06-14 DIAGNOSIS — E1169 Type 2 diabetes mellitus with other specified complication: Secondary | ICD-10-CM | POA: Diagnosis not present

## 2019-06-14 DIAGNOSIS — I10 Essential (primary) hypertension: Secondary | ICD-10-CM | POA: Diagnosis not present

## 2019-06-14 DIAGNOSIS — I2510 Atherosclerotic heart disease of native coronary artery without angina pectoris: Secondary | ICD-10-CM | POA: Diagnosis not present

## 2019-06-14 DIAGNOSIS — E7800 Pure hypercholesterolemia, unspecified: Secondary | ICD-10-CM | POA: Diagnosis not present

## 2019-07-19 DIAGNOSIS — E1169 Type 2 diabetes mellitus with other specified complication: Secondary | ICD-10-CM | POA: Diagnosis not present

## 2019-07-19 DIAGNOSIS — E7800 Pure hypercholesterolemia, unspecified: Secondary | ICD-10-CM | POA: Diagnosis not present

## 2019-07-19 DIAGNOSIS — I10 Essential (primary) hypertension: Secondary | ICD-10-CM | POA: Diagnosis not present

## 2019-07-19 DIAGNOSIS — I2510 Atherosclerotic heart disease of native coronary artery without angina pectoris: Secondary | ICD-10-CM | POA: Diagnosis not present

## 2019-07-23 DIAGNOSIS — I10 Essential (primary) hypertension: Secondary | ICD-10-CM | POA: Diagnosis not present

## 2019-08-15 DIAGNOSIS — I2510 Atherosclerotic heart disease of native coronary artery without angina pectoris: Secondary | ICD-10-CM | POA: Diagnosis not present

## 2019-08-15 DIAGNOSIS — E1169 Type 2 diabetes mellitus with other specified complication: Secondary | ICD-10-CM | POA: Diagnosis not present

## 2019-08-15 DIAGNOSIS — I10 Essential (primary) hypertension: Secondary | ICD-10-CM | POA: Diagnosis not present

## 2019-08-15 DIAGNOSIS — E7800 Pure hypercholesterolemia, unspecified: Secondary | ICD-10-CM | POA: Diagnosis not present

## 2019-08-27 DIAGNOSIS — F419 Anxiety disorder, unspecified: Secondary | ICD-10-CM | POA: Diagnosis not present

## 2019-08-27 DIAGNOSIS — I2510 Atherosclerotic heart disease of native coronary artery without angina pectoris: Secondary | ICD-10-CM | POA: Diagnosis not present

## 2019-08-27 DIAGNOSIS — E785 Hyperlipidemia, unspecified: Secondary | ICD-10-CM | POA: Diagnosis not present

## 2019-08-27 DIAGNOSIS — R7303 Prediabetes: Secondary | ICD-10-CM | POA: Diagnosis not present

## 2019-08-27 DIAGNOSIS — M109 Gout, unspecified: Secondary | ICD-10-CM | POA: Diagnosis not present

## 2019-08-27 DIAGNOSIS — I10 Essential (primary) hypertension: Secondary | ICD-10-CM | POA: Diagnosis not present

## 2019-08-29 DIAGNOSIS — I10 Essential (primary) hypertension: Secondary | ICD-10-CM | POA: Diagnosis not present

## 2019-08-29 DIAGNOSIS — E1169 Type 2 diabetes mellitus with other specified complication: Secondary | ICD-10-CM | POA: Diagnosis not present

## 2019-08-29 DIAGNOSIS — I2510 Atherosclerotic heart disease of native coronary artery without angina pectoris: Secondary | ICD-10-CM | POA: Diagnosis not present

## 2019-08-29 DIAGNOSIS — E7800 Pure hypercholesterolemia, unspecified: Secondary | ICD-10-CM | POA: Diagnosis not present

## 2019-11-28 DIAGNOSIS — I2510 Atherosclerotic heart disease of native coronary artery without angina pectoris: Secondary | ICD-10-CM | POA: Diagnosis not present

## 2019-11-28 DIAGNOSIS — I10 Essential (primary) hypertension: Secondary | ICD-10-CM | POA: Diagnosis not present

## 2019-11-28 DIAGNOSIS — E785 Hyperlipidemia, unspecified: Secondary | ICD-10-CM | POA: Diagnosis not present

## 2019-11-28 DIAGNOSIS — R7303 Prediabetes: Secondary | ICD-10-CM | POA: Diagnosis not present

## 2020-01-14 DIAGNOSIS — I2510 Atherosclerotic heart disease of native coronary artery without angina pectoris: Secondary | ICD-10-CM | POA: Diagnosis not present

## 2020-01-14 DIAGNOSIS — I10 Essential (primary) hypertension: Secondary | ICD-10-CM | POA: Diagnosis not present

## 2020-01-14 DIAGNOSIS — E1169 Type 2 diabetes mellitus with other specified complication: Secondary | ICD-10-CM | POA: Diagnosis not present

## 2020-01-14 DIAGNOSIS — E7800 Pure hypercholesterolemia, unspecified: Secondary | ICD-10-CM | POA: Diagnosis not present

## 2020-01-23 DIAGNOSIS — E1169 Type 2 diabetes mellitus with other specified complication: Secondary | ICD-10-CM | POA: Diagnosis not present

## 2020-01-23 DIAGNOSIS — I10 Essential (primary) hypertension: Secondary | ICD-10-CM | POA: Diagnosis not present

## 2020-01-23 DIAGNOSIS — E7800 Pure hypercholesterolemia, unspecified: Secondary | ICD-10-CM | POA: Diagnosis not present

## 2020-01-23 DIAGNOSIS — I2510 Atherosclerotic heart disease of native coronary artery without angina pectoris: Secondary | ICD-10-CM | POA: Diagnosis not present

## 2020-02-27 DIAGNOSIS — E785 Hyperlipidemia, unspecified: Secondary | ICD-10-CM | POA: Diagnosis not present

## 2020-02-27 DIAGNOSIS — I2510 Atherosclerotic heart disease of native coronary artery without angina pectoris: Secondary | ICD-10-CM | POA: Diagnosis not present

## 2020-02-27 DIAGNOSIS — F419 Anxiety disorder, unspecified: Secondary | ICD-10-CM | POA: Diagnosis not present

## 2020-02-27 DIAGNOSIS — I10 Essential (primary) hypertension: Secondary | ICD-10-CM | POA: Diagnosis not present

## 2020-02-27 DIAGNOSIS — R7303 Prediabetes: Secondary | ICD-10-CM | POA: Diagnosis not present

## 2020-05-11 DIAGNOSIS — I10 Essential (primary) hypertension: Secondary | ICD-10-CM | POA: Diagnosis not present

## 2020-05-11 DIAGNOSIS — E7800 Pure hypercholesterolemia, unspecified: Secondary | ICD-10-CM | POA: Diagnosis not present

## 2020-05-11 DIAGNOSIS — E1169 Type 2 diabetes mellitus with other specified complication: Secondary | ICD-10-CM | POA: Diagnosis not present

## 2020-05-11 DIAGNOSIS — I2510 Atherosclerotic heart disease of native coronary artery without angina pectoris: Secondary | ICD-10-CM | POA: Diagnosis not present

## 2020-05-20 DIAGNOSIS — I10 Essential (primary) hypertension: Secondary | ICD-10-CM | POA: Diagnosis not present

## 2020-05-20 DIAGNOSIS — E1169 Type 2 diabetes mellitus with other specified complication: Secondary | ICD-10-CM | POA: Diagnosis not present

## 2020-05-20 DIAGNOSIS — R21 Rash and other nonspecific skin eruption: Secondary | ICD-10-CM | POA: Diagnosis not present

## 2020-05-20 DIAGNOSIS — Z7984 Long term (current) use of oral hypoglycemic drugs: Secondary | ICD-10-CM | POA: Diagnosis not present

## 2020-05-20 DIAGNOSIS — M1A9XX0 Chronic gout, unspecified, without tophus (tophi): Secondary | ICD-10-CM | POA: Diagnosis not present

## 2020-06-03 DIAGNOSIS — E785 Hyperlipidemia, unspecified: Secondary | ICD-10-CM | POA: Diagnosis not present

## 2020-06-03 DIAGNOSIS — R7303 Prediabetes: Secondary | ICD-10-CM | POA: Diagnosis not present

## 2020-06-03 DIAGNOSIS — M109 Gout, unspecified: Secondary | ICD-10-CM | POA: Diagnosis not present

## 2020-06-03 DIAGNOSIS — I10 Essential (primary) hypertension: Secondary | ICD-10-CM | POA: Diagnosis not present

## 2020-06-03 DIAGNOSIS — I2510 Atherosclerotic heart disease of native coronary artery without angina pectoris: Secondary | ICD-10-CM | POA: Diagnosis not present

## 2020-09-16 DIAGNOSIS — F419 Anxiety disorder, unspecified: Secondary | ICD-10-CM | POA: Diagnosis not present

## 2020-09-16 DIAGNOSIS — R7303 Prediabetes: Secondary | ICD-10-CM | POA: Diagnosis not present

## 2020-09-16 DIAGNOSIS — I2510 Atherosclerotic heart disease of native coronary artery without angina pectoris: Secondary | ICD-10-CM | POA: Diagnosis not present

## 2020-09-16 DIAGNOSIS — I10 Essential (primary) hypertension: Secondary | ICD-10-CM | POA: Diagnosis not present

## 2020-09-16 DIAGNOSIS — M109 Gout, unspecified: Secondary | ICD-10-CM | POA: Diagnosis not present

## 2020-09-16 DIAGNOSIS — E785 Hyperlipidemia, unspecified: Secondary | ICD-10-CM | POA: Diagnosis not present

## 2020-12-30 DIAGNOSIS — I10 Essential (primary) hypertension: Secondary | ICD-10-CM | POA: Diagnosis not present

## 2020-12-30 DIAGNOSIS — E785 Hyperlipidemia, unspecified: Secondary | ICD-10-CM | POA: Diagnosis not present

## 2020-12-30 DIAGNOSIS — I2510 Atherosclerotic heart disease of native coronary artery without angina pectoris: Secondary | ICD-10-CM | POA: Diagnosis not present

## 2021-01-06 DIAGNOSIS — M109 Gout, unspecified: Secondary | ICD-10-CM | POA: Diagnosis not present

## 2021-01-06 DIAGNOSIS — R7303 Prediabetes: Secondary | ICD-10-CM | POA: Diagnosis not present

## 2021-01-06 DIAGNOSIS — E785 Hyperlipidemia, unspecified: Secondary | ICD-10-CM | POA: Diagnosis not present

## 2021-01-06 DIAGNOSIS — I10 Essential (primary) hypertension: Secondary | ICD-10-CM | POA: Diagnosis not present

## 2021-02-03 DIAGNOSIS — N183 Chronic kidney disease, stage 3 unspecified: Secondary | ICD-10-CM | POA: Diagnosis not present

## 2021-02-03 DIAGNOSIS — K5901 Slow transit constipation: Secondary | ICD-10-CM | POA: Diagnosis not present

## 2021-02-03 DIAGNOSIS — E1169 Type 2 diabetes mellitus with other specified complication: Secondary | ICD-10-CM | POA: Diagnosis not present

## 2021-04-15 DIAGNOSIS — R7303 Prediabetes: Secondary | ICD-10-CM | POA: Diagnosis not present

## 2021-04-15 DIAGNOSIS — I10 Essential (primary) hypertension: Secondary | ICD-10-CM | POA: Diagnosis not present

## 2021-04-15 DIAGNOSIS — E785 Hyperlipidemia, unspecified: Secondary | ICD-10-CM | POA: Diagnosis not present

## 2021-04-15 DIAGNOSIS — I2510 Atherosclerotic heart disease of native coronary artery without angina pectoris: Secondary | ICD-10-CM | POA: Diagnosis not present

## 2021-07-26 DIAGNOSIS — E785 Hyperlipidemia, unspecified: Secondary | ICD-10-CM | POA: Diagnosis not present

## 2021-07-26 DIAGNOSIS — R7303 Prediabetes: Secondary | ICD-10-CM | POA: Diagnosis not present

## 2021-07-26 DIAGNOSIS — I10 Essential (primary) hypertension: Secondary | ICD-10-CM | POA: Diagnosis not present

## 2021-07-26 DIAGNOSIS — I2510 Atherosclerotic heart disease of native coronary artery without angina pectoris: Secondary | ICD-10-CM | POA: Diagnosis not present

## 2021-12-21 DIAGNOSIS — I10 Essential (primary) hypertension: Secondary | ICD-10-CM | POA: Diagnosis not present

## 2021-12-21 DIAGNOSIS — I2510 Atherosclerotic heart disease of native coronary artery without angina pectoris: Secondary | ICD-10-CM | POA: Diagnosis not present

## 2021-12-21 DIAGNOSIS — R7303 Prediabetes: Secondary | ICD-10-CM | POA: Diagnosis not present

## 2021-12-21 DIAGNOSIS — E785 Hyperlipidemia, unspecified: Secondary | ICD-10-CM | POA: Diagnosis not present

## 2022-03-23 DIAGNOSIS — I10 Essential (primary) hypertension: Secondary | ICD-10-CM | POA: Diagnosis not present

## 2022-03-23 DIAGNOSIS — I2510 Atherosclerotic heart disease of native coronary artery without angina pectoris: Secondary | ICD-10-CM | POA: Diagnosis not present

## 2022-03-23 DIAGNOSIS — E785 Hyperlipidemia, unspecified: Secondary | ICD-10-CM | POA: Diagnosis not present

## 2022-03-23 DIAGNOSIS — R7303 Prediabetes: Secondary | ICD-10-CM | POA: Diagnosis not present

## 2022-06-23 DIAGNOSIS — I2510 Atherosclerotic heart disease of native coronary artery without angina pectoris: Secondary | ICD-10-CM | POA: Diagnosis not present

## 2022-06-23 DIAGNOSIS — E782 Mixed hyperlipidemia: Secondary | ICD-10-CM | POA: Diagnosis not present

## 2022-06-23 DIAGNOSIS — R7303 Prediabetes: Secondary | ICD-10-CM | POA: Diagnosis not present

## 2022-06-23 DIAGNOSIS — I10 Essential (primary) hypertension: Secondary | ICD-10-CM | POA: Diagnosis not present

## 2022-07-26 DIAGNOSIS — E782 Mixed hyperlipidemia: Secondary | ICD-10-CM | POA: Diagnosis not present

## 2022-07-26 DIAGNOSIS — I10 Essential (primary) hypertension: Secondary | ICD-10-CM | POA: Diagnosis not present

## 2022-07-26 DIAGNOSIS — I2510 Atherosclerotic heart disease of native coronary artery without angina pectoris: Secondary | ICD-10-CM | POA: Diagnosis not present

## 2022-07-26 DIAGNOSIS — R7303 Prediabetes: Secondary | ICD-10-CM | POA: Diagnosis not present

## 2022-10-24 DIAGNOSIS — I2510 Atherosclerotic heart disease of native coronary artery without angina pectoris: Secondary | ICD-10-CM | POA: Diagnosis not present

## 2022-10-24 DIAGNOSIS — R7303 Prediabetes: Secondary | ICD-10-CM | POA: Diagnosis not present

## 2022-10-24 DIAGNOSIS — E782 Mixed hyperlipidemia: Secondary | ICD-10-CM | POA: Diagnosis not present

## 2022-10-24 DIAGNOSIS — I10 Essential (primary) hypertension: Secondary | ICD-10-CM | POA: Diagnosis not present

## 2022-12-13 DIAGNOSIS — R7303 Prediabetes: Secondary | ICD-10-CM | POA: Diagnosis not present

## 2022-12-13 DIAGNOSIS — E782 Mixed hyperlipidemia: Secondary | ICD-10-CM | POA: Diagnosis not present

## 2022-12-13 DIAGNOSIS — I10 Essential (primary) hypertension: Secondary | ICD-10-CM | POA: Diagnosis not present

## 2022-12-13 DIAGNOSIS — I2510 Atherosclerotic heart disease of native coronary artery without angina pectoris: Secondary | ICD-10-CM | POA: Diagnosis not present

## 2022-12-16 DIAGNOSIS — I2510 Atherosclerotic heart disease of native coronary artery without angina pectoris: Secondary | ICD-10-CM | POA: Diagnosis not present

## 2022-12-16 DIAGNOSIS — E782 Mixed hyperlipidemia: Secondary | ICD-10-CM | POA: Diagnosis not present

## 2022-12-16 DIAGNOSIS — M109 Gout, unspecified: Secondary | ICD-10-CM | POA: Diagnosis not present

## 2022-12-16 DIAGNOSIS — R7302 Impaired glucose tolerance (oral): Secondary | ICD-10-CM | POA: Diagnosis not present

## 2023-03-07 DIAGNOSIS — R7303 Prediabetes: Secondary | ICD-10-CM | POA: Diagnosis not present

## 2023-03-07 DIAGNOSIS — M109 Gout, unspecified: Secondary | ICD-10-CM | POA: Diagnosis not present

## 2023-03-07 DIAGNOSIS — I10 Essential (primary) hypertension: Secondary | ICD-10-CM | POA: Diagnosis not present

## 2023-03-07 DIAGNOSIS — I2510 Atherosclerotic heart disease of native coronary artery without angina pectoris: Secondary | ICD-10-CM | POA: Diagnosis not present

## 2023-03-29 DIAGNOSIS — I2510 Atherosclerotic heart disease of native coronary artery without angina pectoris: Secondary | ICD-10-CM | POA: Diagnosis not present

## 2023-03-29 DIAGNOSIS — I10 Essential (primary) hypertension: Secondary | ICD-10-CM | POA: Diagnosis not present

## 2023-03-29 DIAGNOSIS — R7303 Prediabetes: Secondary | ICD-10-CM | POA: Diagnosis not present

## 2023-03-29 DIAGNOSIS — E782 Mixed hyperlipidemia: Secondary | ICD-10-CM | POA: Diagnosis not present

## 2023-05-17 DIAGNOSIS — R7303 Prediabetes: Secondary | ICD-10-CM | POA: Diagnosis not present

## 2023-05-17 DIAGNOSIS — E782 Mixed hyperlipidemia: Secondary | ICD-10-CM | POA: Diagnosis not present

## 2023-05-17 DIAGNOSIS — I2510 Atherosclerotic heart disease of native coronary artery without angina pectoris: Secondary | ICD-10-CM | POA: Diagnosis not present

## 2023-05-17 DIAGNOSIS — I10 Essential (primary) hypertension: Secondary | ICD-10-CM | POA: Diagnosis not present

## 2023-06-27 DIAGNOSIS — E782 Mixed hyperlipidemia: Secondary | ICD-10-CM | POA: Diagnosis not present

## 2023-06-27 DIAGNOSIS — I2510 Atherosclerotic heart disease of native coronary artery without angina pectoris: Secondary | ICD-10-CM | POA: Diagnosis not present

## 2023-06-27 DIAGNOSIS — R7303 Prediabetes: Secondary | ICD-10-CM | POA: Diagnosis not present

## 2023-06-27 DIAGNOSIS — I10 Essential (primary) hypertension: Secondary | ICD-10-CM | POA: Diagnosis not present

## 2023-06-30 DIAGNOSIS — E782 Mixed hyperlipidemia: Secondary | ICD-10-CM | POA: Diagnosis not present

## 2023-06-30 DIAGNOSIS — I10 Essential (primary) hypertension: Secondary | ICD-10-CM | POA: Diagnosis not present

## 2023-06-30 DIAGNOSIS — R7303 Prediabetes: Secondary | ICD-10-CM | POA: Diagnosis not present

## 2023-06-30 DIAGNOSIS — I2510 Atherosclerotic heart disease of native coronary artery without angina pectoris: Secondary | ICD-10-CM | POA: Diagnosis not present

## 2023-08-21 DIAGNOSIS — I2510 Atherosclerotic heart disease of native coronary artery without angina pectoris: Secondary | ICD-10-CM | POA: Diagnosis not present

## 2023-08-21 DIAGNOSIS — I10 Essential (primary) hypertension: Secondary | ICD-10-CM | POA: Diagnosis not present

## 2023-08-21 DIAGNOSIS — R7303 Prediabetes: Secondary | ICD-10-CM | POA: Diagnosis not present

## 2023-08-21 DIAGNOSIS — E782 Mixed hyperlipidemia: Secondary | ICD-10-CM | POA: Diagnosis not present

## 2023-09-26 DIAGNOSIS — E782 Mixed hyperlipidemia: Secondary | ICD-10-CM | POA: Diagnosis not present

## 2023-09-26 DIAGNOSIS — I2510 Atherosclerotic heart disease of native coronary artery without angina pectoris: Secondary | ICD-10-CM | POA: Diagnosis not present

## 2023-09-26 DIAGNOSIS — I10 Essential (primary) hypertension: Secondary | ICD-10-CM | POA: Diagnosis not present

## 2023-09-26 DIAGNOSIS — R7303 Prediabetes: Secondary | ICD-10-CM | POA: Diagnosis not present

## 2023-09-28 DIAGNOSIS — E7889 Other lipoprotein metabolism disorders: Secondary | ICD-10-CM | POA: Diagnosis not present

## 2023-09-28 DIAGNOSIS — I10 Essential (primary) hypertension: Secondary | ICD-10-CM | POA: Diagnosis not present

## 2023-09-28 DIAGNOSIS — R7303 Prediabetes: Secondary | ICD-10-CM | POA: Diagnosis not present

## 2023-09-28 DIAGNOSIS — M109 Gout, unspecified: Secondary | ICD-10-CM | POA: Diagnosis not present

## 2023-09-28 DIAGNOSIS — I2510 Atherosclerotic heart disease of native coronary artery without angina pectoris: Secondary | ICD-10-CM | POA: Diagnosis not present

## 2023-12-26 DIAGNOSIS — I10 Essential (primary) hypertension: Secondary | ICD-10-CM | POA: Diagnosis not present

## 2023-12-26 DIAGNOSIS — R7303 Prediabetes: Secondary | ICD-10-CM | POA: Diagnosis not present

## 2023-12-26 DIAGNOSIS — I2510 Atherosclerotic heart disease of native coronary artery without angina pectoris: Secondary | ICD-10-CM | POA: Diagnosis not present

## 2023-12-26 DIAGNOSIS — E782 Mixed hyperlipidemia: Secondary | ICD-10-CM | POA: Diagnosis not present

## 2024-01-11 DIAGNOSIS — I129 Hypertensive chronic kidney disease with stage 1 through stage 4 chronic kidney disease, or unspecified chronic kidney disease: Secondary | ICD-10-CM | POA: Diagnosis not present

## 2024-01-11 DIAGNOSIS — M109 Gout, unspecified: Secondary | ICD-10-CM | POA: Diagnosis not present

## 2024-01-11 DIAGNOSIS — N1832 Chronic kidney disease, stage 3b: Secondary | ICD-10-CM | POA: Diagnosis not present

## 2024-01-11 DIAGNOSIS — F325 Major depressive disorder, single episode, in full remission: Secondary | ICD-10-CM | POA: Diagnosis not present

## 2024-01-11 DIAGNOSIS — E785 Hyperlipidemia, unspecified: Secondary | ICD-10-CM | POA: Diagnosis not present

## 2024-01-11 DIAGNOSIS — Z823 Family history of stroke: Secondary | ICD-10-CM | POA: Diagnosis not present

## 2024-01-11 DIAGNOSIS — I2510 Atherosclerotic heart disease of native coronary artery without angina pectoris: Secondary | ICD-10-CM | POA: Diagnosis not present

## 2024-01-11 DIAGNOSIS — Z8249 Family history of ischemic heart disease and other diseases of the circulatory system: Secondary | ICD-10-CM | POA: Diagnosis not present

## 2024-01-11 DIAGNOSIS — E1122 Type 2 diabetes mellitus with diabetic chronic kidney disease: Secondary | ICD-10-CM | POA: Diagnosis not present

## 2024-02-29 DIAGNOSIS — R7303 Prediabetes: Secondary | ICD-10-CM | POA: Diagnosis not present

## 2024-02-29 DIAGNOSIS — I10 Essential (primary) hypertension: Secondary | ICD-10-CM | POA: Diagnosis not present

## 2024-02-29 DIAGNOSIS — E782 Mixed hyperlipidemia: Secondary | ICD-10-CM | POA: Diagnosis not present

## 2024-02-29 DIAGNOSIS — I2510 Atherosclerotic heart disease of native coronary artery without angina pectoris: Secondary | ICD-10-CM | POA: Diagnosis not present
